# Patient Record
Sex: Male | Born: 1935 | Race: White | Hispanic: No | Marital: Married | Smoking: Former smoker
Health system: Southern US, Community
[De-identification: ages and names within clinical notes are randomized; demographics above are authoritative.]

## PROBLEM LIST (undated history)

## (undated) DIAGNOSIS — M549 Dorsalgia, unspecified: Secondary | ICD-10-CM

## (undated) DIAGNOSIS — Z86718 Personal history of other venous thrombosis and embolism: Secondary | ICD-10-CM

## (undated) DIAGNOSIS — M1990 Unspecified osteoarthritis, unspecified site: Secondary | ICD-10-CM

## (undated) DIAGNOSIS — R350 Frequency of micturition: Secondary | ICD-10-CM

## (undated) DIAGNOSIS — D696 Thrombocytopenia, unspecified: Secondary | ICD-10-CM

## (undated) DIAGNOSIS — I639 Cerebral infarction, unspecified: Secondary | ICD-10-CM

## (undated) DIAGNOSIS — Z8619 Personal history of other infectious and parasitic diseases: Secondary | ICD-10-CM

## (undated) DIAGNOSIS — R011 Cardiac murmur, unspecified: Secondary | ICD-10-CM

## (undated) DIAGNOSIS — R600 Localized edema: Secondary | ICD-10-CM

## (undated) DIAGNOSIS — K5900 Constipation, unspecified: Secondary | ICD-10-CM

## (undated) DIAGNOSIS — R609 Edema, unspecified: Secondary | ICD-10-CM

## (undated) DIAGNOSIS — Z5189 Encounter for other specified aftercare: Secondary | ICD-10-CM

## (undated) DIAGNOSIS — K219 Gastro-esophageal reflux disease without esophagitis: Secondary | ICD-10-CM

## (undated) DIAGNOSIS — G8929 Other chronic pain: Secondary | ICD-10-CM

## (undated) DIAGNOSIS — K922 Gastrointestinal hemorrhage, unspecified: Secondary | ICD-10-CM

## (undated) DIAGNOSIS — Q211 Atrial septal defect: Secondary | ICD-10-CM

## (undated) DIAGNOSIS — Z9289 Personal history of other medical treatment: Secondary | ICD-10-CM

## (undated) DIAGNOSIS — N400 Enlarged prostate without lower urinary tract symptoms: Secondary | ICD-10-CM

## (undated) DIAGNOSIS — N289 Disorder of kidney and ureter, unspecified: Secondary | ICD-10-CM

## (undated) DIAGNOSIS — Z87442 Personal history of urinary calculi: Secondary | ICD-10-CM

## (undated) DIAGNOSIS — I8501 Esophageal varices with bleeding: Secondary | ICD-10-CM

## (undated) DIAGNOSIS — H269 Unspecified cataract: Secondary | ICD-10-CM

## (undated) HISTORY — PX: BACK SURGERY: SHX140

## (undated) HISTORY — PX: COLONOSCOPY: SHX174

## (undated) HISTORY — PX: ESOPHAGOGASTRODUODENOSCOPY: SHX1529

## (undated) HISTORY — PX: FRACTURE SURGERY: SHX138

## (undated) HISTORY — PX: OTHER SURGICAL HISTORY: SHX169

## (undated) HISTORY — PX: FOOT SURGERY: SHX648

---

## 1998-02-17 ENCOUNTER — Encounter: Admission: RE | Admit: 1998-02-17 | Discharge: 1998-05-18 | Payer: Self-pay | Admitting: Anesthesiology

## 1998-04-07 ENCOUNTER — Ambulatory Visit (HOSPITAL_BASED_OUTPATIENT_CLINIC_OR_DEPARTMENT_OTHER): Admission: RE | Admit: 1998-04-07 | Discharge: 1998-04-07 | Payer: Self-pay | Admitting: Orthopedic Surgery

## 1998-05-19 ENCOUNTER — Ambulatory Visit (HOSPITAL_BASED_OUTPATIENT_CLINIC_OR_DEPARTMENT_OTHER): Admission: RE | Admit: 1998-05-19 | Discharge: 1998-05-19 | Payer: Self-pay | Admitting: Orthopedic Surgery

## 1999-03-10 ENCOUNTER — Encounter: Payer: Self-pay | Admitting: Orthopedic Surgery

## 1999-03-10 ENCOUNTER — Ambulatory Visit (HOSPITAL_COMMUNITY): Admission: RE | Admit: 1999-03-10 | Discharge: 1999-03-10 | Payer: Self-pay | Admitting: Orthopedic Surgery

## 1999-03-24 ENCOUNTER — Ambulatory Visit (HOSPITAL_COMMUNITY): Admission: RE | Admit: 1999-03-24 | Discharge: 1999-03-24 | Payer: Self-pay | Admitting: Orthopedic Surgery

## 1999-03-24 ENCOUNTER — Encounter: Payer: Self-pay | Admitting: Orthopedic Surgery

## 1999-04-07 ENCOUNTER — Encounter: Payer: Self-pay | Admitting: Orthopedic Surgery

## 1999-04-07 ENCOUNTER — Ambulatory Visit (HOSPITAL_COMMUNITY): Admission: RE | Admit: 1999-04-07 | Discharge: 1999-04-07 | Payer: Self-pay | Admitting: Orthopedic Surgery

## 1999-08-16 ENCOUNTER — Encounter: Payer: Self-pay | Admitting: Specialist

## 1999-08-16 ENCOUNTER — Inpatient Hospital Stay (HOSPITAL_COMMUNITY): Admission: RE | Admit: 1999-08-16 | Discharge: 1999-08-19 | Payer: Self-pay | Admitting: Specialist

## 1999-10-29 ENCOUNTER — Encounter: Admission: RE | Admit: 1999-10-29 | Discharge: 1999-10-29 | Payer: Self-pay | Admitting: Specialist

## 1999-10-29 ENCOUNTER — Encounter: Payer: Self-pay | Admitting: Specialist

## 1999-11-10 ENCOUNTER — Encounter: Admission: RE | Admit: 1999-11-10 | Discharge: 1999-11-10 | Payer: Self-pay | Admitting: Specialist

## 1999-11-10 ENCOUNTER — Encounter: Payer: Self-pay | Admitting: Specialist

## 1999-12-23 ENCOUNTER — Ambulatory Visit: Admission: RE | Admit: 1999-12-23 | Discharge: 1999-12-23 | Payer: Self-pay | Admitting: Specialist

## 2000-01-06 ENCOUNTER — Ambulatory Visit (HOSPITAL_COMMUNITY): Admission: RE | Admit: 2000-01-06 | Discharge: 2000-01-06 | Payer: Self-pay | Admitting: *Deleted

## 2000-01-09 ENCOUNTER — Ambulatory Visit (HOSPITAL_COMMUNITY): Admission: RE | Admit: 2000-01-09 | Discharge: 2000-01-09 | Payer: Self-pay | Admitting: *Deleted

## 2000-01-09 ENCOUNTER — Encounter: Payer: Self-pay | Admitting: *Deleted

## 2000-02-10 ENCOUNTER — Inpatient Hospital Stay (HOSPITAL_COMMUNITY): Admission: RE | Admit: 2000-02-10 | Discharge: 2000-02-16 | Payer: Self-pay | Admitting: Specialist

## 2000-02-10 ENCOUNTER — Encounter: Payer: Self-pay | Admitting: Specialist

## 2000-02-16 ENCOUNTER — Inpatient Hospital Stay (HOSPITAL_COMMUNITY)
Admission: RE | Admit: 2000-02-16 | Discharge: 2000-02-21 | Payer: Self-pay | Admitting: Physical Medicine & Rehabilitation

## 2000-11-15 ENCOUNTER — Encounter: Admission: RE | Admit: 2000-11-15 | Discharge: 2000-11-15 | Payer: Self-pay | Admitting: Neurosurgery

## 2000-11-15 ENCOUNTER — Encounter: Payer: Self-pay | Admitting: Neurosurgery

## 2001-08-27 ENCOUNTER — Encounter: Payer: Self-pay | Admitting: Family Medicine

## 2001-08-27 ENCOUNTER — Ambulatory Visit (HOSPITAL_COMMUNITY): Admission: RE | Admit: 2001-08-27 | Discharge: 2001-08-27 | Payer: Self-pay | Admitting: Family Medicine

## 2001-09-04 ENCOUNTER — Encounter: Admission: RE | Admit: 2001-09-04 | Discharge: 2001-09-04 | Payer: Self-pay | Admitting: Gastroenterology

## 2001-09-04 ENCOUNTER — Encounter: Payer: Self-pay | Admitting: Gastroenterology

## 2003-06-11 ENCOUNTER — Encounter: Admission: RE | Admit: 2003-06-11 | Discharge: 2003-06-11 | Payer: Self-pay | Admitting: Family Medicine

## 2003-06-18 ENCOUNTER — Encounter: Admission: RE | Admit: 2003-06-18 | Discharge: 2003-06-18 | Payer: Self-pay | Admitting: Family Medicine

## 2005-06-07 ENCOUNTER — Inpatient Hospital Stay (HOSPITAL_COMMUNITY): Admission: RE | Admit: 2005-06-07 | Discharge: 2005-06-09 | Payer: Self-pay | Admitting: Specialist

## 2005-07-31 ENCOUNTER — Encounter: Admission: RE | Admit: 2005-07-31 | Discharge: 2005-07-31 | Payer: Self-pay | Admitting: Specialist

## 2005-10-20 ENCOUNTER — Inpatient Hospital Stay (HOSPITAL_COMMUNITY): Admission: RE | Admit: 2005-10-20 | Discharge: 2005-10-27 | Payer: Self-pay | Admitting: Specialist

## 2005-11-13 ENCOUNTER — Inpatient Hospital Stay (HOSPITAL_COMMUNITY): Admission: AD | Admit: 2005-11-13 | Discharge: 2005-11-19 | Payer: Self-pay | Admitting: Internal Medicine

## 2006-04-05 ENCOUNTER — Encounter: Admission: RE | Admit: 2006-04-05 | Discharge: 2006-04-05 | Payer: Self-pay | Admitting: Specialist

## 2006-05-09 ENCOUNTER — Encounter: Admission: RE | Admit: 2006-05-09 | Discharge: 2006-05-09 | Payer: Self-pay | Admitting: Specialist

## 2010-01-23 ENCOUNTER — Encounter: Payer: Self-pay | Admitting: Specialist

## 2010-09-21 ENCOUNTER — Other Ambulatory Visit: Payer: Self-pay | Admitting: Family Medicine

## 2010-09-21 DIAGNOSIS — R609 Edema, unspecified: Secondary | ICD-10-CM

## 2010-09-22 ENCOUNTER — Ambulatory Visit
Admission: RE | Admit: 2010-09-22 | Discharge: 2010-09-22 | Disposition: A | Discharge status: Home / Self Care | Payer: Medicare Other | Source: Ambulatory Visit | Attending: Family Medicine | Admitting: Family Medicine

## 2010-09-22 DIAGNOSIS — R609 Edema, unspecified: Secondary | ICD-10-CM

## 2011-01-03 HISTORY — PX: NECK SURGERY: SHX720

## 2011-02-06 ENCOUNTER — Telehealth: Payer: Self-pay | Admitting: Internal Medicine

## 2011-02-06 NOTE — Telephone Encounter (Signed)
Referred by Dr. Juluis Rainier Dx- Thrombocytopenia

## 2011-02-08 ENCOUNTER — Other Ambulatory Visit: Payer: Self-pay | Admitting: Orthopaedic Surgery

## 2011-02-08 DIAGNOSIS — M542 Cervicalgia: Secondary | ICD-10-CM

## 2011-02-13 ENCOUNTER — Ambulatory Visit
Admission: RE | Admit: 2011-02-13 | Discharge: 2011-02-13 | Disposition: A | Discharge status: Home / Self Care | Payer: Medicare Other | Source: Ambulatory Visit | Attending: Orthopaedic Surgery | Admitting: Orthopaedic Surgery

## 2011-02-13 DIAGNOSIS — M542 Cervicalgia: Secondary | ICD-10-CM

## 2011-02-22 ENCOUNTER — Other Ambulatory Visit: Payer: Medicare Other | Admitting: Lab

## 2011-02-22 ENCOUNTER — Ambulatory Visit: Payer: Medicare Other

## 2011-02-22 ENCOUNTER — Ambulatory Visit: Payer: Medicare Other | Admitting: Internal Medicine

## 2011-05-15 ENCOUNTER — Other Ambulatory Visit: Payer: Self-pay | Admitting: Orthopaedic Surgery

## 2011-05-15 DIAGNOSIS — M25519 Pain in unspecified shoulder: Secondary | ICD-10-CM

## 2011-05-22 ENCOUNTER — Ambulatory Visit
Admission: RE | Admit: 2011-05-22 | Discharge: 2011-05-22 | Disposition: A | Discharge status: Home / Self Care | Payer: Medicare Other | Source: Ambulatory Visit | Attending: Orthopaedic Surgery | Admitting: Orthopaedic Surgery

## 2011-05-22 DIAGNOSIS — M25519 Pain in unspecified shoulder: Secondary | ICD-10-CM

## 2011-05-31 ENCOUNTER — Other Ambulatory Visit (HOSPITAL_COMMUNITY): Payer: Self-pay | Admitting: Internal Medicine

## 2011-06-09 ENCOUNTER — Inpatient Hospital Stay (HOSPITAL_COMMUNITY): Admission: RE | Admit: 2011-06-09 | Payer: Medicare Other | Source: Ambulatory Visit

## 2011-06-09 ENCOUNTER — Ambulatory Visit (HOSPITAL_COMMUNITY): Payer: Medicare Other

## 2011-07-30 ENCOUNTER — Emergency Department (HOSPITAL_COMMUNITY): Payer: Medicare Other

## 2011-07-30 ENCOUNTER — Encounter (HOSPITAL_COMMUNITY): Payer: Self-pay

## 2011-07-30 ENCOUNTER — Observation Stay (HOSPITAL_COMMUNITY)
Admission: EM | Admit: 2011-07-30 | Discharge: 2011-08-01 | Disposition: A | Discharge status: Skilled Nursing Facility | Payer: Medicare Other | Attending: Internal Medicine | Admitting: Internal Medicine

## 2011-07-30 DIAGNOSIS — Z981 Arthrodesis status: Secondary | ICD-10-CM | POA: Insufficient documentation

## 2011-07-30 DIAGNOSIS — R600 Localized edema: Secondary | ICD-10-CM | POA: Diagnosis present

## 2011-07-30 DIAGNOSIS — R1310 Dysphagia, unspecified: Secondary | ICD-10-CM | POA: Insufficient documentation

## 2011-07-30 DIAGNOSIS — R269 Unspecified abnormalities of gait and mobility: Secondary | ICD-10-CM

## 2011-07-30 DIAGNOSIS — R609 Edema, unspecified: Secondary | ICD-10-CM | POA: Insufficient documentation

## 2011-07-30 DIAGNOSIS — M47812 Spondylosis without myelopathy or radiculopathy, cervical region: Secondary | ICD-10-CM | POA: Insufficient documentation

## 2011-07-30 DIAGNOSIS — S0001XA Abrasion of scalp, initial encounter: Secondary | ICD-10-CM

## 2011-07-30 DIAGNOSIS — N400 Enlarged prostate without lower urinary tract symptoms: Secondary | ICD-10-CM | POA: Diagnosis present

## 2011-07-30 DIAGNOSIS — W010XXA Fall on same level from slipping, tripping and stumbling without subsequent striking against object, initial encounter: Secondary | ICD-10-CM | POA: Insufficient documentation

## 2011-07-30 DIAGNOSIS — F17200 Nicotine dependence, unspecified, uncomplicated: Secondary | ICD-10-CM | POA: Insufficient documentation

## 2011-07-30 DIAGNOSIS — Y92009 Unspecified place in unspecified non-institutional (private) residence as the place of occurrence of the external cause: Secondary | ICD-10-CM | POA: Insufficient documentation

## 2011-07-30 DIAGNOSIS — K219 Gastro-esophageal reflux disease without esophagitis: Secondary | ICD-10-CM | POA: Diagnosis present

## 2011-07-30 DIAGNOSIS — W19XXXA Unspecified fall, initial encounter: Secondary | ICD-10-CM | POA: Diagnosis present

## 2011-07-30 DIAGNOSIS — S42309A Unspecified fracture of shaft of humerus, unspecified arm, initial encounter for closed fracture: Secondary | ICD-10-CM

## 2011-07-30 DIAGNOSIS — S42209A Unspecified fracture of upper end of unspecified humerus, initial encounter for closed fracture: Principal | ICD-10-CM | POA: Insufficient documentation

## 2011-07-30 DIAGNOSIS — IMO0002 Reserved for concepts with insufficient information to code with codable children: Secondary | ICD-10-CM | POA: Insufficient documentation

## 2011-07-30 HISTORY — DX: Unspecified osteoarthritis, unspecified site: M19.90

## 2011-07-30 HISTORY — DX: Encounter for other specified aftercare: Z51.89

## 2011-07-30 HISTORY — DX: Disorder of kidney and ureter, unspecified: N28.9

## 2011-07-30 LAB — CBC WITH DIFFERENTIAL/PLATELET
Basophils Absolute: 0 10*3/uL (ref 0.0–0.1)
Basophils Relative: 1 % (ref 0–1)
Eosinophils Absolute: 0.2 10*3/uL (ref 0.0–0.7)
Eosinophils Relative: 4 % (ref 0–5)
Lymphs Abs: 0.8 10*3/uL (ref 0.7–4.0)
MCH: 32.3 pg (ref 26.0–34.0)
MCV: 96.9 fL (ref 78.0–100.0)
Neutrophils Relative %: 58 % (ref 43–77)
Platelets: 90 10*3/uL — ABNORMAL LOW (ref 150–400)
RBC: 3.25 MIL/uL — ABNORMAL LOW (ref 4.22–5.81)
RDW: 12.9 % (ref 11.5–15.5)

## 2011-07-30 LAB — BASIC METABOLIC PANEL
Calcium: 8.9 mg/dL (ref 8.4–10.5)
GFR calc Af Amer: 75 mL/min — ABNORMAL LOW (ref >90)
GFR calc non Af Amer: 64 mL/min — ABNORMAL LOW (ref >90)
Glucose, Bld: 106 mg/dL — ABNORMAL HIGH (ref 70–99)
Potassium: 3.6 mEq/L (ref 3.5–5.1)
Sodium: 144 mEq/L (ref 135–145)

## 2011-07-30 LAB — URINALYSIS, ROUTINE W REFLEX MICROSCOPIC
Leukocytes, UA: NEGATIVE
Nitrite: NEGATIVE
Protein, ur: NEGATIVE mg/dL
Specific Gravity, Urine: 1.011 (ref 1.005–1.030)
Urobilinogen, UA: 1 mg/dL (ref 0.0–1.0)

## 2011-07-30 MED ORDER — MAGNESIUM HYDROXIDE 400 MG/5ML PO SUSP: 30.0000 mL | Freq: Every day | ORAL | Status: DC | PRN

## 2011-07-30 MED ORDER — ACETAMINOPHEN-CODEINE #3 300-30 MG PO TABS
1.0000 | ORAL_TABLET | ORAL | Status: DC | PRN
  Administered 2011-07-30 – 2011-07-31 (×2): 1 via ORAL
  Filled 2011-07-30 (×2): qty 1

## 2011-07-30 MED ORDER — GABAPENTIN 100 MG PO CAPS
100.0000 mg | ORAL_CAPSULE | Freq: Two times a day (BID) | ORAL | Status: DC
  Administered 2011-07-30 – 2011-08-01 (×5): 100 mg via ORAL
  Filled 2011-07-30 (×6): qty 1

## 2011-07-30 MED ORDER — FENTANYL CITRATE 0.05 MG/ML IJ SOLN
50.0000 ug | INTRAMUSCULAR | Status: DC | PRN
  Filled 2011-07-30: qty 2

## 2011-07-30 MED ORDER — B COMPLEX PO TABS: 1.0000 | ORAL_TABLET | Freq: Every day | ORAL | Status: DC

## 2011-07-30 MED ORDER — DOCUSATE SODIUM 100 MG PO CAPS
100.0000 mg | ORAL_CAPSULE | Freq: Two times a day (BID) | ORAL | Status: DC
  Administered 2011-07-30 – 2011-08-01 (×3): 100 mg via ORAL
  Filled 2011-07-30 (×4): qty 1

## 2011-07-30 MED ORDER — PANTOPRAZOLE SODIUM 40 MG PO TBEC
40.0000 mg | DELAYED_RELEASE_TABLET | Freq: Every day | ORAL | Status: DC
  Administered 2011-07-30 – 2011-08-01 (×3): 40 mg via ORAL
  Filled 2011-07-30 (×3): qty 1

## 2011-07-30 MED ORDER — MORPHINE SULFATE 2 MG/ML IJ SOLN
1.0000 mg | INTRAMUSCULAR | Status: DC | PRN
  Administered 2011-07-30 – 2011-07-31 (×4): 1 mg via INTRAVENOUS
  Filled 2011-07-30 (×4): qty 1

## 2011-07-30 MED ORDER — SODIUM CHLORIDE 0.9 % IV SOLN
INTRAVENOUS | Status: AC
  Administered 2011-07-30: 12:00:00 via INTRAVENOUS

## 2011-07-30 MED ORDER — SENNA 8.6 MG PO TABS
1.0000 | ORAL_TABLET | Freq: Two times a day (BID) | ORAL | Status: DC
  Administered 2011-07-30 – 2011-08-01 (×5): 8.6 mg via ORAL
  Filled 2011-07-30 (×6): qty 1

## 2011-07-30 MED ORDER — TAMSULOSIN HCL 0.4 MG PO CAPS
0.4000 mg | ORAL_CAPSULE | Freq: Every day | ORAL | Status: DC
  Administered 2011-07-30 – 2011-07-31 (×2): 0.4 mg via ORAL
  Filled 2011-07-30 (×3): qty 1

## 2011-07-30 MED ORDER — FUROSEMIDE 40 MG PO TABS
40.0000 mg | ORAL_TABLET | Freq: Every day | ORAL | Status: DC
  Administered 2011-07-30 – 2011-08-01 (×3): 40 mg via ORAL
  Filled 2011-07-30 (×3): qty 1

## 2011-07-30 MED ORDER — B COMPLEX-C PO TABS
1.0000 | ORAL_TABLET | Freq: Every day | ORAL | Status: DC
  Administered 2011-07-30 – 2011-08-01 (×3): 1 via ORAL
  Filled 2011-07-30 (×3): qty 1

## 2011-07-30 MED ORDER — HEPARIN SODIUM (PORCINE) 5000 UNIT/ML IJ SOLN
5000.0000 [IU] | Freq: Three times a day (TID) | INTRAMUSCULAR | Status: DC
  Administered 2011-07-30 – 2011-07-31 (×5): 5000 [IU] via SUBCUTANEOUS
  Filled 2011-07-30 (×9): qty 1

## 2011-07-30 MED ORDER — ONDANSETRON HCL 4 MG/2ML IJ SOLN: 4.0000 mg | Freq: Four times a day (QID) | INTRAMUSCULAR | Status: DC | PRN

## 2011-07-30 MED ORDER — ALUM & MAG HYDROXIDE-SIMETH 200-200-20 MG/5ML PO SUSP: 30.0000 mL | Freq: Four times a day (QID) | ORAL | Status: DC | PRN

## 2011-07-30 MED ORDER — ONDANSETRON HCL 4 MG PO TABS: 4.0000 mg | ORAL_TABLET | Freq: Four times a day (QID) | ORAL | Status: DC | PRN

## 2011-07-30 MED ORDER — FENTANYL CITRATE 0.05 MG/ML IJ SOLN
50.0000 ug | Freq: Once | INTRAMUSCULAR | Status: AC
  Administered 2011-07-30: 50 ug via INTRAVENOUS
  Filled 2011-07-30: qty 2

## 2011-07-30 NOTE — Consult Note (Signed)
Reason for Consult:  Right proximal humerus fracture Referring Physician:   [facility]  Brandon Hardy is an 78 y.o. male.  HPI:   78 yo right-handed male sustained an accidental mechanical fall today and injured his right shoulder.  He was recently discharged from a rehab facility and ambulates with a walker.  He has considerable right shoulder pain and denies other injuries.  His son is at the bedside.  He is admitted for therapy purposes given his difficulty with mobility.  Past Medical History  Diagnosis Date  . Arthritis   . Blood transfusion   . Renal disorder     Past Surgical History  Procedure Date  . Back surgery   . Fracture surgery     No family history on file.  Social History:  reports that he has been smoking.  He does not have any smokeless tobacco history on file. He reports that he does not drink alcohol or use illicit drugs.  Allergies: No Known Allergies  Medications: I have reviewed the patient's current medications.  Results for orders placed during the hospital encounter of 07/30/11 (from the past 48 hour(s))  CBC WITH DIFFERENTIAL     Status: Abnormal   Collection Time   07/30/11  8:04 AM      Component Value Range Comment   WBC 3.7 (*) 4.0 - 10.5 K/uL    RBC 3.25 (*) 4.22 - 5.81 MIL/uL    Hemoglobin 10.5 (*) 13.0 - 17.0 g/dL    HCT 29.5 (*) 62.1 - 52.0 %    MCV 96.9  78.0 - 100.0 fL    MCH 32.3  26.0 - 34.0 pg    MCHC 33.3  30.0 - 36.0 g/dL    RDW 30.8  65.7 - 84.6 %    Platelets 90 (*) 150 - 400 K/uL PLATELET COUNT CONFIRMED BY SMEAR   Neutrophils Relative 58  43 - 77 %    Neutro Abs 2.1  1.7 - 7.7 K/uL    Lymphocytes Relative 22  12 - 46 %    Lymphs Abs 0.8  0.7 - 4.0 K/uL    Monocytes Relative 15 (*) 3 - 12 %    Monocytes Absolute 0.6  0.1 - 1.0 K/uL    Eosinophils Relative 4  0 - 5 %    Eosinophils Absolute 0.2  0.0 - 0.7 K/uL    Basophils Relative 1  0 - 1 %    Basophils Absolute 0.0  0.0 - 0.1 K/uL   BASIC METABOLIC PANEL      Status: Abnormal   Collection Time   07/30/11  8:04 AM      Component Value Range Comment   Sodium 144  135 - 145 mEq/L    Potassium 3.6  3.5 - 5.1 mEq/L    Chloride 107  96 - 112 mEq/L    CO2 30  19 - 32 mEq/L    Glucose, Bld 106 (*) 70 - 99 mg/dL    BUN 10  6 - 23 mg/dL    Creatinine, Ser 9.62  0.50 - 1.35 mg/dL    Calcium 8.9  8.4 - 95.2 mg/dL    GFR calc non Af Amer 64 (*) >90 mL/min    GFR calc Af Amer 75 (*) >90 mL/min   URINALYSIS, ROUTINE W REFLEX MICROSCOPIC     Status: Normal   Collection Time   07/30/11 10:26 AM      Component Value Range Comment   Color, Urine YELLOW  YELLOW    APPearance CLEAR  CLEAR    Specific Gravity, Urine 1.011  1.005 - 1.030    pH 7.5  5.0 - 8.0    Glucose, UA NEGATIVE  NEGATIVE mg/dL    Hgb urine dipstick NEGATIVE  NEGATIVE    Bilirubin Urine NEGATIVE  NEGATIVE    Ketones, ur NEGATIVE  NEGATIVE mg/dL    Protein, ur NEGATIVE  NEGATIVE mg/dL    Urobilinogen, UA 1.0  0.0 - 1.0 mg/dL    Nitrite NEGATIVE  NEGATIVE    Leukocytes, UA NEGATIVE  NEGATIVE MICROSCOPIC NOT DONE ON URINES WITH NEGATIVE PROTEIN, BLOOD, LEUKOCYTES, NITRITE, OR GLUCOSE <1000 mg/dL.    Dg Chest 2 View  07/30/2011  *RADIOLOGY REPORT*  Clinical Data: Fall, shoulder pain  CHEST - 2 VIEW  Comparison: 11/09/2010  Findings: Borderline cardiomegaly again noted.  No acute infiltrate or pleural effusion.  No pulmonary edema.  Visualized bony structures are unremarkable.  IMPRESSION: No active disease.  No significant change.  Original Report Authenticated By: Natasha Mead, M.D.   Dg Pelvis 1-2 Views  07/30/2011  *RADIOLOGY REPORT*  Clinical Data: Shoulder pain, fall  PELVIS - 1-2 VIEW  Comparison: 01/07/2009  Findings: Single frontal view of the pelvis submitted.  No acute fracture or subluxation.  There is diffuse osteopenia.  Stable postsurgical changes of the lumbosacral spine.  IMPRESSION: Diffuse osteopenia.  No acute fracture or subluxation.  Original Report Authenticated By: Natasha Mead, M.D.   Dg Shoulder Right  07/30/2011  *RADIOLOGY REPORT*  Clinical Data: Fall, shoulder pain  RIGHT SHOULDER - 2+ VIEW  Comparison: None.  Findings: Two views of the right shoulder submitted.  There is mild displaced fracture with cortical step-off right humeral neck.  Mild degenerative changes right AC joint. Dystrophic calcifications are noted above distal right clavicle.  IMPRESSION: Mild displaced fracture right humeral neck.  Original Report Authenticated By: Natasha Mead, M.D.   Dg Elbow Complete Right  07/30/2011  *RADIOLOGY REPORT*  Clinical Data: Fall, shoulder pain  RIGHT ELBOW - COMPLETE 3+ VIEW  Comparison: None.  Findings: Four views of the right elbow submitted.  No acute fracture or subluxation.  No posterior fat pad sign.  IMPRESSION: No acute fracture or subluxation.  Original Report Authenticated By: Natasha Mead, M.D.   Ct Head Wo Contrast  07/30/2011  *RADIOLOGY REPORT*  Clinical Data:  78 year old male with fall with head and neck injury.  CT HEAD WITHOUT CONTRAST CT CERVICAL SPINE WITHOUT CONTRAST  Technique:  Multidetector CT imaging of the head and cervical spine was performed following the standard protocol without intravenous contrast.  Multiplanar CT image reconstructions of the cervical spine were also generated.  Comparison:  02/13/2011 cervical spine MRI.  CT HEAD  Findings: Mild to moderate atrophy and chronic small vessel white matter ischemic changes are noted.  No acute intracranial abnormalities are identified, including mass lesion or mass effect, hydrocephalus, extra-axial fluid collection, midline shift, hemorrhage, or acute infarction.  The visualized bony calvarium is unremarkable.  IMPRESSION: No evidence of acute intracranial abnormality.  Atrophy and chronic small vessel white matter ischemic changes.  CT CERVICAL SPINE  Findings: Anterior plate/screw fixation and interbody fusion material at C3-C4-C5-C6-C7 is noted. There is no evidence of acute fracture,  subluxation or prevertebral soft tissue swelling. No complicating hardware features are identified. Mild facet arthropathy throughout the cervical spine identified. This contributes to bony foraminal narrowing at several levels. No focal bony lesions are present.  IMPRESSION: No static evidence of acute injury  to the cervical spine.  Anterior/interbody fusion from C3-C7 without definite complicating features.  Multilevel facet arthropathy contributing to bony foraminal narrowing at several levels.  Original Report Authenticated By: Rosendo Gros, M.D.   Ct Cervical Spine Wo Contrast  07/30/2011  *RADIOLOGY REPORT*  Clinical Data:  78 year old male with fall with head and neck injury.  CT HEAD WITHOUT CONTRAST CT CERVICAL SPINE WITHOUT CONTRAST  Technique:  Multidetector CT imaging of the head and cervical spine was performed following the standard protocol without intravenous contrast.  Multiplanar CT image reconstructions of the cervical spine were also generated.  Comparison:  02/13/2011 cervical spine MRI.  CT HEAD  Findings: Mild to moderate atrophy and chronic small vessel white matter ischemic changes are noted.  No acute intracranial abnormalities are identified, including mass lesion or mass effect, hydrocephalus, extra-axial fluid collection, midline shift, hemorrhage, or acute infarction.  The visualized bony calvarium is unremarkable.  IMPRESSION: No evidence of acute intracranial abnormality.  Atrophy and chronic small vessel white matter ischemic changes.  CT CERVICAL SPINE  Findings: Anterior plate/screw fixation and interbody fusion material at C3-C4-C5-C6-C7 is noted. There is no evidence of acute fracture, subluxation or prevertebral soft tissue swelling. No complicating hardware features are identified. Mild facet arthropathy throughout the cervical spine identified. This contributes to bony foraminal narrowing at several levels. No focal bony lesions are present.  IMPRESSION: No static  evidence of acute injury to the cervical spine.  Anterior/interbody fusion from C3-C7 without definite complicating features.  Multilevel facet arthropathy contributing to bony foraminal narrowing at several levels.  Original Report Authenticated By: Rosendo Gros, M.D.   Dg Humerus Right  07/30/2011  *RADIOLOGY REPORT*  Clinical Data: Fall, shoulder pain  RIGHT HUMERUS - 2+ VIEW  Comparison: Right shoulder same day  Findings: Two views of the right humerus submitted.  There is mild displaced fracture proximal right humeral metaphysis.  IMPRESSION: Mild displaced fracture of proximal right humerus.  Original Report Authenticated By: Natasha Mead, M.D.    ROS Blood pressure 129/62, pulse 49, temperature 98 F (36.7 C), temperature source Oral, resp. rate 16, SpO2 100.00%. Physical Exam  Musculoskeletal:       Right shoulder: He exhibits decreased range of motion, bony tenderness and deformity.    Assessment/Plan: Right proximal humerus fracture with minimal displacement 1) sling for comfort; can flex/ext at elbow 2) PT/OT consults for mobility and ADL's; try platform walker  Brandon Hardy Y 07/30/2011, 1:46 PM

## 2011-07-30 NOTE — ED Provider Notes (Signed)
History     CSN: [id number]  Arrival date & time 07/30/11  0725   First MD Initiated Contact with Patient 07/30/11 [phone]      Chief Complaint  Patient presents with  . Fall  . Shoulder Pain    (Consider location/radiation/quality/duration/timing/severity/associated sxs/prior treatment) HPI Comments: Patient reports he was walking with his walker when he slipped and fell, injuring his right shoulder and the back of his head.  Pt reports he has a "hole in his head" but does not have pain in his head.  Only pain pt complains of is his right arm - he is unsure of the exact spot but notes it hurts when he moves.  Does note he occasionally has pain in his pelvis, "right in the middle."  Pain improved with fentanyl given by EMS.  Pt denies lightheadedness, dizziness, syncope, CP, SOB, abdominal pain, N/V/D, urinary symptoms.  Son reports this is patient's 2nd fall in three weeks since he has been home from the rehab facility.   Pt has hx low platelets.   Patient is a 78 y.o. male presenting with fall and shoulder pain. The history is provided by the patient and a relative.  Fall Pertinent negatives include no fever, no numbness, no abdominal pain, no nausea and no vomiting.  Shoulder Pain Pertinent negatives include no abdominal pain, chest pain, coughing, fever, nausea, neck pain, numbness, vomiting or weakness.    Past Medical History  Diagnosis Date  . Arthritis   . Blood transfusion   . Renal disorder     Past Surgical History  Procedure Date  . Back surgery   . Fracture surgery     No family history on file.  History  Substance Use Topics  . Smoking status: Current Everyday Smoker  . Smokeless tobacco: Not on file  . Alcohol Use: No      Review of Systems  Constitutional: Negative for fever.  HENT: Negative for neck pain.   Respiratory: Negative for cough and shortness of breath.   Cardiovascular: Negative for chest pain.  Gastrointestinal: Negative for nausea,  vomiting, abdominal pain and diarrhea.  Musculoskeletal: Negative for back pain.  Neurological: Negative for dizziness, weakness, light-headedness and numbness.  Psychiatric/Behavioral: Negative for confusion.  All other systems reviewed and are negative.    Allergies  Review of patient's allergies indicates not on file.  Home Medications  No current outpatient prescriptions on file.  BP 124/56  Pulse 54  Temp 97.8 F (36.6 C) (Oral)  Resp 14  SpO2 100%  Physical Exam  Nursing note and vitals reviewed. Constitutional: He appears well-developed and well-nourished. No distress.  HENT:  Head: Normocephalic. Head is with abrasion.  Neck: Neck supple.  Cardiovascular: Normal rate, regular rhythm and intact distal pulses.        Bilateral lower extremity edema.    Pulmonary/Chest: Effort normal and breath sounds normal. No respiratory distress. He has no wheezes. He has no rales. He exhibits no tenderness.  Abdominal: Soft. He exhibits no distension and no mass. There is no tenderness. There is no rebound and no guarding.  Musculoskeletal:       Right shoulder: He exhibits decreased range of motion.       Right elbow: He exhibits decreased range of motion. no tenderness found.       Right upper arm: He exhibits tenderness.       Right arm with decreased ROM secondary to pain.  Right humerus tender.  Distal pulses intact, sensation intact.  Neurological: He is alert. He has normal strength. No sensory deficit. He exhibits normal muscle tone. GCS eye subscore is 4. GCS verbal subscore is 5. GCS motor subscore is 6.       Moves arms and legs on command.  Lower extremities:  Strength 5/5, sensation intact.  Skin: He is not diaphoretic.  Psychiatric: He has a normal mood and affect.    ED Course  Procedures (including critical care time)  Labs Reviewed  CBC WITH DIFFERENTIAL - Abnormal; Notable for the following:    WBC 3.7 (*)     RBC 3.25 (*)     Hemoglobin 10.5 (*)     HCT  31.5 (*)     Platelets 90 (*)  PLATELET COUNT CONFIRMED BY SMEAR   Monocytes Relative 15 (*)     All other components within normal limits  BASIC METABOLIC PANEL - Abnormal; Notable for the following:    Glucose, Bld 106 (*)     GFR calc non Af Amer 64 (*)     GFR calc Af Amer 75 (*)     All other components within normal limits  URINALYSIS, ROUTINE W REFLEX MICROSCOPIC  URINE CULTURE   Dg Chest 2 View  07/30/2011  *RADIOLOGY REPORT*  Clinical Data: Fall, shoulder pain  CHEST - 2 VIEW  Comparison: 11/09/2010  Findings: Borderline cardiomegaly again noted.  No acute infiltrate or pleural effusion.  No pulmonary edema.  Visualized bony structures are unremarkable.  IMPRESSION: No active disease.  No significant change.  Original Report Authenticated By: Natasha Mead, M.D.   Dg Pelvis 1-2 Views  07/30/2011  *RADIOLOGY REPORT*  Clinical Data: Shoulder pain, fall  PELVIS - 1-2 VIEW  Comparison: 01/07/2009  Findings: Single frontal view of the pelvis submitted.  No acute fracture or subluxation.  There is diffuse osteopenia.  Stable postsurgical changes of the lumbosacral spine.  IMPRESSION: Diffuse osteopenia.  No acute fracture or subluxation.  Original Report Authenticated By: Natasha Mead, M.D.   Dg Shoulder Right  07/30/2011  *RADIOLOGY REPORT*  Clinical Data: Fall, shoulder pain  RIGHT SHOULDER - 2+ VIEW  Comparison: None.  Findings: Two views of the right shoulder submitted.  There is mild displaced fracture with cortical step-off right humeral neck.  Mild degenerative changes right AC joint. Dystrophic calcifications are noted above distal right clavicle.  IMPRESSION: Mild displaced fracture right humeral neck.  Original Report Authenticated By: Natasha Mead, M.D.   Dg Elbow Complete Right  07/30/2011  *RADIOLOGY REPORT*  Clinical Data: Fall, shoulder pain  RIGHT ELBOW - COMPLETE 3+ VIEW  Comparison: None.  Findings: Four views of the right elbow submitted.  No acute fracture or subluxation.  No  posterior fat pad sign.  IMPRESSION: No acute fracture or subluxation.  Original Report Authenticated By: Natasha Mead, M.D.   Ct Head Wo Contrast  07/30/2011  *RADIOLOGY REPORT*  Clinical Data:  78 year old male with fall with head and neck injury.  CT HEAD WITHOUT CONTRAST CT CERVICAL SPINE WITHOUT CONTRAST  Technique:  Multidetector CT imaging of the head and cervical spine was performed following the standard protocol without intravenous contrast.  Multiplanar CT image reconstructions of the cervical spine were also generated.  Comparison:  02/13/2011 cervical spine MRI.  CT HEAD  Findings: Mild to moderate atrophy and chronic small vessel white matter ischemic changes are noted.  No acute intracranial abnormalities are identified, including mass lesion or mass effect, hydrocephalus, extra-axial fluid collection, midline shift, hemorrhage, or acute infarction.  The visualized  bony calvarium is unremarkable.  IMPRESSION: No evidence of acute intracranial abnormality.  Atrophy and chronic small vessel white matter ischemic changes.  CT CERVICAL SPINE  Findings: Anterior plate/screw fixation and interbody fusion material at C3-C4-C5-C6-C7 is noted. There is no evidence of acute fracture, subluxation or prevertebral soft tissue swelling. No complicating hardware features are identified. Mild facet arthropathy throughout the cervical spine identified. This contributes to bony foraminal narrowing at several levels. No focal bony lesions are present.  IMPRESSION: No static evidence of acute injury to the cervical spine.  Anterior/interbody fusion from C3-C7 without definite complicating features.  Multilevel facet arthropathy contributing to bony foraminal narrowing at several levels.  Original Report Authenticated By: Rosendo Gros, M.D.   Ct Cervical Spine Wo Contrast  07/30/2011  *RADIOLOGY REPORT*  Clinical Data:  78 year old male with fall with head and neck injury.  CT HEAD WITHOUT CONTRAST CT CERVICAL  SPINE WITHOUT CONTRAST  Technique:  Multidetector CT imaging of the head and cervical spine was performed following the standard protocol without intravenous contrast.  Multiplanar CT image reconstructions of the cervical spine were also generated.  Comparison:  02/13/2011 cervical spine MRI.  CT HEAD  Findings: Mild to moderate atrophy and chronic small vessel white matter ischemic changes are noted.  No acute intracranial abnormalities are identified, including mass lesion or mass effect, hydrocephalus, extra-axial fluid collection, midline shift, hemorrhage, or acute infarction.  The visualized bony calvarium is unremarkable.  IMPRESSION: No evidence of acute intracranial abnormality.  Atrophy and chronic small vessel white matter ischemic changes.  CT CERVICAL SPINE  Findings: Anterior plate/screw fixation and interbody fusion material at C3-C4-C5-C6-C7 is noted. There is no evidence of acute fracture, subluxation or prevertebral soft tissue swelling. No complicating hardware features are identified. Mild facet arthropathy throughout the cervical spine identified. This contributes to bony foraminal narrowing at several levels. No focal bony lesions are present.  IMPRESSION: No static evidence of acute injury to the cervical spine.  Anterior/interbody fusion from C3-C7 without definite complicating features.  Multilevel facet arthropathy contributing to bony foraminal narrowing at several levels.  Original Report Authenticated By: Rosendo Gros, M.D.   Dg Humerus Right  07/30/2011  *RADIOLOGY REPORT*  Clinical Data: Fall, shoulder pain  RIGHT HUMERUS - 2+ VIEW  Comparison: Right shoulder same day  Findings: Two views of the right humerus submitted.  There is mild displaced fracture proximal right humeral metaphysis.  IMPRESSION: Mild displaced fracture of proximal right humerus.  Original Report Authenticated By: Natasha Mead, M.D.    7:48 AM Pt seen and examined, labs, xrays, CTs ordered.  Pt declines pain  medication at this time.    7:54 AM Dr Lorenso Courier made aware of patient.    1. Dysphagia   2. Fall   3. Humeral fracture   4. Scalp abrasion       MDM  Pt with slip and fall at home.  Pt walks with a walker.  Sustained right humeral fracture that is nonsurgical but pt will not be able to ambulate with walker given this break.  Labs otherwise show known pancytopenia.  Patient unable to manage at home given this situation.  I spoke with Dr Magnus Ivan, orthopedics, who recommends sling and follow up in the office in 1-2 weeks.  I also spoke with the social worker French Ana, who will begin working on placement.  States that pt needs inpatient admission and PT/OT consult for recommendations.  I then admitted the patient to [facility] and have  discussed these other conversations with the hospitalist who will admit the patient.            [place], [place] 07/30/11 1605

## 2011-07-30 NOTE — Progress Notes (Signed)
Orthopedic Tech Progress Note Patient Details:  Brandon Hardy April 16, 1927 [id number]  Ortho Devices Type of Ortho Device: Arm foam sling Ortho Device/Splint Interventions: Application   Cammer, Mickie Bail 07/30/2011, 10:12 AM

## 2011-07-30 NOTE — ED Provider Notes (Signed)
History     CSN: [id number]  Arrival date & time 07/30/11  0725   First MD Initiated Contact with Patient 07/30/11 [phone]      Chief Complaint  Patient presents with  . Fall  . Shoulder Pain    (Consider location/radiation/quality/duration/timing/severity/associated sxs/prior treatment) HPI  Past Medical History  Diagnosis Date  . Arthritis   . Blood transfusion   . Renal disorder     Past Surgical History  Procedure Date  . Back surgery   . Fracture surgery     No family history on file.  History  Substance Use Topics  . Smoking status: Current Everyday Smoker  . Smokeless tobacco: Not on file  . Alcohol Use: No      Review of Systems  Allergies  Review of patient's allergies indicates no known allergies.  Home Medications   Current Outpatient Rx  Name Route Sig Dispense Refill  . ACETAMINOPHEN-CODEINE #3 300-30 MG PO TABS Oral Take 1 tablet by mouth every 4 (four) hours as needed. For pain    . B COMPLEX PO TABS Oral Take 1 tablet by mouth daily.    . FUROSEMIDE 40 MG PO TABS Oral Take 40 mg by mouth daily.    Marland Kitchen GABAPENTIN 100 MG PO CAPS Oral Take 100 mg by mouth 2 (two) times daily.    Marland Kitchen OMEPRAZOLE 20 MG PO CPDR Oral Take 20 mg by mouth daily.    Marland Kitchen TAMSULOSIN HCL 0.4 MG PO CAPS Oral Take by mouth daily.      BP 124/56  Pulse 54  Temp 97.8 F (36.6 C) (Oral)  Resp 14  SpO2 100%  Physical Exam  ED Course  Procedures (including critical care time)   Labs Reviewed  CBC WITH DIFFERENTIAL  BASIC METABOLIC PANEL  URINALYSIS, ROUTINE W REFLEX MICROSCOPIC   No results found.   No diagnosis found.    MDM  0800.  Pt is being evaluated primarily by PA. West.  I obtained history and performed physical exam also.  Mr. Deremer describes a mechanical fall this morning while walking from the bathroom.  He complains mostly of pain in his rt arm/shoulder and fears it may be broken.  On exam he is alert, oriented, and pleasant - no distress noted.  He  has reproducible rt proximal humerus pain.  There is a ccollar in place and a small occipital wound is noted.  Although he has no neck pain, will await imaging and perform serial exams prior to removing collar.  There are also pending routine screening labs.        Tobin Chad, MD 07/30/11 [phone]

## 2011-07-30 NOTE — ED Notes (Signed)
Initially charted that pt had a lac to the back of the head.  Upon inspection there is no lac.

## 2011-07-30 NOTE — H&P (Signed)
[facility]                                                                                    Patient Demographics  Brandon Hardy, is a 78 y.o. male  CSN: [id number]  MRN: [id number]  DOB - 1927-11-02  Admit Date - 07/30/2011  Outpatient Primary MD for the patient is Gaye Alken, MD   With History of -  Past Medical History  Diagnosis Date  . Arthritis   . Blood transfusion   . Renal disorder       Past Surgical History  Procedure Date  . Back surgery   . Fracture surgery     in for   Chief Complaint  Patient presents with  . Fall  . Shoulder Pain     HPI  Brandon Hardy  is a 78 y.o. male, who was discharged recently from skilled nursing facility, where he was for subacute rehabilitation for total of 3 months secondary to recent surgical surgery, patient has been discharged on home with a walker, and reports he fell today in the bathroom, patient reports he tripped on the worker, denies any altered mental status loss of consciousness near syncope syncope lightheadedness or dizziness preceded the fall, patient reports he fell at his back where he hit his right side where he started complaining of right shoulder pain since then and had some mild laceration in the scalp. In ED patient had air CT head and neck which did not show any acute fracture or subluxation, is right shoulder x-ray did show mildly displaced humerus fracture, hospitalist service we are requested to admit the patient for for need of placement as patient can't use his walker with a fractured humerus, as well for orthopedic evaluation for management of his fracture.    Review of Systems    In addition to the HPI above,  No Fever-chills, recent fall No Headache, No changes with Vision or hearing, No problems swallowing food or Liquids, No Chest pain, Cough or Shortness of Breath, No Abdominal pain, No Nausea or Vommitting, Bowel movements are regular, No Blood in stool or  Urine, No dysuria, No new skin rashes or bruises, No new joints pains-aches, complaints of right arm pain and. No new weakness, tingling, numbness in any extremity, No recent weight gain or loss, No polyuria, polydypsia or polyphagia, No significant Mental Stressors.  A full 10 point Review of Systems was done, except as stated above, all other Review of Systems were negative.   Social History No hx of ETOH use, quit smoking 35 years ago.  Family History No family history on file.   Prior to Admission medications   Medication Sig Start Date End Date Taking? Authorizing Provider  acetaminophen-codeine (TYLENOL #3) 300-30 MG per tablet Take 1 tablet by mouth every 4 (four) hours as needed. For pain   Yes Historical Provider, MD  b complex vitamins tablet Take 1 tablet by mouth daily.   Yes Historical Provider, MD  furosemide (LASIX) 40 MG tablet Take 40 mg by mouth daily.   Yes Historical Provider, MD  gabapentin (NEURONTIN) 100 MG capsule Take 100 mg by mouth 2 (two) times daily.  Yes Historical Provider, MD  omeprazole (PRILOSEC) 20 MG capsule Take 20 mg by mouth daily.   Yes Historical Provider, MD  Tamsulosin HCl (FLOMAX) 0.4 MG CAPS Take by mouth daily.   Yes Historical Provider, MD    No Known Allergies  Physical Exam  Vitals  Blood pressure 116/46, pulse 55, temperature 97.8 F (36.6 C), temperature source Oral, resp. rate 16, SpO2 99.00%.   1. General elderly male lying in bed in NAD,  2. Normal affect and insight, Not Suicidal or Homicidal, Awake Alert, Oriented X 3.  3. No F.N deficits, ALL C.Nerves Intact, Strength 5/5 all 4 extremities, but limited in right upper extremity due to pain, Sensation intact all 4 extremities, Plantars down going.  4. Ears and Eyes appear Normal, Conjunctivae clear, PERRLA. Moist Oral Mucosa.  5. Supple Neck, No JVD, No cervical lymphadenopathy appriciated, No Carotid Bruits.  6. Symmetrical Chest wall movement, Good air movement  bilaterally, CTAB.  7. RRR, No Gallops, Rubs or Murmurs, No Parasternal Heave.  8. Positive Bowel Sounds, Abdomen Soft, Non tender, No organomegaly appriciated,No rebound -guarding or rigidity.  9.  No Cyanosis, Normal Skin Turgor, No Skin Rash or Bruise.  10. Good muscle tone,  joints appear normal , no effusions, bilateral lower extremity edema +1 to +2, didn't decreased range of motion in right arm secondary to numerous fractures, has a sling.  11. No Palpable Lymph Nodes in Neck or Axillae   Data Review  CBC  Lab 07/30/11 0804  WBC 3.7*  HGB 10.5*  HCT 31.5*  PLT 90*  MCV 96.9  MCH 32.3  MCHC 33.3  RDW 12.9  LYMPHSABS 0.8  MONOABS 0.6  EOSABS 0.2  BASOSABS 0.0  BANDABS --   ------------------------------------------------------------------------------------------------------------------  Chemistries   Lab 07/30/11 0804  NA 144  K 3.6  CL 107  CO2 30  GLUCOSE 106*  BUN 10  CREATININE 1.05  CALCIUM 8.9  MG --  AST --  ALT --  ALKPHOS --  BILITOT --   ------------------------------------------------------------------------------------------------------------------ CrCl is unknown because there is no height on file for the current visit. ------------------------------------------------------------------------------------------------------------------ No results found for this basename: TSH,T4TOTAL,FREET3,T3FREE,THYROIDAB in the last 72 hours   Coagulation profile No results found for this basename: INR:5,PROTIME:5 in the last 168 hours ------------------------------------------------------------------------------------------------------------------- No results found for this basename: DDIMER:2 in the last 72 hours -------------------------------------------------------------------------------------------------------------------  Cardiac Enzymes No results found for this basename: CK:3,CKMB:3,TROPONINI:3,MYOGLOBIN:3 in the last 168  hours ------------------------------------------------------------------------------------------------------------------ No components found with this basename: POCBNP:3   ---------------------------------------------------------------------------------------------------------------  Urinalysis    Component Value Date/Time   COLORURINE YELLOW 07/30/2011 1026   APPEARANCEUR CLEAR 07/30/2011 1026   LABSPEC 1.011 07/30/2011 1026   PHURINE 7.5 07/30/2011 1026   GLUCOSEU NEGATIVE 07/30/2011 1026   HGBUR NEGATIVE 07/30/2011 1026   BILIRUBINUR NEGATIVE 07/30/2011 1026   KETONESUR NEGATIVE 07/30/2011 1026   PROTEINUR NEGATIVE 07/30/2011 1026   UROBILINOGEN 1.0 07/30/2011 1026   NITRITE NEGATIVE 07/30/2011 1026   LEUKOCYTESUR NEGATIVE 07/30/2011 1026    ----------------------------------------------------------------------------------------------------------------    Imaging results:   Dg Chest 2 View  07/30/2011  *RADIOLOGY REPORT*  Clinical Data: Fall, shoulder pain  CHEST - 2 VIEW  Comparison: 11/09/2010  Findings: Borderline cardiomegaly again noted.  No acute infiltrate or pleural effusion.  No pulmonary edema.  Visualized bony structures are unremarkable.  IMPRESSION: No active disease.  No significant change.  Original Report Authenticated By: Natasha Mead, M.D.   Dg Pelvis 1-2 Views  07/30/2011  *RADIOLOGY REPORT*  Clinical Data:  Shoulder pain, fall  PELVIS - 1-2 VIEW  Comparison: 01/07/2009  Findings: Single frontal view of the pelvis submitted.  No acute fracture or subluxation.  There is diffuse osteopenia.  Stable postsurgical changes of the lumbosacral spine.  IMPRESSION: Diffuse osteopenia.  No acute fracture or subluxation.  Original Report Authenticated By: Natasha Mead, M.D.   Dg Shoulder Right  07/30/2011  *RADIOLOGY REPORT*  Clinical Data: Fall, shoulder pain  RIGHT SHOULDER - 2+ VIEW  Comparison: None.  Findings: Two views of the right shoulder submitted.  There is mild displaced  fracture with cortical step-off right humeral neck.  Mild degenerative changes right AC joint. Dystrophic calcifications are noted above distal right clavicle.  IMPRESSION: Mild displaced fracture right humeral neck.  Original Report Authenticated By: Natasha Mead, M.D.   Dg Elbow Complete Right  07/30/2011  *RADIOLOGY REPORT*  Clinical Data: Fall, shoulder pain  RIGHT ELBOW - COMPLETE 3+ VIEW  Comparison: None.  Findings: Four views of the right elbow submitted.  No acute fracture or subluxation.  No posterior fat pad sign.  IMPRESSION: No acute fracture or subluxation.  Original Report Authenticated By: Natasha Mead, M.D.   Ct Head Wo Contrast  07/30/2011  *RADIOLOGY REPORT*  Clinical Data:  78 year old male with fall with head and neck injury.  CT HEAD WITHOUT CONTRAST CT CERVICAL SPINE WITHOUT CONTRAST  Technique:  Multidetector CT imaging of the head and cervical spine was performed following the standard protocol without intravenous contrast.  Multiplanar CT image reconstructions of the cervical spine were also generated.  Comparison:  02/13/2011 cervical spine MRI.  CT HEAD  Findings: Mild to moderate atrophy and chronic small vessel white matter ischemic changes are noted.  No acute intracranial abnormalities are identified, including mass lesion or mass effect, hydrocephalus, extra-axial fluid collection, midline shift, hemorrhage, or acute infarction.  The visualized bony calvarium is unremarkable.  IMPRESSION: No evidence of acute intracranial abnormality.  Atrophy and chronic small vessel white matter ischemic changes.  CT CERVICAL SPINE  Findings: Anterior plate/screw fixation and interbody fusion material at C3-C4-C5-C6-C7 is noted. There is no evidence of acute fracture, subluxation or prevertebral soft tissue swelling. No complicating hardware features are identified. Mild facet arthropathy throughout the cervical spine identified. This contributes to bony foraminal narrowing at several levels. No  focal bony lesions are present.  IMPRESSION: No static evidence of acute injury to the cervical spine.  Anterior/interbody fusion from C3-C7 without definite complicating features.  Multilevel facet arthropathy contributing to bony foraminal narrowing at several levels.  Original Report Authenticated By: Rosendo Gros, M.D.   Ct Cervical Spine Wo Contrast  07/30/2011  *RADIOLOGY REPORT*  Clinical Data:  77 year old male with fall with head and neck injury.  CT HEAD WITHOUT CONTRAST CT CERVICAL SPINE WITHOUT CONTRAST  Technique:  Multidetector CT imaging of the head and cervical spine was performed following the standard protocol without intravenous contrast.  Multiplanar CT image reconstructions of the cervical spine were also generated.  Comparison:  02/13/2011 cervical spine MRI.  CT HEAD  Findings: Mild to moderate atrophy and chronic small vessel white matter ischemic changes are noted.  No acute intracranial abnormalities are identified, including mass lesion or mass effect, hydrocephalus, extra-axial fluid collection, midline shift, hemorrhage, or acute infarction.  The visualized bony calvarium is unremarkable.  IMPRESSION: No evidence of acute intracranial abnormality.  Atrophy and chronic small vessel white matter ischemic changes.  CT CERVICAL SPINE  Findings: Anterior plate/screw fixation and interbody fusion material at C3-C4-C5-C6-C7 is noted.  There is no evidence of acute fracture, subluxation or prevertebral soft tissue swelling. No complicating hardware features are identified. Mild facet arthropathy throughout the cervical spine identified. This contributes to bony foraminal narrowing at several levels. No focal bony lesions are present.  IMPRESSION: No static evidence of acute injury to the cervical spine.  Anterior/interbody fusion from C3-C7 without definite complicating features.  Multilevel facet arthropathy contributing to bony foraminal narrowing at several levels.  Original Report  Authenticated By: Rosendo Gros, M.D.   Dg Humerus Right  07/30/2011  *RADIOLOGY REPORT*  Clinical Data: Fall, shoulder pain  RIGHT HUMERUS - 2+ VIEW  Comparison: Right shoulder same day  Findings: Two views of the right humerus submitted.  There is mild displaced fracture proximal right humeral metaphysis.  IMPRESSION: Mild displaced fracture of proximal right humerus.  Original Report Authenticated By: Natasha Mead, M.D.    Assessment & Plan  Principal Problem:  *Fall Active Problems:  Gait disturbance  Humerus fracture  GERD (gastroesophageal reflux disease)  BPH (benign prostatic hyperplasia)  Leg edema    1. Fall. This is secondary to unsteady gait, patient recently discharged  from subacute rehabilitation, using a walker, will consult physical therapy and social worker, may need to be discharged to SNF as he was be able to use his walker with his fractured humers.  2. Humerus fracture. Consultants orthopedic he will evaluate the patient today meanwhile continue with a sling.  3. GERD. continue with proton  4. BPH. Continue with Flomax  5. Leg edema. This is chronic continue with Lasix   DVT Prophylaxis Heparin  AM Labs Ordered, also please review Full Orders  Family Communication: Admission, patients condition and plan of care including tests being ordered have been discussed with the patient  who indicate understanding and agree with the plan and Code Status.  Code Status full  Disposition Plan: SNF  Time spent in minutes :  Condition Vanessa St. John, Loyed Wilmes M.D on 07/30/2011 at 11:23 AM  [facility] Office  [phone]

## 2011-07-30 NOTE — ED Notes (Signed)
Per EMS, pt fell while in bathroom.  Pt with recent C4 - C7 surgery with plate.  Pt also states several back surgeries.  Pt with hx of low platelets.  Pt c/o right shoulder pain.  Pt with small laceration to back left side of head.  75 mcg Fentanyl given by EMS.

## 2011-07-31 DIAGNOSIS — W19XXXA Unspecified fall, initial encounter: Secondary | ICD-10-CM

## 2011-07-31 DIAGNOSIS — S42309A Unspecified fracture of shaft of humerus, unspecified arm, initial encounter for closed fracture: Secondary | ICD-10-CM

## 2011-07-31 DIAGNOSIS — K219 Gastro-esophageal reflux disease without esophagitis: Secondary | ICD-10-CM

## 2011-07-31 DIAGNOSIS — N400 Enlarged prostate without lower urinary tract symptoms: Secondary | ICD-10-CM

## 2011-07-31 DIAGNOSIS — R269 Unspecified abnormalities of gait and mobility: Secondary | ICD-10-CM

## 2011-07-31 DIAGNOSIS — R1310 Dysphagia, unspecified: Secondary | ICD-10-CM

## 2011-07-31 LAB — BASIC METABOLIC PANEL
Chloride: 109 mEq/L (ref 96–112)
Creatinine, Ser: 0.98 mg/dL (ref 0.50–1.35)
GFR calc Af Amer: 87 mL/min — ABNORMAL LOW (ref >90)
GFR calc non Af Amer: 75 mL/min — ABNORMAL LOW (ref >90)
Potassium: 3.7 mEq/L (ref 3.5–5.1)

## 2011-07-31 LAB — CBC
Platelets: 85 10*3/uL — ABNORMAL LOW (ref 150–400)
RDW: 13.2 % (ref 11.5–15.5)
WBC: 3.3 10*3/uL — ABNORMAL LOW (ref 4.0–10.5)

## 2011-07-31 LAB — URINE CULTURE

## 2011-07-31 MED ORDER — OXYCODONE-ACETAMINOPHEN 5-325 MG PO TABS
1.0000 | ORAL_TABLET | ORAL | Status: DC | PRN
  Administered 2011-07-31 – 2011-08-01 (×4): 2 via ORAL
  Filled 2011-07-31 (×4): qty 2

## 2011-07-31 MED ORDER — MORPHINE SULFATE 2 MG/ML IJ SOLN
2.0000 mg | Freq: Every day | INTRAMUSCULAR | Status: DC | PRN
  Administered 2011-08-01: 2 mg via INTRAVENOUS
  Filled 2011-07-31: qty 1

## 2011-07-31 NOTE — Clinical Social Work Psychosocial (Signed)
     Clinical Social Work Department BRIEF PSYCHOSOCIAL ASSESSMENT 07/31/2011  Patient:  Brandon Hardy, Brandon Hardy     Account Number:  [id number]     Admit date:  07/30/2011  Clinical Social Worker:  Burnard Hawthorne  Date/Time:  07/31/2011 04:44 PM  Referred by:  Physician  Date Referred:  07/31/2011 Referred for  SNF Placement   Other Referral:   Interview type:  Patient Other interview type:    PSYCHOSOCIAL DATA Living Status:  WIFE Admitted from facility:   Level of care:   Primary support name:  Woodie Degraffenreid   [phone] Primary support relationship to patient:  SPOUSE Degree of support available:   Good support    CURRENT CONCERNS Current Concerns  Post-Acute Placement   Other Concerns:   Patient resided at [facility] until he returned home 3 weeks ago.    SOCIAL WORK ASSESSMENT / PLAN CSW referred to assist this 78 year old male with short term SNF. He was recently d/c'd from [facility] (approx. 3 weeks ago) and returned home with his wife. Patient has had multple falls at home since d/c from SNF.  Physical Therapy is now recommended SNF placement again. Patient wants to return to [facility]; spoke with Lowella Bandy- Admissions- she stated that patient has 18 insurance days left in the SNF; if he needs SNF beyond that- will either have to be self pay, apply for Medicaid or return home.  They will offer a bed for patient tomorrow.  FL2 on chart for MD's signature.   Assessment/plan status:  Psychosocial Support/Ongoing Assessment of Needs Other assessment/ plan:   Information/referral to community resources:   SNF list given to patient; discussed after care options including  home health, DME and private duty services.    PATIENTS/FAMILYS RESPONSE TO PLAN OF CARE: Patient is alert and oriented; he is agreeable to SNF placement and wanted placement at [facility].

## 2011-07-31 NOTE — Evaluation (Addendum)
Physical Therapy Evaluation Patient Details Name: Brandon Hardy MRN: [id number] DOB: 12-Feb-1927 Today's Date: 07/31/2011 Time: 4098-1191 PT Time Calculation (min): 26 min  PT Assessment / Plan / Recommendation Clinical Impression  Pt is 78 yo male with right humeral fx s/p fall.  Patient unable to ambulate safely without assistance and is not a candidate for use of rolling walker or platform walker secondary to RUE fx and increased pain.  Patient presents with deficits in balance and functional mobility.  Pt will benefit from continued skilled physical therapy to increase mobility and safety.  Recommend discharge to SNF for continued rehab.    PT Assessment  Patient needs continued PT services    Follow Up Recommendations  Skilled nursing facility    Barriers to Discharge Decreased caregiver support Patient will require 24 hr assistance upon discharge    Equipment Recommendations       Recommendations for Other Services     Frequency Min 5X/week    Precautions / Restrictions Precautions Required Braces or Orthoses: Other Brace/Splint Other Brace/Splint: Right Shoulder Sling   Pertinent Vitals/Pain 2/10 pre activity to 9/10 during activity      Mobility  Transfers Transfers: Sit to Stand;Stand to Sit Sit to Stand: 3: Mod assist;From chair/3-in-1;With upper extremity assist Stand to Sit: 3: Mod assist;With upper extremity assist;To chair/3-in-1 Ambulation/Gait Ambulation/Gait Assistance: 4: Min assist Ambulation Distance (Feet): 80 Feet Assistive device: 1 person hand held assist;Other (Comment) (IV Pole) Ambulation/Gait Assistance Details: Pt required min assist around trunk and UE support with IV pole Gait Pattern: Step-to pattern;Decreased stride length;Narrow base of support Gait velocity: decreased General Gait Details: Pt is unsteady with narrow base of support and multiple balance checks.  Patient requires assistance for ambulation and VCs for direction and upright  posture.    Exercises     PT Diagnosis: Difficulty walking;Abnormality of gait;Generalized weakness;Acute pain  PT Problem List: Decreased strength;Decreased activity tolerance;Decreased balance;Decreased mobility;Decreased coordination;Decreased knowledge of use of DME;Decreased safety awareness;Pain PT Treatment Interventions: Gait training;Therapeutic activities;Therapeutic exercise   PT Goals Acute Rehab PT Goals PT Goal Formulation: With patient Time For Goal Achievement: 08/07/11 Potential to Achieve Goals: Fair Pt will go Supine/Side to Sit: with min assist PT Goal: Supine/Side to Sit - Progress: Goal set today Pt will Sit at Edge of Bed: with modified independence PT Goal: Sit at Edge Of Bed - Progress: Goal set today Pt will go Sit to Stand: with min assist PT Goal: Sit to Stand - Progress: Goal set today Pt will go Stand to Sit: with min assist PT Goal: Stand to Sit - Progress: Goal set today Pt will Ambulate: >150 feet;with min assist PT Goal: Ambulate - Progress: Goal set today  Visit Information  Last PT Received On: 07/31/11 Assistance Needed: +1    Subjective Data  Subjective: I cant do anything with my right arm at all Patient Stated Goal: To go home   Prior Functioning  Home Living Lives With: Spouse Prior Function Level of Independence: Independent with assistive device(s) (Patient recent history at SNF) Needs Assistance: Gait    Cognition  Overall Cognitive Status: Appears within functional limits for tasks assessed/performed Arousal/Alertness: Awake/alert Orientation Level: Oriented X4 / Intact;Appears intact for tasks assessed Behavior During Session: [facility] for tasks performed    Extremity/Trunk Assessment Right Lower Extremity Assessment RLE ROM/Strength/Tone: [facility] for tasks assessed RLE Sensation: WFL - Proprioception;WFL - Light Touch RLE Coordination: WFL - gross motor Left Lower Extremity Assessment LLE ROM/Strength/Tone: WFL for tasks  assessed LLE  Sensation: WFL - Light Touch;WFL - Proprioception LLE Coordination: WFL - gross motor   Balance Balance Balance Assessed: Yes Dynamic Standing Balance Dynamic Standing - Balance Support: Left upper extremity supported  End of Session PT - End of Session Equipment Utilized During Treatment: Gait belt Activity Tolerance: Patient tolerated treatment well;Patient limited by pain Patient left: in bed;with call bell/phone within reach Nurse Communication: Mobility status  GP   Functional Limitation: Mobility: Walking and moving around Mobility: Walking and Moving Around Current Status ([id number]): At least 20 percent but less than 40 percent impaired, limited or restricted Mobility: Walking and Moving Around Goal Status [phone]): At least 1 percent but less than 20 percent impaired, limited or restricted Mobility: Walking and Moving Around Discharge Status [phone]): At least 20 percent but less than 40 percent impaired, limited or restricted   Fabio Asa 07/31/2011, 1:13 PM  Charlotte Crumb, PT DPT  [phone]

## 2011-07-31 NOTE — Progress Notes (Signed)
CARE MANAGEMENT NOTE 07/31/2011  Patient:  Brandon Hardy, Brandon Hardy   Account Number:  [id number]  Date Initiated:  07/31/2011  Documentation initiated by:  Vance Peper  Subjective/Objective Assessment:   78 yr old male s/p fall with right humeral fracture     Action/Plan:   Patient unable to use walker, will require SNF for shortterm rehab. Social worker, Lovette Cliche aware.   Anticipated DC Date:  08/02/2011   Anticipated DC Plan:  SKILLED NURSING FACILITY  In-house referral  Clinical Social Worker      DC Planning Services  CM consult      Choice offered to / List presented to:             Status of service:  Completed, signed off Medicare Important Message given?   (If response is "NO", the following Medicare IM given date fields will be blank) Date Medicare IM given:   Date Additional Medicare IM given:    Discharge Disposition:  SKILLED NURSING FACILITY  Per UR Regulation:    If discussed at Long Length of Stay Meetings, dates discussed:    Comments:

## 2011-07-31 NOTE — Clinical Social Work Placement (Addendum)
    Clinical Social Work Department CLINICAL SOCIAL WORK PLACEMENT NOTE 07/31/2011  Patient:  Brandon Hardy, Brandon Hardy  Account Number:  [id number] Admit date:  07/30/2011  Clinical Social Worker:  Lupita Leash Mollie Rossano, BSW  Date/time:  07/31/2011 04:51 PM  Clinical Social Work is seeking post-discharge placement for this patient at the following level of care:   SKILLED NURSING   (*CSW will update this form in Epic as items are completed)   07/31/2011  Patient/family provided with [facility] Department of Clinical Social Work's list of facilities offering this level of care within the geographic area requested by the patient (or if unable, by the patient's family).  07/31/2011  Patient/family informed of their freedom to choose among providers that offer the needed level of care, that participate in Medicare, Medicaid or managed care program needed by the patient, have an available bed and are willing to accept the patient.  07/31/2011  Patient/family informed of [facility]' ownership interest in [facility], as well as of the fact that they are under no obligation to receive care at this facility.  PASARR submitted to EDS on  PASARR number received from EDS on   FL2 transmitted to all facilities in geographic area requested by pt/family on  07/31/2011 FL2 transmitted to all facilities within larger geographic area on   Patient informed that his/her managed care company has contracts with or will negotiate with  certain facilities, including the following:   Patient has [facility]- Medicare Complete  Patient has existing PASARR from past hospitalization     Patient/family informed of bed offers received:  07/31/2011 Patient chooses bed at [facility] Physician recommends and patient chooses bed at    Patient to be transferred to [facility] on  08/01/11 Patient to be transferred to facility by ambulance  The following physician request were  entered in Epic:   Additional Comments: Patient is pleased with d/c plan. He has notified his wife of d/c.  Notified SNF and patient's nurse of d/c plan.  No further CSW intervention indicated.   Lorri Frederick. [place]  [phone]

## 2011-07-31 NOTE — Evaluation (Signed)
Occupational Therapy Evaluation Patient Details Name: Brandon Hardy MRN: [id number] DOB: [birth year] Today's Date: 07/31/2011 Time: 1020-1046 OT Time Calculation (min): 26 min  OT Assessment / Plan / Recommendation Clinical Impression  Pt s/p left displaced humerus fracture secondary to fall at home. Pt presents with significant pain with activity, decreased I with ADL and mobility. Pt will benefit from skilled OT in the acute setting to maximize I with ADL, LUE management and ADL mobility prior to d/c    OT Assessment  Patient needs continued OT Services    Follow Up Recommendations  Skilled nursing facility    Barriers to Discharge Decreased caregiver support wife is available 24/7 but is unable to provide necessary level of assist for pt  Equipment Recommendations  Defer to next venue    Recommendations for Other Services    Frequency  Min 2X/week    Precautions / Restrictions Precautions Precautions: Shoulder Precaution Comments: Pt physician did not explicitly state, NWB but assuming this is true as pt with displaced humerus fracture Required Braces or Orthoses: Other Brace/Splint Other Brace/Splint: Right Shoulder Sling   Pertinent Vitals/Pain Pt initially 3-4/10 LUE pain, but stated this increased with activity. However, he refused to give inc pain a number rating. RN informed and provided pain medication via IV    ADL  Grooming: Performed;Moderate assistance Where Assessed - Grooming: Supported sitting Upper Body Bathing: Simulated;Moderate assistance Where Assessed - Upper Body Bathing: Supported sitting Lower Body Bathing: Simulated;Moderate assistance Where Assessed - Lower Body Bathing: Supported sit to stand Lower Body Dressing: Simulated;Maximal assistance Where Assessed - Lower Body Dressing: Supported sit to stand Toilet Transfer: Simulated;Moderate assistance Toilet Transfer Method: Sit to Barista:  (from bed) Toileting - Clothing  Manipulation and Hygiene: Simulated;Moderate assistance Where Assessed - Toileting Clothing Manipulation and Hygiene: Standing Equipment Used: Gait belt Transfers/Ambulation Related to ADLs: Pt Mod A with Stand-pivot with 3-4 pivotal steps to chair ADL Comments: pt unable to tolerate elbow extension secondary to pain- RN informed and provided pain medication    OT Diagnosis: Generalized weakness;Acute pain  OT Problem List: Decreased strength;Decreased activity tolerance;Decreased range of motion;Impaired balance (sitting and/or standing);Decreased knowledge of use of DME or AE;Decreased knowledge of precautions;Impaired sensation;Impaired UE functional use;Pain OT Treatment Interventions: Self-care/ADL training;DME and/or AE instruction;Therapeutic activities;Patient/family education;Balance training   OT Goals Acute Rehab OT Goals OT Goal Formulation: With patient Time For Goal Achievement: 08/14/11 Potential to Achieve Goals: Good ADL Goals Pt Will Perform Eating: with modified independence;Supported;Sitting, chair ADL Goal: Eating - Progress: Goal set today Pt Will Perform Grooming: with modified independence;Standing at sink;Sitting, chair;Supported ADL Goal: Grooming - Progress: Goal set today Pt Will Perform Upper Body Bathing: with set-up;with supervision;Sitting, edge of bed ADL Goal: Upper Body Bathing - Progress: Goal set today Pt Will Perform Upper Body Dressing: with set-up;with supervision;Sitting, chair;Sitting, bed ADL Goal: Upper Body Dressing - Progress: Goal set today Pt Will Transfer to Toilet: with min assist;Ambulation;with DME ADL Goal: Toilet Transfer - Progress: Goal set today Pt Will Perform Toileting - Clothing Manipulation: with min assist;Standing ADL Goal: Toileting - Clothing Manipulation - Progress: Goal set today Additional ADL Goal #1: Pt will I'ly instruct caregiver in LUE sling management ADL Goal: Additional Goal #1 - Progress: Goal set today  Visit  Information  Last OT Received On: 07/31/11 Assistance Needed: +1    Subjective Data  Subjective: I want a real [employer] Patient Stated Goal: Return home with wife   Prior Functioning  Vision/Perception  Home Living Lives With: Spouse Available Help at Discharge: Available 24 hours/day (but can only provide very minimal assist) Type of Home: House Home Access: Stairs to enter [employer] of Steps: 3 Entrance Stairs-Rails: Right Home Layout: One level Bathroom Shower/Tub: Tub/shower unit;Curtain Bathroom Toilet: Standard Home Adaptive Equipment: Bedside commode/3-in-1;Grab bars in shower;Shower chair with back;Straight cane Additional Comments: pt states PTA he and wife were basically taking care of each other Prior Function Level of Independence: Independent with assistive device(s) Needs Assistance: Gait Vocation: Retired Musician: 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 Dominant Hand: Right   Vision - Assessment Eye Alignment: Within Functional Limits  Cognition  Overall Cognitive Status: Appears within functional limits for tasks assessed/performed Arousal/Alertness: Awake/alert Orientation Level: Oriented X4 / Intact;Appears intact for tasks assessed Behavior During Session: [facility] for tasks performed    Extremity/Trunk Assessment Right Upper Extremity Assessment RUE ROM/Strength/Tone: Unable to fully assess;Due to pain RUE Sensation:  (pre-morbid deficits; pt denies any changes) RUE Coordination:  (NT) Left Upper Extremity Assessment LUE ROM/Strength/Tone: Deficits LUE ROM/Strength/Tone Deficits: Pt reports pre-morbid weakness from previous surgery. Grossly 4/5 throughout with 4-/5 grip LUE Sensation: Deficits (premorbid with no changes per pt) LUE Coordination: WFL - gross/fine motor Right Lower Extremity Assessment RLE ROM/Strength/Tone: WFL for tasks assessed RLE Sensation: WFL - Proprioception;WFL - Light Touch RLE Coordination: WFL - gross motor Left  Lower Extremity Assessment LLE ROM/Strength/Tone: WFL for tasks assessed LLE Sensation: WFL - Light Touch;WFL - Proprioception LLE Coordination: WFL - gross motor   Mobility Bed Mobility Bed Mobility: Supine to Sit;Sitting - Scoot to Edge of Bed Supine to Sit: 2: Max assist;HOB elevated;With rails Sitting - Scoot to Edge of Bed: 3: Mod assist Details for Bed Mobility Assistance: Pt limited by fear of pain Transfers Transfers: Sit to Stand;Stand to Sit Sit to Stand: 3: Mod assist;From bed;With upper extremity assist Stand to Sit: 4: Min assist;With armrests;To chair/3-in-1 Details for Transfer Assistance: pt with difficulty with balance upon standing- leans to right   Exercise    Balance Balance Balance Assessed: Yes Static Sitting Balance Static Sitting - Balance Support: Left upper extremity supported;Feet supported Static Sitting - Level of Assistance: 3: Mod assist (transitioning to Min A sitting EOB) Static Sitting - Comment/# of Minutes: ~37min sitting EOB Dynamic Standing Balance Dynamic Standing - Balance Support: Left upper extremity supported  End of Session OT - End of Session Equipment Utilized During Treatment: Gait belt Activity Tolerance: Patient limited by pain Patient left: in chair;with call bell/phone within reach;with family/visitor present Nurse Communication: Mobility status  GO Functional Assessment Tool Used: Clinical judgement Functional Limitation: Self care Self Care Current Status (N8295): At least 40 percent but less than 60 percent impaired, limited or restricted Self Care Goal Status (A2130): At least 1 percent but less than 20 percent impaired, limited or restricted   Rexton Greulich 07/31/2011, 2:16 PM

## 2011-07-31 NOTE — Progress Notes (Signed)
[facility]                                                                                Patient Demographics  Brandon Hardy, is a 78 y.o. male  ZOX:[id number]  [id number]  DOB - 1927/11/01  Admit date - 07/30/2011  Admitting Physician Huey Bienenstock, MD  Outpatient Primary MD for the patient is Gaye Alken, MD  LOS - 1   Chief Complaint  Patient presents with  . Fall  . Shoulder Pain        Assessment & Plan    Principal Problem:  *Fall Active Problems:  Gait disturbance  Humerus fracture  GERD (gastroesophageal reflux disease)  BPH (benign prostatic hyperplasia)  Leg edema  1. Fall and unsteady gait: This is due to mechanical fall , no near-syncope or syncope or dizziness or light headedness .PT was consulted, as well social worker was consulted as patient is to be discharged to skilled nursing facility, to continue with acute physical therapy, especially patient won't be able to use his walker.  2. Humerus fracture. Orthopedic consult appreciated continue with sling for comfort. PT/OT.  3. GERD. continue with protonix  4. BPH. Continue with Flomax   5. Leg edema. This is chronic continue with Lasix   Code Status: Full  Family Communication: patient  Disposition Plan: SNF    Procedures   none     Consults   Orthopedic Dr Magnus Ivan   Antibiotics   none   Time Spent in minutes   25   DVT Prophylaxis   Heparin   Bryttani Blew M.D on 07/31/2011 at 12:41 PM  Between 7am to 7pm - Pager - [phone]  After 7pm go to [email or web address] - password [email or web address]  And look for the night coverage person covering for me after hours  [facility] Office  [phone]    Subjective:   [name] today has, No headache, No chest pain, No abdominal pain - No Nausea, No new weakness tingling or numbness, No Cough - SOB.   Objective:   Filed Vitals:   07/30/11 1159 07/30/11 1428 07/30/11 2207  07/31/11 0613  BP: 129/62 128/44 112/46 117/46  Pulse: 49 56 59 56  Temp: 98 F (36.7 C) 98.6 F (37 C) 99.5 F (37.5 C) 98.5 F (36.9 C)  TempSrc:  Oral    Resp: 16 18 18 18   SpO2: 100% 100% 96% 97%    Wt Readings from Last 3 Encounters:  No data found for Wt     Intake/Output Summary (Last 24 hours) at 07/31/11 1241 Last data filed at 07/31/11 0700  Gross per 24 hour  Intake    860 ml  Output    800 ml  Net     60 ml    Exam Awake Alert, Oriented X 3, No new F.N deficits, Normal affect [place].AT,PERRAL Supple Neck,No JVD, No cervical lymphadenopathy appriciated.  Symmetrical Chest wall movement, Good air movement bilaterally, CTAB RRR,No Gallops,Rubs or new Murmurs, No Parasternal Heave +ve B.Sounds, Abd Soft, Non tender, No organomegaly appriciated, No rebound - guarding or rigidity. 10. Good muscle tone, joints appear normal , no effusions, bilateral lower extremity edema +1 to +  2, didn't decreased range of motion in right arm secondary pain from Frx. has a sling.  No Cyanosis, Clubbing or edema, No new Rash or bruise     Data Review   CBC  Lab 07/31/11 0610 07/30/11 0804  WBC 3.3* 3.7*  HGB 9.5* 10.5*  HCT 28.5* 31.5*  PLT 85* 90*  MCV 96.9 96.9  MCH 32.3 32.3  MCHC 33.3 33.3  RDW 13.2 12.9  LYMPHSABS -- 0.8  MONOABS -- 0.6  EOSABS -- 0.2  BASOSABS -- 0.0  BANDABS -- --    Chemistries   Lab 07/31/11 0610 07/30/11 0804  NA 142 144  K 3.7 3.6  CL 109 107  CO2 28 30  GLUCOSE 91 106*  BUN 12 10  CREATININE 0.98 1.05  CALCIUM 8.5 8.9  MG -- --  AST -- --  ALT -- --  ALKPHOS -- --  BILITOT -- --   ------------------------------------------------------------------------------------------------------------------ CrCl is unknown because there is no height on file for the current visit. ------------------------------------------------------------------------------------------------------------------ No results found for this basename: HGBA1C:2 in  the last 72 hours ------------------------------------------------------------------------------------------------------------------ No results found for this basename: CHOL:2,HDL:2,LDLCALC:2,TRIG:2,CHOLHDL:2,LDLDIRECT:2 in the last 72 hours ------------------------------------------------------------------------------------------------------------------ No results found for this basename: TSH,T4TOTAL,FREET3,T3FREE,THYROIDAB in the last 72 hours ------------------------------------------------------------------------------------------------------------------ No results found for this basename: VITAMINB12:2,FOLATE:2,FERRITIN:2,TIBC:2,IRON:2,RETICCTPCT:2 in the last 72 hours  Coagulation profile No results found for this basename: INR:5,PROTIME:5 in the last 168 hours  No results found for this basename: DDIMER:2 in the last 72 hours  Cardiac Enzymes No results found for this basename: CK:3,CKMB:3,TROPONINI:3,MYOGLOBIN:3 in the last 168 hours ------------------------------------------------------------------------------------------------------------------ No components found with this basename: POCBNP:3  Micro Results No results found for this or any previous visit (from the past 240 hour(s)).  Radiology Reports Dg Chest 2 View  07/30/2011  *RADIOLOGY REPORT*  Clinical Data: Fall, shoulder pain  CHEST - 2 VIEW  Comparison: 11/09/2010  Findings: Borderline cardiomegaly again noted.  No acute infiltrate or pleural effusion.  No pulmonary edema.  Visualized bony structures are unremarkable.  IMPRESSION: No active disease.  No significant change.  Original Report Authenticated By: Natasha Mead, M.D.   Dg Pelvis 1-2 Views  07/30/2011  *RADIOLOGY REPORT*  Clinical Data: Shoulder pain, fall  PELVIS - 1-2 VIEW  Comparison: 01/07/2009  Findings: Single frontal view of the pelvis submitted.  No acute fracture or subluxation.  There is diffuse osteopenia.  Stable postsurgical changes of the lumbosacral  spine.  IMPRESSION: Diffuse osteopenia.  No acute fracture or subluxation.  Original Report Authenticated By: Natasha Mead, M.D.   Dg Shoulder Right  07/30/2011  *RADIOLOGY REPORT*  Clinical Data: Fall, shoulder pain  RIGHT SHOULDER - 2+ VIEW  Comparison: None.  Findings: Two views of the right shoulder submitted.  There is mild displaced fracture with cortical step-off right humeral neck.  Mild degenerative changes right AC joint. Dystrophic calcifications are noted above distal right clavicle.  IMPRESSION: Mild displaced fracture right humeral neck.  Original Report Authenticated By: Natasha Mead, M.D.   Dg Elbow Complete Right  07/30/2011  *RADIOLOGY REPORT*  Clinical Data: Fall, shoulder pain  RIGHT ELBOW - COMPLETE 3+ VIEW  Comparison: None.  Findings: Four views of the right elbow submitted.  No acute fracture or subluxation.  No posterior fat pad sign.  IMPRESSION: No acute fracture or subluxation.  Original Report Authenticated By: Natasha Mead, M.D.   Ct Head Wo Contrast  07/30/2011  *RADIOLOGY REPORT*  Clinical Data:  78 year old male with fall with head and neck injury.  CT HEAD  WITHOUT CONTRAST CT CERVICAL SPINE WITHOUT CONTRAST  Technique:  Multidetector CT imaging of the head and cervical spine was performed following the standard protocol without intravenous contrast.  Multiplanar CT image reconstructions of the cervical spine were also generated.  Comparison:  02/13/2011 cervical spine MRI.  CT HEAD  Findings: Mild to moderate atrophy and chronic small vessel white matter ischemic changes are noted.  No acute intracranial abnormalities are identified, including mass lesion or mass effect, hydrocephalus, extra-axial fluid collection, midline shift, hemorrhage, or acute infarction.  The visualized bony calvarium is unremarkable.  IMPRESSION: No evidence of acute intracranial abnormality.  Atrophy and chronic small vessel white matter ischemic changes.  CT CERVICAL SPINE  Findings: Anterior  plate/screw fixation and interbody fusion material at C3-C4-C5-C6-C7 is noted. There is no evidence of acute fracture, subluxation or prevertebral soft tissue swelling. No complicating hardware features are identified. Mild facet arthropathy throughout the cervical spine identified. This contributes to bony foraminal narrowing at several levels. No focal bony lesions are present.  IMPRESSION: No static evidence of acute injury to the cervical spine.  Anterior/interbody fusion from C3-C7 without definite complicating features.  Multilevel facet arthropathy contributing to bony foraminal narrowing at several levels.  Original Report Authenticated By: Rosendo Gros, M.D.   Ct Cervical Spine Wo Contrast  07/30/2011  *RADIOLOGY REPORT*  Clinical Data:  78 year old male with fall with head and neck injury.  CT HEAD WITHOUT CONTRAST CT CERVICAL SPINE WITHOUT CONTRAST  Technique:  Multidetector CT imaging of the head and cervical spine was performed following the standard protocol without intravenous contrast.  Multiplanar CT image reconstructions of the cervical spine were also generated.  Comparison:  02/13/2011 cervical spine MRI.  CT HEAD  Findings: Mild to moderate atrophy and chronic small vessel white matter ischemic changes are noted.  No acute intracranial abnormalities are identified, including mass lesion or mass effect, hydrocephalus, extra-axial fluid collection, midline shift, hemorrhage, or acute infarction.  The visualized bony calvarium is unremarkable.  IMPRESSION: No evidence of acute intracranial abnormality.  Atrophy and chronic small vessel white matter ischemic changes.  CT CERVICAL SPINE  Findings: Anterior plate/screw fixation and interbody fusion material at C3-C4-C5-C6-C7 is noted. There is no evidence of acute fracture, subluxation or prevertebral soft tissue swelling. No complicating hardware features are identified. Mild facet arthropathy throughout the cervical spine identified. This  contributes to bony foraminal narrowing at several levels. No focal bony lesions are present.  IMPRESSION: No static evidence of acute injury to the cervical spine.  Anterior/interbody fusion from C3-C7 without definite complicating features.  Multilevel facet arthropathy contributing to bony foraminal narrowing at several levels.  Original Report Authenticated By: Rosendo Gros, M.D.   Dg Humerus Right  07/30/2011  *RADIOLOGY REPORT*  Clinical Data: Fall, shoulder pain  RIGHT HUMERUS - 2+ VIEW  Comparison: Right shoulder same day  Findings: Two views of the right humerus submitted.  There is mild displaced fracture proximal right humeral metaphysis.  IMPRESSION: Mild displaced fracture of proximal right humerus.  Original Report Authenticated By: Natasha Mead, M.D.    Scheduled Meds:   . sodium chloride   Intravenous STAT  . B-complex with vitamin C  1 tablet Oral Daily  . docusate sodium  100 mg Oral BID  . furosemide  40 mg Oral Daily  . gabapentin  100 mg Oral BID  . heparin  5,000 Units Subcutaneous Q8H  . pantoprazole  40 mg Oral Q1200  . senna  1 tablet Oral BID  .  Tamsulosin HCl  0.4 mg Oral Daily   Continuous Infusions:  PRN Meds:.alum & mag hydroxide-simeth, fentaNYL, magnesium hydroxide, morphine injection, morphine injection, ondansetron (ZOFRAN) IV, ondansetron, oxyCODONE-acetaminophen, DISCONTD: acetaminophen-codeine

## 2011-07-31 NOTE — Progress Notes (Signed)
INITIAL ADULT NUTRITION ASSESSMENT Date: 07/31/2011   Time: 1:06 PM  INTERVENTION:  Change diet to Dysphagia 3 (chopped meats)  Please obtain & record current weight  No further intervention at this time -- pt declined supplements  Reason for Assessment: Nutrition Risk Report  ASSESSMENT: Male 78 y.o.  Dx: Fall  Hx:  Past Medical History  Diagnosis Date  . Arthritis   . Blood transfusion   . Renal disorder     Related Meds:     . sodium chloride   Intravenous STAT  . B-complex with vitamin C  1 tablet Oral Daily  . docusate sodium  100 mg Oral BID  . furosemide  40 mg Oral Daily  . gabapentin  100 mg Oral BID  . heparin  5,000 Units Subcutaneous Q8H  . pantoprazole  40 mg Oral Q1200  . senna  1 tablet Oral BID  . Tamsulosin HCl  0.4 mg Oral Daily    Ht: 5\' 6"  (182.8 cm)  Wt: 145 lb (65.9 kg) -- per patient  Ideal Wt: 64.5 kg  % Ideal Wt: 102%  Usual Wt: unknown % Usual Wt: ---  BMI = 19.9 kg/m2  Food/Nutrition Related Hx: problems chewing or swallowing foods and/or liquids per admission nutrition screen  Labs:  CMP     Component Value Date/Time   NA 142 07/31/2011 0610   K 3.7 07/31/2011 0610   CL 109 07/31/2011 0610   CO2 28 07/31/2011 0610   GLUCOSE 91 07/31/2011 0610   BUN 12 07/31/2011 0610   CREATININE 0.98 07/31/2011 0610   CALCIUM 8.5 07/31/2011 0610   GFRNONAA 75* 07/31/2011 0610   GFRAA 87* 07/31/2011 0610     Intake/Output Summary (Last 24 hours) at 07/31/11 1307 Last data filed at 07/31/11 0700  Gross per 24 hour  Intake    860 ml  Output    800 ml  Net     60 ml    Diet Order: General  Supplements/Tube Feeding: N/A  IVF: N/A  Estimated Nutritional Needs:   Kcal: 1650-1850 Protein: 80-90 gm Fluid: 1.6-1.8 L  RD spoke with patient regarding nutrition hx; admitted after mechanical fall 7/28; states his appetite OK; reports he needs chopped meats; PO intake 50% per flowsheet records; states he has lost approximately 20 lb in the  past 3 months, however, it's been stable recently; RD suspects some level of malnutrition; was discharged from a rehab facility; declining addition of supplements at this time  NUTRITION DIAGNOSIS: -Inadequate oral intake (NI-2.1).  Status: Ongoing  RELATED TO: decreased appetite  AS EVIDENCE BY: PO intake 50%  MONITORING/EVALUATION(Goals): Goal: Oral intake with meals to meet >/= 90% of estimated nutrition needs Monitor: PO intake, weight, labs, I/O's  EDUCATION NEEDS: -No education needs identified at this time  Dietitian #: [phone]  DOCUMENTATION CODES Per approved criteria  -Not Applicable    Alger Memos 07/31/2011, 1:06 PM

## 2011-07-31 NOTE — Progress Notes (Signed)
Utilization review completed. Seth Higginbotham, RN, BSN. 

## 2011-08-01 MED ORDER — DSS 100 MG PO CAPS: 100.0000 mg | ORAL_CAPSULE | Freq: Two times a day (BID) | ORAL | Status: AC

## 2011-08-01 MED ORDER — SENNA 8.6 MG PO TABS: 1.0000 | ORAL_TABLET | Freq: Two times a day (BID) | ORAL | Status: DC

## 2011-08-01 MED ORDER — OXYCODONE-ACETAMINOPHEN 5-325 MG PO TABS: 1.0000 | ORAL_TABLET | ORAL | Status: AC | PRN

## 2011-08-01 MED ORDER — ALUM & MAG HYDROXIDE-SIMETH 200-200-20 MG/5ML PO SUSP: 30.0000 mL | Freq: Four times a day (QID) | ORAL | Status: AC | PRN

## 2011-08-01 NOTE — Progress Notes (Signed)
Patient Brandon Hardy, 78 year old white male is preparing to [facility] nursing facility as he recovers from the effects of a fall.  Patient thanked Orthoptist for providing pastoral prayers, presence, and conversation.  No follow up needed.

## 2011-08-01 NOTE — Discharge Summary (Signed)
[facility]                                                                                   Brandon Hardy, is a 78 y.o. male  DOB [birth year]  MRN [id number].  Admission date:  07/30/2011  Discharge Date:  08/01/2011  Primary MD  Brandon Alken, MD  Admitting Physician  Brandon Bienenstock, MD  Admission Diagnosis  Dysphagia [787.2] Humeral fracture [812.20] Fall [E888.9] Scalp abrasion [910.0]  Discharge Diagnosis     Principal Problem:  *Fall Active Problems:  Gait disturbance  Humerus fracture  GERD (gastroesophageal reflux disease)  BPH (benign prostatic hyperplasia)  Leg edema      Past Medical History  Diagnosis Date  . Arthritis   . Blood transfusion   . Renal disorder     Past Surgical History  Procedure Date  . Back surgery   . Fracture surgery      Recommendations for primary care physician for things to follow:      Discharge Diagnoses:   Principal Problem:  *Fall Active Problems:  Gait disturbance  Humerus fracture  GERD (gastroesophageal reflux disease)  BPH (benign prostatic hyperplasia)  Leg edema    Discharge Condition: Stable   Diet recommendation: See Discharge Instructions below   Consults  Orthopedic DR Brandon Hardy   History of present illness and  Hospital Course:  See H&P, Labs, Consult and Test reports for all details in brief, patient is 78 yo right-handed male sustained an accidental mechanical fall today and injured his right shoulder. He was recently discharged from a rehab facility and ambulates with a walker, at home patient,s baseline was ambulating with a walker, found to have right humerus fracture, orthopedic service were consulted, head no surgical intervention at this point, recommend continue with a sling, follow up with 2 weeks as an outpatient, patient will be discharged to skilled nursing facility, as he is unable to ambulate using his walker secondary to his right  humerus fracture, and to continue with PT and OT.    1. Fall and unsteady gait: This is due to mechanical fall , no near-syncope or syncope or dizziness or light headedness .PT was consulted,, to continue with acute physical therapy, especially patient won't be able to use his walker.  2. Humerus fracture. Orthopedic consult appreciated continue with sling for comfort. PT/OT.  3. GERD. continue with protonix  4. BPH. Continue with Flomax  5. Leg edema. This is chronic continue with Lasix    Today   Subjective:   Brandon Hardy today has no headache,no chest abdominal pain,no new weakness tingling or numbness, feels much better.  Objective:   Blood pressure 100/41, pulse 49, temperature 97.9 F (36.6 C), temperature source Oral, resp. rate 18, SpO2 99.00%.  No intake or output data in the 24 hours ending 08/01/11 1312   Exam  Awake Alert, Oriented X 3, No new F.N deficits, Normal affect  [place].AT,PERRAL  Supple Neck,No JVD, No cervical lymphadenopathy appriciated.  Symmetrical Chest wall movement, Good air movement bilaterally, CTAB  RRR,No Gallops,Rubs or new Murmurs, No Parasternal Heave  +ve B.Sounds, Abd Soft, Non tender, No organomegaly appriciated, No rebound -  guarding or rigidity.  10. Good muscle tone, joints appear normal , no effusions, bilateral lower extremity edema +1 to +2, didn't decreased range of motion in right arm secondary pain from Frx. has a sling.  No Cyanosis, Clubbing or edema, No new Rash or bruise     Data Review   Major procedures and Radiology Reports - PLEASE review detailed and final reports for all details in brief -      Dg Chest 2 View  07/30/2011  *RADIOLOGY REPORT*  Clinical Data: Fall, shoulder pain  CHEST - 2 VIEW  Comparison: 11/09/2010  Findings: Borderline cardiomegaly again noted.  No acute infiltrate or pleural effusion.  No pulmonary edema.  Visualized bony structures are unremarkable.  IMPRESSION: No active disease.  No significant  change.  Original Report Authenticated By: Natasha Mead, M.D.   Dg Pelvis 1-2 Views  07/30/2011  *RADIOLOGY REPORT*  Clinical Data: Shoulder pain, fall  PELVIS - 1-2 VIEW  Comparison: 01/07/2009  Findings: Single frontal view of the pelvis submitted.  No acute fracture or subluxation.  There is diffuse osteopenia.  Stable postsurgical changes of the lumbosacral spine.  IMPRESSION: Diffuse osteopenia.  No acute fracture or subluxation.  Original Report Authenticated By: Natasha Mead, M.D.   Dg Shoulder Right  07/30/2011  *RADIOLOGY REPORT*  Clinical Data: Fall, shoulder pain  RIGHT SHOULDER - 2+ VIEW  Comparison: None.  Findings: Two views of the right shoulder submitted.  There is mild displaced fracture with cortical step-off right humeral neck.  Mild degenerative changes right AC joint. Dystrophic calcifications are noted above distal right clavicle.  IMPRESSION: Mild displaced fracture right humeral neck.  Original Report Authenticated By: Natasha Mead, M.D.   Dg Elbow Complete Right  07/30/2011  *RADIOLOGY REPORT*  Clinical Data: Fall, shoulder pain  RIGHT ELBOW - COMPLETE 3+ VIEW  Comparison: None.  Findings: Four views of the right elbow submitted.  No acute fracture or subluxation.  No posterior fat pad sign.  IMPRESSION: No acute fracture or subluxation.  Original Report Authenticated By: Natasha Mead, M.D.   Ct Head Wo Contrast  07/30/2011  *RADIOLOGY REPORT*  Clinical Data:  78 year old male with fall with head and neck injury.  CT HEAD WITHOUT CONTRAST CT CERVICAL SPINE WITHOUT CONTRAST  Technique:  Multidetector CT imaging of the head and cervical spine was performed following the standard protocol without intravenous contrast.  Multiplanar CT image reconstructions of the cervical spine were also generated.  Comparison:  02/13/2011 cervical spine MRI.  CT HEAD  Findings: Mild to moderate atrophy and chronic small vessel white matter ischemic changes are noted.  No acute intracranial abnormalities are  identified, including mass lesion or mass effect, hydrocephalus, extra-axial fluid collection, midline shift, hemorrhage, or acute infarction.  The visualized bony calvarium is unremarkable.  IMPRESSION: No evidence of acute intracranial abnormality.  Atrophy and chronic small vessel white matter ischemic changes.  CT CERVICAL SPINE  Findings: Anterior plate/screw fixation and interbody fusion material at C3-C4-C5-C6-C7 is noted. There is no evidence of acute fracture, subluxation or prevertebral soft tissue swelling. No complicating hardware features are identified. Mild facet arthropathy throughout the cervical spine identified. This contributes to bony foraminal narrowing at several levels. No focal bony lesions are present.  IMPRESSION: No static evidence of acute injury to the cervical spine.  Anterior/interbody fusion from C3-C7 without definite complicating features.  Multilevel facet arthropathy contributing to bony foraminal narrowing at several levels.  Original Report Authenticated By: Rosendo Gros, M.D.   Ct Cervical  Spine Wo Contrast  07/30/2011  *RADIOLOGY REPORT*  Clinical Data:  78 year old male with fall with head and neck injury.  CT HEAD WITHOUT CONTRAST CT CERVICAL SPINE WITHOUT CONTRAST  Technique:  Multidetector CT imaging of the head and cervical spine was performed following the standard protocol without intravenous contrast.  Multiplanar CT image reconstructions of the cervical spine were also generated.  Comparison:  02/13/2011 cervical spine MRI.  CT HEAD  Findings: Mild to moderate atrophy and chronic small vessel white matter ischemic changes are noted.  No acute intracranial abnormalities are identified, including mass lesion or mass effect, hydrocephalus, extra-axial fluid collection, midline shift, hemorrhage, or acute infarction.  The visualized bony calvarium is unremarkable.  IMPRESSION: No evidence of acute intracranial abnormality.  Atrophy and chronic small vessel white  matter ischemic changes.  CT CERVICAL SPINE  Findings: Anterior plate/screw fixation and interbody fusion material at C3-C4-C5-C6-C7 is noted. There is no evidence of acute fracture, subluxation or prevertebral soft tissue swelling. No complicating hardware features are identified. Mild facet arthropathy throughout the cervical spine identified. This contributes to bony foraminal narrowing at several levels. No focal bony lesions are present.  IMPRESSION: No static evidence of acute injury to the cervical spine.  Anterior/interbody fusion from C3-C7 without definite complicating features.  Multilevel facet arthropathy contributing to bony foraminal narrowing at several levels.  Original Report Authenticated By: Rosendo Gros, M.D.   Dg Humerus Right  07/30/2011  *RADIOLOGY REPORT*  Clinical Data: Fall, shoulder pain  RIGHT HUMERUS - 2+ VIEW  Comparison: Right shoulder same day  Findings: Two views of the right humerus submitted.  There is mild displaced fracture proximal right humeral metaphysis.  IMPRESSION: Mild displaced fracture of proximal right humerus.  Original Report Authenticated By: Natasha Mead, M.D.    Micro Results     Recent Results (from the past 240 hour(s))  URINE CULTURE     Status: Normal   Collection Time   07/30/11 10:26 AM      Component Value Range Status Comment   Specimen Description URINE, CLEAN CATCH   Final    Special Requests ADDED ON 07/30/11 AT 1142   Final    Culture  Setup Time 07/30/2011 18:09   Final    Colony Count 4,000 COLONIES/ML   Final    Culture INSIGNIFICANT GROWTH   Final    Report Status 07/31/2011 FINAL   Final      CBC w Diff:  Lab Results  Component Value Date   WBC 3.3* 07/31/2011   HGB 9.5* 07/31/2011   HCT 28.5* 07/31/2011   PLT 85* 07/31/2011   LYMPHOPCT 22 07/30/2011   MONOPCT 15* 07/30/2011   EOSPCT 4 07/30/2011   BASOPCT 1 07/30/2011    CMP:  Lab Results  Component Value Date   NA 142 07/31/2011   K 3.7 07/31/2011   CL 109 07/31/2011     CO2 28 07/31/2011   BUN 12 07/31/2011   CREATININE 0.98 07/31/2011  .   Discharge Instructions    Follow with Primary MD Brandon Alken, MD in 7 days   Get CBC, CMP, checked 7 days by Primary MD and again as instructed by your Primary MD. Get a 2 view Chest X ray done next visit if you had Pneumonia of Lung problems at the Hospital.  Get Medicines reviewed and adjusted.  Please request your Prim.MD to go over all Hospital Tests and Procedure/Radiological results at the follow up, please get all Hospital records sent to your  Prim MD by signing hospital release before you go home.  Activity: As tolerated with Full fall precautions use walker/cane & assistance as needed   Diet:  Heart healthy  For Heart failure patients - Check your Weight same time everyday, if you gain over 2 pounds, or you develop in leg swelling, experience more shortness of breath or chest pain, call your Primary MD immediately. Follow Cardiac Low Salt Diet and 1.8 lit/day fluid restriction.  Disposition SNF  If you experience worsening of your admission symptoms, develop shortness of breath, life threatening emergency, suicidal or homicidal thoughts you must seek medical attention immediately by calling 911 or calling your MD immediately  if symptoms less severe.  You Must read complete instructions/literature along with all the possible adverse reactions/side effects for all the Medicines you take and that have been prescribed to you. Take any new Medicines after you have completely understood and accpet all the possible adverse reactions/side effects.   Do not drive if your were admitted for syncope or siezures until you have seen by Primary MD or a Neurologist and advised to drive.  Do not drive when taking Pain medications.    Do not take more than prescribed Pain, Sleep and Anxiety Medications  Special Instructions: If you have smoked or chewed Tobacco  in the last 2 yrs please stop smoking, stop  any regular Alcohol  and or any Recreational drug use.  Wear Seat belts while driving.   Follow-up Information    Follow up with Brandon Alken, MD. Schedule an appointment as soon as possible for a visit in 1 week.   Contact information:   [address] [place] [address] [phone]       Follow up with Kathryne Hitch, MD. Schedule an appointment as soon as possible for a visit in 2 weeks.   Contact information:   [facility] [address] [place] [address] [phone]            Discharge Medications   Medication List  As of 08/01/2011  1:12 PM   START taking these medications         alum & mag hydroxide-simeth 200-200-20 MG/5ML suspension   Commonly known as: MAALOX/MYLANTA   Take 30 mLs by mouth every 6 (six) hours as needed (dyspepsia).      DSS 100 MG Caps   Take 100 mg by mouth 2 (two) times daily.      oxyCODONE-acetaminophen 5-325 MG per tablet   Commonly known as: PERCOCET/ROXICET   Take 1-2 tablets by mouth every 4 (four) hours as needed for pain.      senna 8.6 MG Tabs   Commonly known as: SENOKOT   Take 1 tablet (8.6 mg total) by mouth 2 (two) times daily.         CONTINUE taking these medications         b complex vitamins tablet      furosemide 40 MG tablet   Commonly known as: LASIX      gabapentin 100 MG capsule   Commonly known as: NEURONTIN      omeprazole 20 MG capsule   Commonly known as: PRILOSEC      Tamsulosin HCl 0.4 MG Caps   Commonly known as: FLOMAX         STOP taking these medications         acetaminophen-codeine 300-30 MG per tablet          Where to get  your medications    These are the prescriptions that you need to pick up.   You may get these medications from any pharmacy.         oxyCODONE-acetaminophen 5-325 MG per tablet         Information on where to get these meds is not yet available. Ask your nurse or doctor.           alum & mag hydroxide-simeth 200-200-20 MG/5ML suspension   DSS 100 MG Caps   senna 8.6 MG Tabs               Total Time in preparing paper work, data evaluation and todays exam - 35 minutes  Storm Sovine M.D on 08/01/2011 at 1:12 PM  [facility] Office  [phone]

## 2011-08-01 NOTE — Progress Notes (Signed)
Physical Therapy Treatment Patient Details Name: Brandon Hardy MRN: [id number] DOB: October 04, 1927 Today's Date: 08/01/2011 Time: 7829-5621 PT Time Calculation (min): 22 min  PT Assessment / Plan / Recommendation Comments on Treatment Session  Pt continues to demonstrate need for assistance with mobility.  Pt is not a candidate for use of rw, will require hand held assist for ambulation.  Rec d/c to SNF for rehab upon discharge.    Follow Up Recommendations  Skilled nursing facility    Barriers to Discharge        Equipment Recommendations  Defer to next venue    Recommendations for Other Services    Frequency Min 5X/week   Plan Discharge plan remains appropriate    Precautions / Restrictions Precautions Precautions: Shoulder Precaution Comments: Pt physician did not explicitly state, NWB but assuming this is true as pt with displaced humerus fracture Other Brace/Splint: Right Shoulder Sling   Pertinent Vitals/Pain 6/10    Mobility  Bed Mobility Bed Mobility: Supine to Sit;Sitting - Scoot to Edge of Bed Supine to Sit: 2: Max assist;HOB elevated;With rails Sitting - Scoot to Edge of Bed: 3: Mod assist Details for Bed Mobility Assistance: Pt limited by fear of pain Transfers Sit to Stand: 3: Mod assist;From bed;With upper extremity assist Stand to Sit: 4: Min assist;With armrests;To chair/3-in-1 Details for Transfer Assistance: pt req lower trunk support for standing to maintain upright posture. Ambulation/Gait Ambulation/Gait Assistance: 4: Min assist Ambulation Distance (Feet): 4 Feet Assistive device: 1 person hand held assist Ambulation/Gait Assistance Details: Pt req min assist for side steps to chair secondary to increased pain and fear  General Gait Details: Side steps to chair          PT Goals Acute Rehab PT Goals PT Goal Formulation: With patient Time For Goal Achievement: 08/07/11 Potential to Achieve Goals: Fair Pt will go Supine/Side to Sit: with min  assist PT Goal: Supine/Side to Sit - Progress: Progressing toward goal Pt will Sit at Edge of Bed: with modified independence PT Goal: Sit at Edge Of Bed - Progress: Progressing toward goal Pt will go Sit to Stand: with min assist PT Goal: Sit to Stand - Progress: Progressing toward goal Pt will go Stand to Sit: with min assist PT Goal: Stand to Sit - Progress: Progressing toward goal Pt will Ambulate: >150 feet;with min assist PT Goal: Ambulate - Progress: Progressing toward goal  Visit Information  Last PT Received On: 08/01/11 Assistance Needed: +1    Subjective Data  Subjective: I'm leaving today Patient Stated Goal: to go home   Cognition  Overall Cognitive Status: Appears within functional limits for tasks assessed/performed Arousal/Alertness: Awake/alert Orientation Level: Oriented X4 / Intact;Appears intact for tasks assessed Behavior During Session: [facility] for tasks performed    Balance     End of Session PT - End of Session Equipment Utilized During Treatment: Gait belt Activity Tolerance: Patient limited by pain Patient left: in chair;with call bell/phone within reach;with nursing in room Nurse Communication: Mobility status   GP     Fabio Asa 08/01/2011, 11:50 AM Charlotte Crumb, PT DPT  [phone]

## 2011-08-01 NOTE — Progress Notes (Signed)
Pt discharged to [facility] in stable condition via EMS.  Report given to nurse receiving patient.

## 2011-09-19 ENCOUNTER — Ambulatory Visit (INDEPENDENT_AMBULATORY_CARE_PROVIDER_SITE_OTHER): Payer: Medicare Other | Admitting: Surgery

## 2011-09-19 ENCOUNTER — Encounter (INDEPENDENT_AMBULATORY_CARE_PROVIDER_SITE_OTHER): Payer: Self-pay | Admitting: Surgery

## 2011-09-19 VITALS — BP 128/72 | HR 98 | Temp 98.2°F | Resp 18 | Ht 66.0 in | Wt 139.8 lb

## 2011-09-19 DIAGNOSIS — R1031 Right lower quadrant pain: Secondary | ICD-10-CM | POA: Insufficient documentation

## 2011-09-19 NOTE — Progress Notes (Signed)
Re:   Brandon Hardy DOB:   1927-09-13 MRN:   [id number]  Urgent Office  ASSESSMENT AND PLAN: 1.  Right groin pain  No obvious hernia on exam.  I don't know if his pain is because he sat on his right testicle.  His limited mobility makes evaluating him more difficult.  I gave him literature on hernias.  His return appointment is PRN.  2.  Neck surgery - 04/11/2011  Surgeon -  Dr. Judie Petit. Cohen.  He has been wheelchair bound since the neck surgery. 3.  BPH - Dr. Mena Goes  Just saw him within the last week.  I don't think he was having the groin pain at that time 4.  Wheelchair since neck surgery.  Gait disturbance. 5.  Trouble swallowing since neck surgery. 6.  Heart murmur and irregular rhythm.  Dr. Anne Fu 7.  Right hydrocele 8.  History of esophageal injury - 1990 approx  Managed by Dr. Randa Evens.  No long term sequelae. 9  Broken right humerus- Dr. Magnus Ivan  Just got out of rehab 18 days ago.  Chief Complaint  Patient presents with  . New Evaluation    hernia-groin   REFERRING PHYSICIAN: Gaye Alken, MD  HISTORY OF PRESENT ILLNESS: Brandon Hardy is a 78 y.o. (DOB: Jul 08, 1927)  white male whose primary care physician is Gaye Alken, MD and comes to me today for possible right inguinal hernia.  He has had a rough year.  He had neck surgery by Dr. Judie Petit. Cohen on 04/11/2011.  Since that time, he has essentially been wheelchair bound, though he can walk with assistance.  Then he fell in August 2013 and broke his right upper arm.  He was in rehab until 18 days ago.  Then he developed right groin pain.  He saw Dr. Zachery Dauer yesterday, who was worried about a right inguinal hernia.  So she sent him to our office for further evaluation.  He has never had a hernia repair or noticed any problems to just recently.    His past GI history includes no history of stomach disease.   No history of gall bladder disease.  No history of pancreas disease.  History of colonoscopy  by Dr. Randa Evens.  He said that he had an "esophageal perforation" around 1990 managed by Dr. Randa Evens.  He used to drink a fair amount, but has no chronic liver disease.   Past Medical History  Diagnosis Date  . Arthritis   . Blood transfusion   . Renal disorder      Past Surgical History  Procedure Date  . Back surgery   . Fracture surgery      Current Outpatient Prescriptions  Medication Sig Dispense Refill  . b complex vitamins tablet Take 1 tablet by mouth daily.      Marland Kitchen gabapentin (NEURONTIN) 100 MG capsule Take 100 mg by mouth 2 (two) times daily.      Marland Kitchen HYDROcodone-acetaminophen (VICODIN) 5-500 MG per tablet Take 1 tablet by mouth every 6 (six) hours as needed. 5-325mg  1-2 tabs [id number]      . omeprazole (PRILOSEC) 20 MG capsule Take 20 mg by mouth daily.      Marland Kitchen senna (SENOKOT) 8.6 MG TABS Take 1 tablet (8.6 mg total) by mouth 2 (two) times daily.  120 each    . furosemide (LASIX) 40 MG tablet Take 40 mg by mouth daily.      . Tamsulosin HCl (FLOMAX) 0.4 MG CAPS Take by mouth daily.  No Known Allergies  REVIEW OF SYSTEMS: Skin:  No history of rash.  No history of abnormal moles. Infection:  No history of hepatitis or HIV.  No history of MRSA. Neurologic:  No history of stroke.  No history of seizure.  No history of headaches. Cardiac:  Sees Dr. Anne Fu for heart murmur and irregular heart rate. Pulmonary:  Does not smoke cigarettes.  No asthma or bronchitis.  No OSA/CPAP.  Endocrine:  No diabetes. No thyroid disease. Gastrointestinal:  See HPI. Urologic:  Dr. Mena Goes sees him for his prostate and actually saw him recently. Musculoskeletal:  He said that he has had 2 neck operations and 6 back operations through the years.  His last surgery was involving the neck by Dr. Noel Gerold.  The patient said he has been wheelchair bound since the surgery.  The neck surgery also affected his swallowing. Hematologic:  No bleeding disorder.  No history of anemia.  Not  anticoagulated. Psycho-social:  The patient is oriented.   The patient has no obvious psychologic or social impairment to understanding our conversation and plan.  SOCIAL and FAMILY HISTORY: Married. Wife is with him.  She is using a cane and has facial evidence of a stroke.  PHYSICAL EXAM: BP 128/72  Pulse 98  Temp 98.2 F (36.8 C) (Temporal)  Resp 18  Ht 5\' 6"  (1.676 m)  Wt 139 lb 12.8 oz (63.413 kg)  BMI 22.56 kg/m2  SpO2 100%  General: WN older WM who is alert.  But he looks old and is wheelchair bound. HEENT: Normal. Pupils equal. Neck: Supple. No mass.  No thyroid mass. Lymph Nodes:  No supraclavicular or cervical nodes. Lungs: Clear to auscultation and symmetric breath sounds. Heart:  Irregular rhythm with 3/6 systolic murmur.  Abdomen: Soft. No mass. No hernia. Normal bowel sounds.  In both a standing and supine position, I feel no obvious evidence of an inguinal hernia.  His immobility makes the exam difficult, but I don't feel anything.  He does appear to have a right scrotal hydrocele, about 5 cm.  He has an atrophic left testicle (mumps when he was young). Rectal: Not done. Extremities:  Right arm in sling for fracture. Neurologic:  Grossly intact to motor and sensory function. Psychiatric: Has normal mood and affect. Behavior is normal.   DATA REVIEWED: Notes from Dr. Zachery Dauer.  Ovidio Kin, MD,  [facility] [address]  [address]   [place], [place]    [address] Phone:  [phone] FAX:  [phone]

## 2011-12-22 ENCOUNTER — Other Ambulatory Visit (HOSPITAL_COMMUNITY): Payer: Self-pay | Admitting: Orthopaedic Surgery

## 2012-01-04 ENCOUNTER — Encounter (HOSPITAL_COMMUNITY)
Admission: RE | Admit: 2012-01-04 | Discharge: 2012-01-04 | Disposition: A | Discharge status: Home / Self Care | Payer: Medicare Other | Source: Ambulatory Visit | Attending: Orthopaedic Surgery | Admitting: Orthopaedic Surgery

## 2012-01-04 ENCOUNTER — Encounter (HOSPITAL_COMMUNITY): Payer: Self-pay

## 2012-01-04 HISTORY — DX: Other chronic pain: G89.29

## 2012-01-04 HISTORY — DX: Cardiac murmur, unspecified: R01.1

## 2012-01-04 HISTORY — DX: Localized edema: R60.0

## 2012-01-04 HISTORY — DX: Unspecified cataract: H26.9

## 2012-01-04 HISTORY — DX: Personal history of other infectious and parasitic diseases: Z86.19

## 2012-01-04 HISTORY — DX: Thrombocytopenia, unspecified: D69.6

## 2012-01-04 HISTORY — DX: Personal history of urinary calculi: Z87.442

## 2012-01-04 HISTORY — DX: Frequency of micturition: R35.0

## 2012-01-04 HISTORY — DX: Gastrointestinal hemorrhage, unspecified: K92.2

## 2012-01-04 HISTORY — DX: Personal history of other medical treatment: Z92.89

## 2012-01-04 HISTORY — DX: Personal history of other venous thrombosis and embolism: Z86.718

## 2012-01-04 HISTORY — DX: Constipation, unspecified: K59.00

## 2012-01-04 HISTORY — DX: Gastro-esophageal reflux disease without esophagitis: K21.9

## 2012-01-04 HISTORY — DX: Dorsalgia, unspecified: M54.9

## 2012-01-04 HISTORY — DX: Edema, unspecified: R60.9

## 2012-01-04 LAB — CBC
HCT: 33.5 % — ABNORMAL LOW (ref 39.0–52.0)
MCHC: 33.4 g/dL (ref 30.0–36.0)
MCV: 101.2 fL — ABNORMAL HIGH (ref 78.0–100.0)
Platelets: 80 10*3/uL — ABNORMAL LOW (ref 150–400)
RDW: 13.2 % (ref 11.5–15.5)

## 2012-01-04 LAB — BASIC METABOLIC PANEL
BUN: 12 mg/dL (ref 6–23)
Creatinine, Ser: 1.04 mg/dL (ref 0.50–1.35)
GFR calc Af Amer: 75 mL/min — ABNORMAL LOW (ref >90)
GFR calc non Af Amer: 65 mL/min — ABNORMAL LOW (ref >90)

## 2012-01-04 LAB — SURGICAL PCR SCREEN: Staphylococcus aureus: NEGATIVE

## 2012-01-04 NOTE — Pre-Procedure Instructions (Signed)
22 Brandon Hardy  01/04/2012   Your procedure is scheduled on:  Tuesday, January 7th  Report to [facility] at 1030 AM.  Call this number if you have problems the morning of surgery: [phone]   Remember:   Do not eat food or drink:After Midnight.   Take these medicines the morning of surgery with A SIP OF WATER: prilosec, pain medication if needed   Do not wear jewelry, make-up or nail polish.  Do not wear lotions, powders, or perfumes.  Do not shave 48 hours prior to surgery. Men may shave face and neck.  Do not bring valuables to the hospital.  Contacts, dentures or bridgework may not be worn into surgery.  Leave suitcase in the car. After surgery it may be brought to your room.  For patients admitted to the hospital, checkout time is 11:00 AM the day of discharge.   Patients discharged the day of surgery will not be allowed to drive home.    Special Instructions: Shower using CHG 2 nights before surgery and the night before surgery.  If you shower the day of surgery use CHG.  Use special wash - you have one bottle of CHG for all showers.  You should use approximately 1/3 of the bottle for each shower.   Please read over the following fact sheets that you were given: Pain Booklet, Coughing and Deep Breathing, MRSA Information and Surgical Site Infection Prevention

## 2012-01-04 NOTE — Progress Notes (Signed)
Dr.skains is cardiologist-to request last visit--only goes on an as needed basis  Stress test to be requested from Dr.SKains as well as echo Denies ever having a heart cath  EKG to be requested from Dr.Skains CXR in epic from 07/2011

## 2012-01-08 MED ORDER — CEFAZOLIN SODIUM-DEXTROSE 2-3 GM-% IV SOLR
2.0000 g | INTRAVENOUS | Status: AC
  Administered 2012-01-09: 2 g via INTRAVENOUS
  Filled 2012-01-08: qty 50

## 2012-01-09 ENCOUNTER — Encounter (HOSPITAL_COMMUNITY): Payer: Self-pay | Admitting: Anesthesiology

## 2012-01-09 ENCOUNTER — Encounter (HOSPITAL_COMMUNITY): Payer: Self-pay

## 2012-01-09 ENCOUNTER — Encounter (HOSPITAL_COMMUNITY)
Admission: RE | Disposition: A | Discharge status: Home / Self Care | Payer: Self-pay | Source: Ambulatory Visit | Attending: Orthopaedic Surgery

## 2012-01-09 ENCOUNTER — Ambulatory Visit (HOSPITAL_COMMUNITY)
Admission: RE | Admit: 2012-01-09 | Discharge: 2012-01-09 | Disposition: A | Discharge status: Home / Self Care | Payer: Medicare Other | Source: Ambulatory Visit | Attending: Orthopaedic Surgery | Admitting: Orthopaedic Surgery

## 2012-01-09 ENCOUNTER — Ambulatory Visit (HOSPITAL_COMMUNITY): Payer: Medicare Other | Admitting: Anesthesiology

## 2012-01-09 DIAGNOSIS — Z01812 Encounter for preprocedural laboratory examination: Secondary | ICD-10-CM | POA: Insufficient documentation

## 2012-01-09 DIAGNOSIS — G5620 Lesion of ulnar nerve, unspecified upper limb: Secondary | ICD-10-CM | POA: Insufficient documentation

## 2012-01-09 DIAGNOSIS — G5621 Lesion of ulnar nerve, right upper limb: Secondary | ICD-10-CM

## 2012-01-09 DIAGNOSIS — K219 Gastro-esophageal reflux disease without esophagitis: Secondary | ICD-10-CM | POA: Insufficient documentation

## 2012-01-09 DIAGNOSIS — Z79899 Other long term (current) drug therapy: Secondary | ICD-10-CM | POA: Insufficient documentation

## 2012-01-09 HISTORY — PX: ULNAR NERVE TRANSPOSITION: SHX2595

## 2012-01-09 SURGERY — ULNAR NERVE DECOMPRESSION/TRANSPOSITION
Anesthesia: General | Site: Elbow | Laterality: Right | Wound class: Clean

## 2012-01-09 MED ORDER — BUPIVACAINE HCL (PF) 0.25 % IJ SOLN
INTRAMUSCULAR | Status: DC | PRN
  Administered 2012-01-09: 10 mL

## 2012-01-09 MED ORDER — ONDANSETRON HCL 4 MG/2ML IJ SOLN: 4.0000 mg | Freq: Once | INTRAMUSCULAR | Status: DC | PRN

## 2012-01-09 MED ORDER — FENTANYL CITRATE 0.05 MG/ML IJ SOLN
INTRAMUSCULAR | Status: DC | PRN
  Administered 2012-01-09 (×2): 50 ug via INTRAVENOUS

## 2012-01-09 MED ORDER — ACETAMINOPHEN 10 MG/ML IV SOLN
1000.0000 mg | Freq: Once | INTRAVENOUS | Status: AC | PRN
  Administered 2012-01-09: 1000 mg via INTRAVENOUS

## 2012-01-09 MED ORDER — HYDROMORPHONE HCL PF 1 MG/ML IJ SOLN
0.2500 mg | INTRAMUSCULAR | Status: DC | PRN
  Administered 2012-01-09 (×2): 0.5 mg via INTRAVENOUS

## 2012-01-09 MED ORDER — 0.9 % SODIUM CHLORIDE (POUR BTL) OPTIME
TOPICAL | Status: DC | PRN
  Administered 2012-01-09: 1000 mL

## 2012-01-09 MED ORDER — GLYCOPYRROLATE 0.2 MG/ML IJ SOLN
INTRAMUSCULAR | Status: DC | PRN
  Administered 2012-01-09: 0.2 mg via INTRAVENOUS

## 2012-01-09 MED ORDER — ACETAMINOPHEN 10 MG/ML IV SOLN
INTRAVENOUS | Status: AC
  Filled 2012-01-09: qty 100

## 2012-01-09 MED ORDER — DEXTROSE 5 % IV SOLN
INTRAVENOUS | Status: DC | PRN
  Administered 2012-01-09: 13:00:00 via INTRAVENOUS

## 2012-01-09 MED ORDER — HYDROMORPHONE HCL PF 1 MG/ML IJ SOLN
INTRAMUSCULAR | Status: AC
  Filled 2012-01-09: qty 1

## 2012-01-09 MED ORDER — LACTATED RINGERS IV SOLN
INTRAVENOUS | Status: DC | PRN
  Administered 2012-01-09 (×2): via INTRAVENOUS

## 2012-01-09 MED ORDER — HYDROCODONE-ACETAMINOPHEN 5-325 MG PO TABS: 1.0000 | ORAL_TABLET | ORAL | Status: DC | PRN

## 2012-01-09 MED ORDER — BUPIVACAINE HCL (PF) 0.25 % IJ SOLN
INTRAMUSCULAR | Status: AC
  Filled 2012-01-09: qty 30

## 2012-01-09 MED ORDER — LACTATED RINGERS IV SOLN
INTRAVENOUS | Status: DC
  Administered 2012-01-09: 13:00:00 via INTRAVENOUS

## 2012-01-09 MED ORDER — ARTIFICIAL TEARS OP OINT
TOPICAL_OINTMENT | OPHTHALMIC | Status: DC | PRN
  Administered 2012-01-09: 1 via OPHTHALMIC

## 2012-01-09 SURGICAL SUPPLY — 47 items
BANDAGE ELASTIC 3 VELCRO ST LF (GAUZE/BANDAGES/DRESSINGS) ×4 IMPLANT
BANDAGE ELASTIC 4 VELCRO ST LF (GAUZE/BANDAGES/DRESSINGS) ×2 IMPLANT
BANDAGE GAUZE ELAST BULKY 4 IN (GAUZE/BANDAGES/DRESSINGS) ×2 IMPLANT
BNDG CMPR 9X4 STRL LF SNTH (GAUZE/BANDAGES/DRESSINGS) ×1
BNDG CMPR MD 5X2 ELC HKLP STRL (GAUZE/BANDAGES/DRESSINGS) ×1
BNDG COHESIVE 3X5 TAN STRL LF (GAUZE/BANDAGES/DRESSINGS) ×1 IMPLANT
BNDG ELASTIC 2 VLCR STRL LF (GAUZE/BANDAGES/DRESSINGS) ×1 IMPLANT
BNDG ESMARK 4X9 LF (GAUZE/BANDAGES/DRESSINGS) ×1 IMPLANT
CLOTH BEACON ORANGE TIMEOUT ST (SAFETY) ×2 IMPLANT
CORDS BIPOLAR (ELECTRODE) ×2 IMPLANT
COVER SURGICAL LIGHT HANDLE (MISCELLANEOUS) ×2 IMPLANT
CUFF TOURNIQUET SINGLE 18IN (TOURNIQUET CUFF) ×2 IMPLANT
CUFF TOURNIQUET SINGLE 24IN (TOURNIQUET CUFF) IMPLANT
DRAIN PENROSE 1/4X12 LTX STRL (WOUND CARE) ×1 IMPLANT
DRAPE SURG 17X23 STRL (DRAPES) ×2 IMPLANT
DRSG PAD ABDOMINAL 8X10 ST (GAUZE/BANDAGES/DRESSINGS) ×1 IMPLANT
DURAPREP 26ML APPLICATOR (WOUND CARE) ×2 IMPLANT
GAUZE XEROFORM 1X8 LF (GAUZE/BANDAGES/DRESSINGS) ×2 IMPLANT
GLOVE ORTHO TXT STRL SZ7.5 (GLOVE) ×3 IMPLANT
GOWN PREVENTION PLUS LG XLONG (DISPOSABLE) IMPLANT
GOWN PREVENTION PLUS XLARGE (GOWN DISPOSABLE) ×2 IMPLANT
GOWN STRL NON-REIN LRG LVL3 (GOWN DISPOSABLE) ×3 IMPLANT
KIT BASIN OR (CUSTOM PROCEDURE TRAY) ×2 IMPLANT
KIT ROOM TURNOVER OR (KITS) ×2 IMPLANT
NDL HYPO 25GX1X1/2 BEV (NEEDLE) IMPLANT
NEEDLE HYPO 25GX1X1/2 BEV (NEEDLE) ×2 IMPLANT
NS IRRIG 1000ML POUR BTL (IV SOLUTION) ×2 IMPLANT
PACK ORTHO EXTREMITY (CUSTOM PROCEDURE TRAY) ×2 IMPLANT
PAD ARMBOARD 7.5X6 YLW CONV (MISCELLANEOUS) ×4 IMPLANT
PAD CAST 4YDX4 CTTN HI CHSV (CAST SUPPLIES) ×2 IMPLANT
PADDING CAST ABS 4INX4YD NS (CAST SUPPLIES) ×1
PADDING CAST ABS COTTON 4X4 ST (CAST SUPPLIES) IMPLANT
PADDING CAST COTTON 4X4 STRL (CAST SUPPLIES) ×4
SPONGE GAUZE 4X4 12PLY (GAUZE/BANDAGES/DRESSINGS) ×2 IMPLANT
STOCKINETTE IMPERVIOUS 9X36 MD (GAUZE/BANDAGES/DRESSINGS) ×1 IMPLANT
SUCTION FRAZIER TIP 10 FR DISP (SUCTIONS) IMPLANT
SUT ETHILON 3 0 PS 1 (SUTURE) ×2 IMPLANT
SUT ETHILON 4 0 PS 2 18 (SUTURE) ×1 IMPLANT
SUT VIC AB 0 CT1 27 (SUTURE) ×2
SUT VIC AB 0 CT1 27XBRD ANBCTR (SUTURE) IMPLANT
SUT VIC AB 2-0 FS1 27 (SUTURE) ×1 IMPLANT
SYR CONTROL 10ML LL (SYRINGE) ×1 IMPLANT
TOWEL OR 17X24 6PK STRL BLUE (TOWEL DISPOSABLE) ×2 IMPLANT
TOWEL OR 17X26 10 PK STRL BLUE (TOWEL DISPOSABLE) ×2 IMPLANT
TUBE CONNECTING 12X1/4 (SUCTIONS) IMPLANT
UNDERPAD 30X30 INCONTINENT (UNDERPADS AND DIAPERS) ×1 IMPLANT
WATER STERILE IRR 1000ML POUR (IV SOLUTION) ×1 IMPLANT

## 2012-01-09 NOTE — Transfer of Care (Signed)
Immediate Anesthesia Transfer of Care Note  Patient: Brandon Hardy  Procedure(s) Performed: Procedure(s) (LRB) with comments: ULNAR NERVE DECOMPRESSION/TRANSPOSITION (Right) - Right ulnar nerve decompression at the elbow/cubital tunnel and transposition  Patient Location: PACU  Anesthesia Type:General  Level of Consciousness: awake, alert , oriented and patient cooperative  Airway & Oxygen Therapy: Patient Spontanous Breathing and Patient connected to nasal cannula oxygen  Post-op Assessment: Report given to PACU RN, Post -op Vital signs reviewed and stable and Patient moving all extremities  Post vital signs: Reviewed and stable  Complications: No apparent anesthesia complications

## 2012-01-09 NOTE — Brief Op Note (Signed)
01/09/2012  2:06 PM  PATIENT:  Brandon Hardy  79 y.o. male  PRE-OPERATIVE DIAGNOSIS:  Right cubital tunnel syndrome  POST-OPERATIVE DIAGNOSIS:  Right cubital tunnel syndrome  PROCEDURE:  Procedure(s) (LRB) with comments: ULNAR NERVE DECOMPRESSION/TRANSPOSITION (Right) - Right ulnar nerve decompression at the elbow/cubital tunnel and transposition  SURGEON:  Surgeon(s) and Role:    * Kathryne Hitch, MD - Primary  PHYSICIAN ASSISTANT:   ASSISTANTS: none   ANESTHESIA:   local and general  EBL:  Total I/O In: 1050 [I.V.:1050] Out: -   BLOOD ADMINISTERED:none  DRAINS: none   LOCAL MEDICATIONS USED:  MARCAINE     SPECIMEN:  No Specimen  DISPOSITION OF SPECIMEN:  N/A  COUNTS:  YES  TOURNIQUET:   Total Tourniquet Time Documented: Upper Arm (Right) - 29 minutes  DICTATION: .Other Dictation: Dictation Number [id number]  PLAN OF CARE: Discharge to home after PACU  PATIENT DISPOSITION:  PACU - hemodynamically stable.   Delay start of Pharmacological VTE agent (>24hrs) due to surgical blood loss or risk of bleeding: not applicable

## 2012-01-09 NOTE — Anesthesia Postprocedure Evaluation (Signed)
  Anesthesia Post-op Note  Patient: Brandon Hardy  Procedure(s) Performed: Procedure(s) (LRB) with comments: ULNAR NERVE DECOMPRESSION/TRANSPOSITION (Right) - Right ulnar nerve decompression at the elbow/cubital tunnel and transposition  Patient Location: PACU  Anesthesia Type:General  Level of Consciousness: awake, alert  and oriented  Airway and Oxygen Therapy: Patient Spontanous Breathing  Post-op Pain: none  Post-op Assessment: Post-op Vital signs reviewed and Patient's Cardiovascular Status Stable  Post-op Vital Signs: stable  Complications: No apparent anesthesia complications

## 2012-01-09 NOTE — Progress Notes (Signed)
Report given to rebecca rn as caregiver 

## 2012-01-09 NOTE — H&P (Signed)
Brandon Hardy is an 79 y.o. male.   Chief Complaint:   Right hand numbness and weakness; known cubital tunnel syndrome HPI:   79 yo male with known worsening cubital tunnel syndrome of his right upper extremity that is causing numbness and weakness.  His fingers are starting to assume a flexed posture.  It is recommended that he undergo a right ulnar nerve decompression at the elbow.  Past Medical History  Diagnosis Date  . Arthritis   . Blood transfusion   . Renal disorder   . Thrombocytopenia   . Heart murmur   . Chronic back pain   . Peripheral edema     was on Lasix but has been off for about 3months  . GERD (gastroesophageal reflux disease)     takes Prilosec daily  . GI bleed     hx of   . Constipation     takes Senna daily and eats Metamucil daily  . Urinary frequency   . History of kidney stones   . History of blood transfusion     no abnormal reaction noted  . Cataracts, bilateral     immature  . History of blood clots     left leg  . History of Clostridium difficile infection     Past Surgical History  Procedure Date  . Fracture surgery   . Back surgery     multiple  . Neck surgery     x 2  . Left finger surgery   . Foot surgery   . Colonoscopy   . Esophagogastroduodenoscopy   . Gi bleed     History reviewed. No pertinent family history. Social History:  reports that he quit smoking about 38 years ago. His smoking use included Cigarettes. He does not have any smokeless tobacco history on file. He reports that he does not drink alcohol or use illicit drugs.  Allergies: No Known Allergies  Medications Prior to Admission  Medication Sig Dispense Refill  . acetaminophen-codeine (TYLENOL #3) 300-30 MG per tablet Take 1 tablet by mouth every 4 (four) hours as needed. For pain      . gabapentin (NEURONTIN) 100 MG capsule Take 100 mg by mouth at bedtime.       Marland Kitchen HYDROcodone-acetaminophen (NORCO/VICODIN) 5-325 MG per tablet Take 1 tablet by mouth daily as  needed. For pain      . Multiple Vitamin (MULTIVITAMIN WITH MINERALS) TABS Take 1 tablet by mouth daily.      Marland Kitchen omeprazole (PRILOSEC) 20 MG capsule Take 20 mg by mouth daily.      Marland Kitchen senna (SENOKOT) 8.6 MG TABS Take 1 tablet (8.6 mg total) by mouth 2 (two) times daily.  120 each      No results found for this or any previous visit (from the past 48 hour(s)). No results found.  Review of Systems  All other systems reviewed and are negative.    Blood pressure 134/75, pulse 61, temperature 97.9 F (36.6 C), temperature source Oral, resp. rate 18, SpO2 97.00%. Physical Exam  Constitutional: He is oriented to person, place, and time. He appears well-developed and well-nourished.  HENT:  Head: Normocephalic and atraumatic.  Eyes: EOM are normal. Pupils are equal, round, and reactive to light.  Neck: Normal range of motion. Neck supple.  Cardiovascular: Normal rate and regular rhythm.   Respiratory: Effort normal and breath sounds normal.  GI: Soft. Bowel sounds are normal.  Musculoskeletal:       Right elbow: tenderness found.  Right hand: decreased sensation noted. Decreased sensation is present in the ulnar distribution. Decreased strength noted. He exhibits thumb/finger opposition.  Neurological: He is alert and oriented to person, place, and time.  Skin: Skin is warm and dry.  Psychiatric: He has a normal mood and affect.     Assessment/Plan Severe cubital tunnel syndrome right upper extremity 1) To the OR today for a right ulnar nerve decompression at the elbow with likely an ulnar nerve transposition.  The risks and benefits have been explained in detail and well understood.  Kathryne Hitch 01/09/2012, 12:25 PM

## 2012-01-09 NOTE — Preoperative (Signed)
Beta Blockers   Reason not to administer Beta Blockers:Not Applicable 

## 2012-01-09 NOTE — Anesthesia Preprocedure Evaluation (Signed)
Anesthesia Evaluation  Patient identified by MRN, date of birth, ID band Patient awake    Reviewed: Allergy & Precautions, NPO status , Patient's Chart, lab work & pertinent test results  Airway Mallampati: II TM Distance: >3 FB     Dental  (+) Edentulous Upper and Edentulous Lower   Pulmonary          Cardiovascular Rhythm:Regular Rate:Normal     Neuro/Psych    GI/Hepatic   Endo/Other    Renal/GU      Musculoskeletal   Abdominal   Peds  Hematology   Anesthesia Other Findings   Reproductive/Obstetrics                           Anesthesia Physical Anesthesia Plan  ASA: III  Anesthesia Plan: General   Post-op Pain Management:    Induction: Intravenous  Airway Management Planned: LMA  Additional Equipment:   Intra-op Plan:   Post-operative Plan: Extubation in OR  Informed Consent: I have reviewed the patients History and Physical, chart, labs and discussed the procedure including the risks, benefits and alternatives for the proposed anesthesia with the patient or authorized representative who has indicated his/her understanding and acceptance.     Plan Discussed with: CRNA and Surgeon  Anesthesia Plan Comments: (R. Ulnar Nerve impingement H/O Cirrhosis with chronic thrombocytopenia GERD (-) Nuclear Stress test 2009  EF 55% by Echo 11/01/10  Plan GA with LMA  Kipp Brood, MD)        Anesthesia Quick Evaluation

## 2012-01-10 ENCOUNTER — Encounter (HOSPITAL_COMMUNITY): Payer: Self-pay | Admitting: Orthopaedic Surgery

## 2012-01-10 NOTE — Op Note (Signed)
NAMEJONAHTAN, MANSEAU               ACCOUNT NO.:  [id number]  MEDICAL RECORD NO.:  [id number]  LOCATION:  [facility]                         FACILITY:  [facility]  PHYSICIAN:  Vanita Panda. Magnus Ivan, M.D.DATE OF BIRTH:  1927-06-22  DATE OF PROCEDURE:  01/09/2012 DATE OF DISCHARGE:  01/09/2012                              OPERATIVE REPORT   PREOPERATIVE DIAGNOSIS:  Severe right upper extremity cubital tunnel syndrome.  POSTOPERATIVE DIAGNOSIS:  Severe right upper extremity cubital tunnel syndrome.  PROCEDURE: 1. Decompression of the right ulnar nerve at the cubital tunnel. 2. Right ulnar nerve subfascial transposition.  ANESTHESIA: 1. General. 2. Local with 0.25% plain Sensorcaine.  TOURNIQUET TIME:  30 minutes.  ANTIBIOTICS:  2 g IV Ancef.  COMPLICATIONS:  None.  BLOOD LOSS:  Minimal.  INDICATIONS:  Mr. Nicklaus is an 79 year old gentleman with ulnar nerve symptoms involving his right upper extremity.  He has a positive Tinel sign of the elbow and is already had surgery to decompress the neck.  He has a double crush phenomenon where he is still having ulnar nerve symptoms at the cubital tunnel.  His fingers started to form a contracted position and was recommended that he have ulnar nerve decompression transposed at the elbow.  The risks and benefits have been explained and is well-understood, and I have him seen by the Orthopedic Spine Surgeon as well.  PROCEDURE DESCRIPTION:  After informed consent was obtained, appropriate right arm was marked.  He was brought to the operating room and placed on the operating table.  General anesthesia was then obtained.  A nonsterile tourniquet was placed on his upper right arm and his right arm was prepped and draped from the upper arm down to the wrist with DuraPrep sterile drapes including sterile stockinette.  A time-out was called and he was identified as correct patient and correct right arm. I then used Esmarch to wrap out the arm and  tourniquet was inflated to 250 mm pressure.  I then made incision directly over the cubital tunnel and carried this proximally and distally.  I dissected down meticulously to expose the ulnar nerve from near its entry to the intermuscular septum down distal, and I meticulously released the nerve at each step, I was able to move the nerve more lateral and anterior, and it was definite loosened up.  You could tell that the nerve was certainly flattened and had a lot of compression at the cubital tunnel.  Next, due to his poor musculature, I performed a subfascial sling and made sure that this was not tight at all.  I used 0 Vicryl to make my sling, so the nerve would sit there and rest easily in the soft tissues.  I could bend his elbow back and forth and held the nerve in place that was not tight.  I then irrigated the soft tissues with normal saline solution. I closed the deep tissue with 0 Vicryl followed by 2-0 Vicryl in subcutaneous tissue, interrupted 3-0 nylon on the skin.  I infiltrated the incisions with 0.25% plain Sensorcaine.  Xeroform followed by a sterile dressing was applied.  He was awakened, extubated, and taken to recovery room in stable condition.  All final  counts correct.  There were no complications noted.     Vanita Panda. Magnus Ivan, M.D.     CYB/MEDQ  D:  01/09/2012  T:  01/10/2012  Job:  [id number]

## 2012-04-12 ENCOUNTER — Encounter (INDEPENDENT_AMBULATORY_CARE_PROVIDER_SITE_OTHER): Payer: Self-pay | Admitting: Surgery

## 2012-04-12 ENCOUNTER — Ambulatory Visit (INDEPENDENT_AMBULATORY_CARE_PROVIDER_SITE_OTHER): Payer: Medicare Other | Admitting: Surgery

## 2012-04-12 VITALS — BP 128/62 | HR 60 | Temp 98.6°F | Resp 18 | Ht 68.0 in | Wt 150.0 lb

## 2012-04-12 DIAGNOSIS — R1031 Right lower quadrant pain: Secondary | ICD-10-CM

## 2012-04-12 NOTE — Progress Notes (Signed)
 Re:   Brandon Hardy DOB:   01/31/1927 MRN:   [id number]  ASSESSMENT AND PLAN: 1.  Right groin pain  Probable right groin hernia.  He is not in a wheel chair this time, but still moves very slowly, with a cane.  I discussed the indications and complications of hernia surgery with the patient.  I discussed both the laparoscopic and open approach to hernia repair.  Because of his pannus, thrombocytopenia, and his other medical problems, I do not think that he is a good laparoscopic candidate.  The potential risks of hernia surgery include, but are not limited to, bleeding, infection, open surgery, nerve injury, and recurrence of the hernia.  I provided the patient literature about hernia surgery.  2.  Neck surgery - 04/11/2011  Surgeon -  Dr. M. Cohen.  He sees Dr. Cohen at the end of the month. 3.  BPH - Dr. Eskridge  Dr. Eskridge put in his note about a right inguinal hernia. 4.  Neck surgery.  Gait disturbance since the surgery, but he is walking and using a cane.  Last time I saw him, he was in a wheel chair. 5.  Heart murmur and irregular rhythm.  Dr. Skains saw him for clear before his neck surgery last year. 6.  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 Edwards.  No long term sequelae. 7  Right ulnar nerve decompression - Dr. Blackman - 01/19/2012 8.    He used to weight >200 pounds, but now weighs about 150.  His weight is up about 1 pounds since I last saw him.  Large pannus 9. Thrombocytopenia - Plts - 80,000 - 01/04/2012  History of EtOH abuse  He knows about the thrombocytopenia, but he has never been given an etiology.  Chief Complaint  Patient presents with  . Establish Care    R/H   REFERRING PHYSICIAN: BARNES,ELIZABETH STEWART, MD  HISTORY OF PRESENT ILLNESS: Brandon Hardy is a 79 y.o. (DOB: 07/10/1927)  white male whose primary care physician is BARNES,ELIZABETH STEWART, MD and comes to me today for possible right  inguinal hernia.  I saw him 09/19/2011 for right groin pain.  He saw Dr. Eskridge on 03/12/2012 who noted a right inguinal hernia.  (note that in Dr. Eskridge's PE, he does not mention a hernia, but mentions it in his plan)  The patient notices that when he sits on a firm surface, he has pain in the right groin area.  Though he still moves slowly and needs a cane, he is clearly more active than when I saw him last September, 2013.  He's never had abdominal surgery.  Summary of 09/19/2011 visit: He has had a rough year - 2013.  He had neck surgery by Dr. M. Cohen on 04/11/2011.  Since that time, he has essentially been wheelchair bound, though he can walk with assistance.  Then he fell in August 2013 and broke his right upper arm.  He was in rehab until 18 days ago.  Then he developed right groin pain.  He saw Dr. Barnes yesterday, who was worried about a right inguinal hernia.  So she sent him to our office for further evaluation.  He has never had a hernia repair or noticed any problems to just recently.    His past GI history includes no history of stomach disease.   No history of gall bladder disease.  No history of pancreas disease.  History of   colonoscopy by Dr. Edwards.  He said that he had an "esophageal perforation" around 1990 managed by Dr. Edwards.  He used to drink a lot of EtOH, but has since quit.   Past Medical History  Diagnosis Date  . Arthritis   . Blood transfusion   . Renal disorder   . Thrombocytopenia   . Heart murmur   . Chronic back pain   . Peripheral edema     was on Lasix but has been off for about 3months  . GERD (gastroesophageal reflux disease)     takes Prilosec daily  . GI bleed     hx of   . Constipation     takes Senna daily and eats Metamucil daily  . Urinary frequency   . History of kidney stones   . History of blood transfusion     no abnormal reaction noted  . Cataracts, bilateral     immature  . History of blood clots     left leg  . History of  Clostridium difficile infection      Past Surgical History  Procedure Laterality Date  . Fracture surgery    . Back surgery      multiple  . Neck surgery      x 2  . Left finger surgery    . Foot surgery    . Colonoscopy    . Esophagogastroduodenoscopy    . Gi bleed    . Ulnar nerve transposition  01/09/2012    Procedure: ULNAR NERVE DECOMPRESSION/TRANSPOSITION;  Surgeon: Christopher Y Blackman, MD;  Location: [facility] OR;  Service: Orthopedics;  Laterality: Right;  Right ulnar nerve decompression at the elbow/cubital tunnel and transposition     Current Outpatient Prescriptions  Medication Sig Dispense Refill  . acetaminophen-codeine (TYLENOL #3) 300-30 MG per tablet       . Multiple Vitamin (MULTIVITAMIN WITH MINERALS) TABS Take 1 tablet by mouth daily.      . omeprazole (PRILOSEC) 20 MG capsule Take 20 mg by mouth daily.      . senna (SENOKOT) 8.6 MG TABS Take 1 tablet (8.6 mg total) by mouth 2 (two) times daily.  120 each    . clobetasol (TEMOVATE) 0.05 % external solution       . gabapentin (NEURONTIN) 100 MG capsule Take 100 mg by mouth at bedtime.       . HYDROcodone-acetaminophen (NORCO) 5-325 MG per tablet Take 1-2 tablets by mouth every 4 (four) hours as needed for pain.  60 tablet  0  . HYDROcodone-acetaminophen (NORCO/VICODIN) 5-325 MG per tablet Take 1 tablet by mouth daily as needed. For pain       No current facility-administered medications for this visit.     No Known Allergies  REVIEW OF SYSTEMS: Skin:  No history of rash.  No history of abnormal moles. Infection:  No history of hepatitis or HIV.  No history of MRSA. Neurologic:  No history of stroke.  No history of seizure.  No history of headaches. Cardiac:  Sees Dr. Skains for heart murmur and irregular heart rate. Pulmonary:  Does not smoke cigarettes.  No asthma or bronchitis.  No OSA/CPAP.  Endocrine:  No diabetes. No thyroid disease. Gastrointestinal:  See HPI. Urologic:  Dr. Eskridge sees him for his  prostate and actually saw him recently. Musculoskeletal:  He said that he has had 2 neck operations and 6 back operations through the years.  His last surgery was involving the neck by Dr. Cohen.  The patient   said he has been wheelchair bound since the surgery.  The neck surgery also affected his swallowing. Hematologic:  No bleeding disorder.  No history of anemia.  Not anticoagulated. Psycho-social:  The patient is oriented.   The patient has no obvious psychologic or social impairment to understanding our conversation and plan.  SOCIAL and FAMILY HISTORY: Married. Wife is with him.  She is using a cane and has facial evidence of a stroke.  PHYSICAL EXAM: Ht 5' 8" (1.727 m)  Wt 150 lb (68.04 kg)  BMI 22.81 kg/m2  General: WN older WM who is alert.  He moves very slowly, but is not in a wheelchair. HEENT: Normal. Pupils equal. Neck: Supple. No mass.  No thyroid mass. Lymph Nodes:  No supraclavicular or cervical nodes. Lungs: Clear to auscultation and symmetric breath sounds. Heart: RRR, 3/6 systolic murmur.  Abdomen: Soft. No mass. No hernia. Normal bowel sounds.    Prominent pannus. In a standing position, he seems to have a hernia in the right groin that goes into the scrotum.  This disappears when he lays down.  Because of his pannus and anatomy, he is hard to examine. Rectal: Not done. Extremities:  Right arm doing better.  Still walks with a cane. Neurologic:  Grossly intact to motor and sensory function. Psychiatric: Has normal mood and affect. Behavior is normal.   DATA REVIEWED: Notes from Dr. Barnes.  Jermario Kalmar, MD,  FACS [facility] [address]  [address]   [place], [place]    [address] Phone:  [phone] FAX:  [phone]  

## 2012-04-15 ENCOUNTER — Encounter (HOSPITAL_COMMUNITY): Payer: Self-pay

## 2012-04-17 ENCOUNTER — Telehealth (INDEPENDENT_AMBULATORY_CARE_PROVIDER_SITE_OTHER): Payer: Self-pay

## 2012-04-17 ENCOUNTER — Other Ambulatory Visit: Payer: Self-pay

## 2012-04-17 ENCOUNTER — Encounter (HOSPITAL_COMMUNITY): Payer: Self-pay

## 2012-04-17 ENCOUNTER — Encounter (HOSPITAL_COMMUNITY)
Admission: RE | Admit: 2012-04-17 | Discharge: 2012-04-17 | Disposition: A | Discharge status: Home / Self Care | Payer: Medicare Other | Source: Ambulatory Visit | Attending: Surgery | Admitting: Surgery

## 2012-04-17 HISTORY — DX: Benign prostatic hyperplasia without lower urinary tract symptoms: N40.0

## 2012-04-17 LAB — COMPREHENSIVE METABOLIC PANEL
AST: 30 U/L (ref 0–37)
Albumin: 2.9 g/dL — ABNORMAL LOW (ref 3.5–5.2)
Calcium: 9 mg/dL (ref 8.4–10.5)
Creatinine, Ser: 1.08 mg/dL (ref 0.50–1.35)
GFR calc non Af Amer: 62 mL/min — ABNORMAL LOW (ref >90)
Total Protein: 6 g/dL (ref 6.0–8.3)

## 2012-04-17 LAB — SURGICAL PCR SCREEN
MRSA, PCR: NEGATIVE
Staphylococcus aureus: NEGATIVE

## 2012-04-17 LAB — CBC WITH DIFFERENTIAL/PLATELET
Basophils Absolute: 0 10*3/uL (ref 0.0–0.1)
Eosinophils Absolute: 0.2 10*3/uL (ref 0.0–0.7)
Lymphs Abs: 0.9 10*3/uL (ref 0.7–4.0)
MCH: 33.5 pg (ref 26.0–34.0)
MCV: 101 fL — ABNORMAL HIGH (ref 78.0–100.0)
Monocytes Absolute: 0.5 10*3/uL (ref 0.1–1.0)
Platelets: 86 10*3/uL — ABNORMAL LOW (ref 150–400)
RDW: 13.3 % (ref 11.5–15.5)

## 2012-04-17 LAB — PROTIME-INR: INR: 1.29 (ref 0.00–1.49)

## 2012-04-17 NOTE — Progress Notes (Signed)
Dr Ezzard Standing-   Patient refuses to take Hibiclins showers pre op- stated that "It made blisters the last time I used it for my arm surgery". I instructed him to take DIAL ANTIBACTERIAL showers x 2. Just FYI  Thanks

## 2012-04-17 NOTE — Progress Notes (Signed)
Chest 7/13 EPIC

## 2012-04-17 NOTE — Telephone Encounter (Signed)
Patient aware of appt with Dr. Ezzard Standing  05/09/12@ 12n He will be here 15 min early

## 2012-04-17 NOTE — Progress Notes (Signed)
LOV Dr Zachery Dauer 3/14 chart,last EKG 4/13 on chart,OV Dr Anne Fu 3/13 chart, eccho 10/12 chart

## 2012-04-17 NOTE — Patient Instructions (Addendum)
20 Brandon Hardy  04/17/2012   Your procedure is scheduled on:  04/23/12  TUESDAY  Report to [facility] at 1200   NOON  Call this number if you have problems the morning of surgery: [phone]     TAKE SHOWER Monday NIGHT AND Tuesday MORNING WITH DIAL SOAP, USE CLEAN TOWEL AND WASHCLOTH AND PUT CLEAN CLOTHES ON TO COME TO HOSPITAL.  NO LOTIONS OR POWDERS  Remember:   Do not eat food After Midnight. Monday night-    MAY HAVE CLEAR LIQUIDS UNTIL 0830 AM  Tuesday MORNING,  THEN NONE  By MOUTH   Take these medicines the morning of surgery with A SIP OF WATER:  NEURONTIN,  PROLISEC                                           MAY TAKE NORCO IF NEEDED FOR PAIN   .  Contacts, dentures or partial plates can not be worn to surgery  Leave suitcase in the car. After surgery it may be brought to your room.  For patients admitted to the hospital, checkout time is 11:00 AM day of  discharge.           M AY SHAVE FACE.  Patients discharged the day of surgery will not be allowed to drive home. IF going home the day of surgery, you must have a driver and someone to stay with you for the first 24 hours  Name and phone number of your driver:       Will have have arranged by morning of surgery                                                                 Please read over the following fact sheets that you were given: MRSA Information,                                                                                   [name]  PST [phone]  [phone]                 FAILURE TO FOLLOW THESE INSTRUCTIONS MAY RESULT IN  CANCELLATION   OF YOUR SURGERY                                                  Patient Signature _____________________________

## 2012-04-22 NOTE — Progress Notes (Signed)
CALLED PT AND MADE AWARE SURGERY TIME  CHANGED TO 1000, ARRIVE 0730 AM [facility] SHORT STAY, PT STATES PROBABLY CANNOT ARRIVE UNTIL 0800 AM DUE TO RIDE SITUATION, NPO AFTER MIDNIGHT

## 2012-04-23 ENCOUNTER — Encounter (HOSPITAL_COMMUNITY)
Admission: RE | Disposition: A | Discharge status: Home / Self Care | Payer: Self-pay | Source: Ambulatory Visit | Attending: Surgery

## 2012-04-23 ENCOUNTER — Encounter (HOSPITAL_COMMUNITY): Payer: Self-pay | Admitting: Anesthesiology

## 2012-04-23 ENCOUNTER — Ambulatory Visit (HOSPITAL_COMMUNITY): Payer: Medicare Other | Admitting: Anesthesiology

## 2012-04-23 ENCOUNTER — Encounter (HOSPITAL_COMMUNITY): Payer: Self-pay | Admitting: *Deleted

## 2012-04-23 ENCOUNTER — Ambulatory Visit (HOSPITAL_COMMUNITY)
Admission: RE | Admit: 2012-04-23 | Discharge: 2012-04-23 | Disposition: A | Discharge status: Home / Self Care | Payer: Medicare Other | Source: Ambulatory Visit | Attending: Surgery | Admitting: Surgery

## 2012-04-23 DIAGNOSIS — N400 Enlarged prostate without lower urinary tract symptoms: Secondary | ICD-10-CM | POA: Insufficient documentation

## 2012-04-23 DIAGNOSIS — R269 Unspecified abnormalities of gait and mobility: Secondary | ICD-10-CM | POA: Insufficient documentation

## 2012-04-23 DIAGNOSIS — K4090 Unilateral inguinal hernia, without obstruction or gangrene, not specified as recurrent: Secondary | ICD-10-CM | POA: Insufficient documentation

## 2012-04-23 DIAGNOSIS — R011 Cardiac murmur, unspecified: Secondary | ICD-10-CM | POA: Insufficient documentation

## 2012-04-23 DIAGNOSIS — F1011 Alcohol abuse, in remission: Secondary | ICD-10-CM | POA: Insufficient documentation

## 2012-04-23 DIAGNOSIS — D696 Thrombocytopenia, unspecified: Secondary | ICD-10-CM | POA: Insufficient documentation

## 2012-04-23 DIAGNOSIS — M129 Arthropathy, unspecified: Secondary | ICD-10-CM | POA: Insufficient documentation

## 2012-04-23 DIAGNOSIS — R1031 Right lower quadrant pain: Secondary | ICD-10-CM

## 2012-04-23 DIAGNOSIS — Z86718 Personal history of other venous thrombosis and embolism: Secondary | ICD-10-CM | POA: Insufficient documentation

## 2012-04-23 DIAGNOSIS — K219 Gastro-esophageal reflux disease without esophagitis: Secondary | ICD-10-CM | POA: Insufficient documentation

## 2012-04-23 HISTORY — PX: INSERTION OF MESH: SHX5868

## 2012-04-23 HISTORY — PX: INGUINAL HERNIA REPAIR: SHX194

## 2012-04-23 SURGERY — REPAIR, HERNIA, INGUINAL, ADULT
Anesthesia: General | Site: Groin | Laterality: Right | Wound class: Clean

## 2012-04-23 MED ORDER — HYDROCODONE-ACETAMINOPHEN 5-325 MG PO TABS: 1.0000 | ORAL_TABLET | Freq: Four times a day (QID) | ORAL | Status: DC | PRN

## 2012-04-23 MED ORDER — GLYCOPYRROLATE 0.2 MG/ML IJ SOLN
INTRAMUSCULAR | Status: DC | PRN
  Administered 2012-04-23: 0.2 mg via INTRAVENOUS

## 2012-04-23 MED ORDER — ONDANSETRON HCL 4 MG/2ML IJ SOLN
INTRAMUSCULAR | Status: DC | PRN
  Administered 2012-04-23: 4 mg via INTRAVENOUS

## 2012-04-23 MED ORDER — FENTANYL CITRATE 0.05 MG/ML IJ SOLN
INTRAMUSCULAR | Status: DC | PRN
  Administered 2012-04-23 (×4): 25 ug via INTRAVENOUS

## 2012-04-23 MED ORDER — DIPHENHYDRAMINE HCL 50 MG/ML IJ SOLN
12.5000 mg | Freq: Once | INTRAMUSCULAR | Status: AC
  Administered 2012-04-23: 12.5 mg via INTRAVENOUS

## 2012-04-23 MED ORDER — HYDROCODONE-ACETAMINOPHEN 5-325 MG PO TABS
1.0000 | ORAL_TABLET | Freq: Four times a day (QID) | ORAL | Status: DC | PRN
  Administered 2012-04-23: 1 via ORAL
  Filled 2012-04-23: qty 1

## 2012-04-23 MED ORDER — BUPIVACAINE-EPINEPHRINE 0.25% -1:200000 IJ SOLN
INTRAMUSCULAR | Status: AC
  Filled 2012-04-23: qty 1

## 2012-04-23 MED ORDER — LACTATED RINGERS IV SOLN
INTRAVENOUS | Status: DC
  Administered 2012-04-23: 10:00:00 via INTRAVENOUS

## 2012-04-23 MED ORDER — LACTATED RINGERS IV SOLN: INTRAVENOUS | Status: DC

## 2012-04-23 MED ORDER — BUPIVACAINE LIPOSOME 1.3 % IJ SUSP
20.0000 mL | Freq: Once | INTRAMUSCULAR | Status: DC
  Filled 2012-04-23: qty 20

## 2012-04-23 MED ORDER — CEFAZOLIN SODIUM-DEXTROSE 2-3 GM-% IV SOLR
INTRAVENOUS | Status: AC
  Filled 2012-04-23: qty 50

## 2012-04-23 MED ORDER — PROPOFOL 10 MG/ML IV BOLUS
INTRAVENOUS | Status: DC | PRN
  Administered 2012-04-23: 100 mg via INTRAVENOUS

## 2012-04-23 MED ORDER — 0.9 % SODIUM CHLORIDE (POUR BTL) OPTIME
TOPICAL | Status: DC | PRN
  Administered 2012-04-23: 1000 mL

## 2012-04-23 MED ORDER — BUPIVACAINE LIPOSOME 1.3 % IJ SUSP
INTRAMUSCULAR | Status: DC | PRN
Start: 2012-04-23 — End: 2012-04-23
  Administered 2012-04-23: 20 mL

## 2012-04-23 MED ORDER — CEFAZOLIN SODIUM-DEXTROSE 2-3 GM-% IV SOLR
2.0000 g | INTRAVENOUS | Status: AC
  Administered 2012-04-23: 2 g via INTRAVENOUS

## 2012-04-23 MED ORDER — FENTANYL CITRATE 0.05 MG/ML IJ SOLN
INTRAMUSCULAR | Status: AC
  Filled 2012-04-23: qty 2

## 2012-04-23 MED ORDER — BUPIVACAINE HCL (PF) 0.25 % IJ SOLN
INTRAMUSCULAR | Status: AC
  Filled 2012-04-23: qty 30

## 2012-04-23 MED ORDER — DIPHENHYDRAMINE HCL 50 MG/ML IJ SOLN
INTRAMUSCULAR | Status: AC
  Filled 2012-04-23: qty 1

## 2012-04-23 MED ORDER — FENTANYL CITRATE 0.05 MG/ML IJ SOLN
25.0000 ug | INTRAMUSCULAR | Status: DC | PRN
  Administered 2012-04-23 (×2): 50 ug via INTRAVENOUS

## 2012-04-23 SURGICAL SUPPLY — 45 items
ADH SKN CLS APL DERMABOND .7 (GAUZE/BANDAGES/DRESSINGS) ×2
APL SKNCLS STERI-STRIP NONHPOA (GAUZE/BANDAGES/DRESSINGS)
BENZOIN TINCTURE PRP APPL 2/3 (GAUZE/BANDAGES/DRESSINGS) ×1 IMPLANT
BLADE HEX COATED 2.75 (ELECTRODE) ×2 IMPLANT
BLADE SURG 15 STRL LF DISP TIS (BLADE) IMPLANT
BLADE SURG 15 STRL SS (BLADE)
BLADE SURG SZ10 CARB STEEL (BLADE) ×4 IMPLANT
CANISTER SUCTION 2500CC (MISCELLANEOUS) ×1 IMPLANT
CLOTH BEACON ORANGE TIMEOUT ST (SAFETY) ×2 IMPLANT
DECANTER SPIKE VIAL GLASS SM (MISCELLANEOUS) ×1 IMPLANT
DERMABOND ADVANCED (GAUZE/BANDAGES/DRESSINGS) ×2
DERMABOND ADVANCED .7 DNX12 (GAUZE/BANDAGES/DRESSINGS) IMPLANT
DISSECTOR ROUND CHERRY 3/8 STR (MISCELLANEOUS) ×1 IMPLANT
DRAIN PENROSE 18X1/2 LTX STRL (DRAIN) ×2 IMPLANT
DRAPE LAPAROTOMY TRNSV 102X78 (DRAPE) ×2 IMPLANT
ELECT REM PT RETURN 9FT ADLT (ELECTROSURGICAL) ×2
ELECTRODE REM PT RTRN 9FT ADLT (ELECTROSURGICAL) ×1 IMPLANT
GLOVE BIOGEL PI IND STRL 7.0 (GLOVE) ×1 IMPLANT
GLOVE BIOGEL PI INDICATOR 7.0 (GLOVE) ×1
GLOVE SURG SIGNA 7.5 PF LTX (GLOVE) ×2 IMPLANT
GOWN STRL NON-REIN LRG LVL3 (GOWN DISPOSABLE) ×1 IMPLANT
GOWN STRL REIN XL XLG (GOWN DISPOSABLE) ×5 IMPLANT
KIT BASIN OR (CUSTOM PROCEDURE TRAY) ×2 IMPLANT
MESH ULTRAPRO 3X6 7.6X15CM (Mesh General) ×1 IMPLANT
NDL HYPO 25X1 1.5 SAFETY (NEEDLE) ×1 IMPLANT
NEEDLE HYPO 25X1 1.5 SAFETY (NEEDLE) ×2 IMPLANT
NS IRRIG 1000ML POUR BTL (IV SOLUTION) ×2 IMPLANT
PACK BASIC VI WITH GOWN DISP (CUSTOM PROCEDURE TRAY) ×2 IMPLANT
PENCIL BUTTON HOLSTER BLD 10FT (ELECTRODE) ×2 IMPLANT
SPONGE GAUZE 4X4 12PLY (GAUZE/BANDAGES/DRESSINGS) ×1 IMPLANT
SPONGE LAP 18X18 X RAY DECT (DISPOSABLE) ×3 IMPLANT
SPONGE LAP 4X18 X RAY DECT (DISPOSABLE) IMPLANT
STRIP CLOSURE SKIN 1/2X4 (GAUZE/BANDAGES/DRESSINGS) ×1 IMPLANT
SUT CHROMIC 0 SH (SUTURE) IMPLANT
SUT CHROMIC 2 0 TIES 18 (SUTURE) IMPLANT
SUT MON AB 5-0 PS2 18 (SUTURE) ×2 IMPLANT
SUT NOVA 0 T19/GS 22DT (SUTURE) ×7 IMPLANT
SUT VIC AB 0 CT2 27 (SUTURE) ×2 IMPLANT
SUT VIC AB 2-0 SH 27 (SUTURE) ×2
SUT VIC AB 2-0 SH 27X BRD (SUTURE) ×1 IMPLANT
SUT VIC AB 3-0 SH 18 (SUTURE) ×3 IMPLANT
SYR BULB IRRIGATION 50ML (SYRINGE) ×1 IMPLANT
SYR CONTROL 10ML LL (SYRINGE) ×2 IMPLANT
TOWEL OR 17X26 10 PK STRL BLUE (TOWEL DISPOSABLE) ×2 IMPLANT
YANKAUER SUCT BULB TIP 10FT TU (MISCELLANEOUS) ×2 IMPLANT

## 2012-04-23 NOTE — Progress Notes (Signed)
Patient began itching on left arm- small areas of hives noted- patient not taken to SHORT STAY at this time

## 2012-04-23 NOTE — H&P (View-Only) (Signed)
Re:   Brandon Hardy DOB:   January 08, 1927 MRN:   [id number]  ASSESSMENT AND PLAN: 1.  Right groin pain  Probable right groin hernia.  He is not in a wheel chair this time, but still moves very slowly, with a cane.  I discussed the indications and complications of hernia surgery with the patient.  I discussed both the laparoscopic and open approach to hernia repair.  Because of his pannus, thrombocytopenia, and his other medical problems, I do not think that he is a good laparoscopic candidate.  The potential risks of hernia surgery include, but are not limited to, bleeding, infection, open surgery, nerve injury, and recurrence of the hernia.  I provided the patient literature about hernia surgery.  2.  Neck surgery - 04/11/2011  Surgeon -  Dr. Judie Petit. Cohen.  He sees Dr. Noel Gerold at the end of the month. 3.  BPH - Dr. Mena Goes  Dr. Mena Goes put in his note about a right inguinal hernia. 4.  Neck surgery.  Gait disturbance since the surgery, but he is walking and using a cane.  Last time I saw him, he was in a wheel chair. 5.  Heart murmur and irregular rhythm.  Dr. Anne Fu saw him for clear before his neck surgery last year. 6.  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 Randa Evens.  No long term sequelae. 7  Right ulnar nerve decompression - Dr. Magnus Ivan - 01/19/2012 8.    He used to weight >200 pounds, but now weighs about 150.  His weight is up about 1 pounds since I last saw him.  Large pannus 9. Thrombocytopenia - Plts - 80,000 - 01/04/2012  History of EtOH abuse  He knows about the thrombocytopenia, but he has never been given an etiology.  Chief Complaint  Patient presents with  . Establish Care    R/H   REFERRING PHYSICIAN: Gaye Alken, MD  HISTORY OF PRESENT ILLNESS: Brandon Hardy is a 79 y.o. (DOB: 18-Dec-1927)  white male whose primary care physician is Gaye Alken, MD and comes to me today for possible right  inguinal hernia.  I saw him 09/19/2011 for right groin pain.  He saw Dr. Mena Goes on 03/12/2012 who noted a right inguinal hernia.  (note that in Dr. Estil Daft PE, he does not mention a hernia, but mentions it in his plan)  The patient notices that when he sits on a firm surface, he has pain in the right groin area.  Though he still moves slowly and needs a cane, he is clearly more active than when I saw him last September, 2013.  He's never had abdominal surgery.  Summary of 09/19/2011 visit: He has had a rough year - 2013.  He had neck surgery by Dr. Judie Petit. Cohen on 04/11/2011.  Since that time, he has essentially been wheelchair bound, though he can walk with assistance.  Then he fell in August 2013 and broke his right upper arm.  He was in rehab until 18 days ago.  Then he developed right groin pain.  He saw Dr. Zachery Dauer yesterday, who was worried about a right inguinal hernia.  So she sent him to our office for further evaluation.  He has never had a hernia repair or noticed any problems to just recently.    His past GI history includes no history of stomach disease.   No history of gall bladder disease.  No history of pancreas disease.  History of  colonoscopy by Dr. Randa Evens.  He said that he had an "esophageal perforation" around 1990 managed by Dr. Randa Evens.  He used to drink a lot of EtOH, but has since quit.   Past Medical History  Diagnosis Date  . Arthritis   . Blood transfusion   . Renal disorder   . Thrombocytopenia   . Heart murmur   . Chronic back pain   . Peripheral edema     was on Lasix but has been off for about 3months  . GERD (gastroesophageal reflux disease)     takes Prilosec daily  . GI bleed     hx of   . Constipation     takes Senna daily and eats Metamucil daily  . Urinary frequency   . History of kidney stones   . History of blood transfusion     no abnormal reaction noted  . Cataracts, bilateral     immature  . History of blood clots     left leg  . History of  Clostridium difficile infection      Past Surgical History  Procedure Laterality Date  . Fracture surgery    . Back surgery      multiple  . Neck surgery      x 2  . Left finger surgery    . Foot surgery    . Colonoscopy    . Esophagogastroduodenoscopy    . Gi bleed    . Ulnar nerve transposition  01/09/2012    Procedure: ULNAR NERVE DECOMPRESSION/TRANSPOSITION;  Surgeon: Kathryne Hitch, MD;  Location: [facility] OR;  Service: Orthopedics;  Laterality: Right;  Right ulnar nerve decompression at the elbow/cubital tunnel and transposition     Current Outpatient Prescriptions  Medication Sig Dispense Refill  . acetaminophen-codeine (TYLENOL #3) 300-30 MG per tablet       . Multiple Vitamin (MULTIVITAMIN WITH MINERALS) TABS Take 1 tablet by mouth daily.      Marland Kitchen omeprazole (PRILOSEC) 20 MG capsule Take 20 mg by mouth daily.      Marland Kitchen senna (SENOKOT) 8.6 MG TABS Take 1 tablet (8.6 mg total) by mouth 2 (two) times daily.  120 each    . clobetasol (TEMOVATE) 0.05 % external solution       . gabapentin (NEURONTIN) 100 MG capsule Take 100 mg by mouth at bedtime.       Marland Kitchen HYDROcodone-acetaminophen (NORCO) 5-325 MG per tablet Take 1-2 tablets by mouth every 4 (four) hours as needed for pain.  60 tablet  0  . HYDROcodone-acetaminophen (NORCO/VICODIN) 5-325 MG per tablet Take 1 tablet by mouth daily as needed. For pain       No current facility-administered medications for this visit.     No Known Allergies  REVIEW OF SYSTEMS: Skin:  No history of rash.  No history of abnormal moles. Infection:  No history of hepatitis or HIV.  No history of MRSA. Neurologic:  No history of stroke.  No history of seizure.  No history of headaches. Cardiac:  Sees Dr. Anne Fu for heart murmur and irregular heart rate. Pulmonary:  Does not smoke cigarettes.  No asthma or bronchitis.  No OSA/CPAP.  Endocrine:  No diabetes. No thyroid disease. Gastrointestinal:  See HPI. Urologic:  Dr. Mena Goes sees him for his  prostate and actually saw him recently. Musculoskeletal:  He said that he has had 2 neck operations and 6 back operations through the years.  His last surgery was involving the neck by Dr. Noel Gerold.  The patient  said he has been wheelchair bound since the surgery.  The neck surgery also affected his swallowing. Hematologic:  No bleeding disorder.  No history of anemia.  Not anticoagulated. Psycho-social:  The patient is oriented.   The patient has no obvious psychologic or social impairment to understanding our conversation and plan.  SOCIAL and FAMILY HISTORY: Married. Wife is with him.  She is using a cane and has facial evidence of a stroke.  PHYSICAL EXAM: Ht 5\' 8"  (1.727 m)  Wt 150 lb (68.04 kg)  BMI 22.81 kg/m2  General: WN older WM who is alert.  He moves very slowly, but is not in a wheelchair. HEENT: Normal. Pupils equal. Neck: Supple. No mass.  No thyroid mass. Lymph Nodes:  No supraclavicular or cervical nodes. Lungs: Clear to auscultation and symmetric breath sounds. Heart: RRR, 3/6 systolic murmur.  Abdomen: Soft. No mass. No hernia. Normal bowel sounds.    Prominent pannus. In a standing position, he seems to have a hernia in the right groin that goes into the scrotum.  This disappears when he lays down.  Because of his pannus and anatomy, he is hard to examine. Rectal: Not done. Extremities:  Right arm doing better.  Still walks with a cane. Neurologic:  Grossly intact to motor and sensory function. Psychiatric: Has normal mood and affect. Behavior is normal.   DATA REVIEWED: Notes from Dr. Zachery Dauer.  Ovidio Kin, MD,  [facility] [place] [address] [place]  [address]   [place], [place]    [address] Phone:  [phone] FAX:  [phone]

## 2012-04-23 NOTE — Progress Notes (Signed)
Disposable blood pressure cuff used starting at 1400- right arm

## 2012-04-23 NOTE — Progress Notes (Signed)
Patient feeling better- not scratching at this time-hives resolving.

## 2012-04-23 NOTE — Progress Notes (Signed)
Dr. Leta Jungling notified of patient's itching- and small areas of hives- Orders given.

## 2012-04-23 NOTE — Progress Notes (Signed)
HIVES ON LEFT ARM DISAPPEARED- SKIN HAS PINKISH-COLORED AREAS- ITCHING HAS STOPPED.

## 2012-04-23 NOTE — Transfer of Care (Signed)
Immediate Anesthesia Transfer of Care Note  Patient: Brandon Hardy  Procedure(s) Performed: Procedure(s) (LRB): HERNIA REPAIR INGUINAL ADULT (Right) INSERTION OF MESH (Right)  Patient Location: PACU  Anesthesia Type: General  Level of Consciousness: sedated, patient cooperative and responds to stimulaton  Airway & Oxygen Therapy: Patient Spontanous Breathing and Patient connected to face mask oxgen  Post-op Assessment: Report given to PACU RN and Post -op Vital signs reviewed and stable  Post vital signs: Reviewed and stable  Complications: No apparent anesthesia complications

## 2012-04-23 NOTE — Anesthesia Preprocedure Evaluation (Addendum)
Anesthesia Evaluation  Patient identified by MRN, date of birth, ID band Patient awake    Reviewed: Allergy & Precautions, H&P , NPO status , Patient's Chart, lab work & pertinent test results  Airway Mallampati: II TM Distance: >3 FB Neck ROM: full    Dental  (+) Edentulous Upper and Edentulous Lower   Pulmonary neg pulmonary ROS,  breath sounds clear to auscultation  Pulmonary exam normal       Cardiovascular Exercise Tolerance: Good negative cardio ROS  Rhythm:regular Rate:Normal     Neuro/Psych negative neurological ROS  negative psych ROS   GI/Hepatic negative GI ROS, Neg liver ROS, GERD-  Medicated and Controlled,  Endo/Other  negative endocrine ROS  Renal/GU negative Renal ROS  negative genitourinary   Musculoskeletal   Abdominal   Peds  Hematology negative hematology ROS (+) Blood dyscrasia, anemia , Hx. Thrombocytopenia. 84k.  Anemia Hgb 10.4   Anesthesia Other Findings   Reproductive/Obstetrics negative OB ROS                          Anesthesia Physical Anesthesia Plan  ASA: II  Anesthesia Plan: General   Post-op Pain Management:    Induction: Intravenous  Airway Management Planned: LMA  Additional Equipment:   Intra-op Plan:   Post-operative Plan:   Informed Consent: I have reviewed the patients History and Physical, chart, labs and discussed the procedure including the risks, benefits and alternatives for the proposed anesthesia with the patient or authorized representative who has indicated his/her understanding and acceptance.   Dental Advisory Given  Plan Discussed with: CRNA and Surgeon  Anesthesia Plan Comments:        Anesthesia Quick Evaluation

## 2012-04-23 NOTE — Op Note (Signed)
04/23/2012  12:08 PM  PATIENT:  Brandon Hardy, 79 y.o., male, MRN: [id number]  PREOP DIAGNOSIS:  right inguinal hernia  POSTOP DIAGNOSIS:   Chronic indirect right inguinal hernia  PROCEDURE:   Procedure(s):  Open HERNIA REPAIR INGUINAL ADULT, INSERTION OF MESH  SURGEON:   Ovidio Kin, M.D.  ANESTHESIA:   general  Anesthesiologist: Gaetano Hawthorne, MD CRNA: Paris Lore, CRNA  General  EBL:  minimal  ml  LOCAL MEDICATIONS USED:   20 cc Exparel  SPECIMEN:   Hernia sac  COUNTS CORRECT:  YES  INDICATIONS FOR PROCEDURE:  Brandon Hardy is a 79 y.o. (DOB: [birth year]) white  male whose primary care physician is Gaye Alken, MD and comes for open repair of right inguinal hernia.  The patient is hard to examine because of his limited mobility, but I think that he has a reducible right inguinal hernia.   The indications and risks of the hernia surgery were explained to the patient.  The risks include, but are not limited to, infection, bleeding, recurrence of the hernia, and nerve injury.  Operative Note: The patient was taken to room number 1 at [facility] OR.  He underwent a general anesthesia.    A time out was held and the surgical checklist run.  His lower abdomen was shaved and then prepped with betadine (the patient claims an allergy to chlorhexidine).  A right inguinal incision was made through the subcutaneous fat to the external oblique fascia.  The external ring was opened and the cord structures encircled with a penrose drain.  The patient had an indirect right hernia.  The hernia is chronic and had a lot of scarring around the sac.  The hernia protruded about 12 cm with a diameter of about 1.5 cm.  The inguinal floor looked okay.  The inguinal floor was repaired with a 3 x 6 inch piece of Ultrapro mesh.  The mesh was cut to fit the inguinal floor.  The mesh was sewn in place with interrupted 0 Novafil suture.  A key hole was made for the internal ring.  The  mesh lay flat.  The inguinal floor was covered and the internal ring recreated.  The cord structures were returned to a normal location.  The external oblique was closed with a 3-0 vicryl.  The fascia and subcutaneous tissues were infiltrated with 20 cc of Exparel (the Exparel was mixed with 10 cc of saline for a 30 cc mixture).  The skin was closed with 5-0 vicryl and painted with Dermabond. The sponge and needle count were correct at the end of the case.  The patient was transported to the recovery room in good condition.  The patient will go home today.  Discharge instructions reviewed.  Ovidio Kin, MD, [facility] Pager: [phone] Office phone:  [phone]

## 2012-04-23 NOTE — Interval H&P Note (Signed)
History and Physical Interval Note:  04/23/2012 10:11 AM  Brandon Hardy  has presented today for surgery, with the diagnosis of right inguinal hernia  The various methods of treatment have been discussed with the patient and family.  Wife is here.  Patient is ready for surgery.   After consideration of risks, benefits and other options for treatment, the patient has consented to  Procedure(s): HERNIA REPAIR INGUINAL ADULT (Right) as a surgical intervention .    The patient's history has been reviewed, patient examined, no change in status, stable for surgery.  I have reviewed the patient's chart and labs.  Questions were answered to the patient's satisfaction.     [name] H

## 2012-04-23 NOTE — Progress Notes (Signed)
Benadryl 12.5mg  IVP GIVEN AS ORDERED.

## 2012-04-23 NOTE — Anesthesia Postprocedure Evaluation (Signed)
  Anesthesia Post-op Note  Patient: Brandon Hardy  Procedure(s) Performed: Procedure(s) (LRB): HERNIA REPAIR INGUINAL ADULT (Right) INSERTION OF MESH (Right)  Patient Location: PACU  Anesthesia Type: General  Level of Consciousness: awake and alert   Airway and Oxygen Therapy: Patient Spontanous Breathing  Post-op Pain: mild  Post-op Assessment: Post-op Vital signs reviewed, Patient's Cardiovascular Status Stable, Respiratory Function Stable, Patent Airway and No signs of Nausea or vomiting  Last Vitals:  Filed Vitals:   04/23/12 1300  BP: 133/49  Pulse: 50  Temp:   Resp: 14    Post-op Vital Signs: stable   Complications: No apparent anesthesia complications

## 2012-04-23 NOTE — Progress Notes (Signed)
Dr. Leta Jungling made aware of patient's heart rates being in the 40s-50s

## 2012-04-23 NOTE — Anesthesia Postprocedure Evaluation (Signed)
  Anesthesia Post-op Note  Patient: Brandon Hardy  Procedure(s) Performed: Procedure(s) (LRB): HERNIA REPAIR INGUINAL ADULT (Right) INSERTION OF MESH (Right)  Patient Location: PACU  Anesthesia Type: General  Level of Consciousness: awake and alert   Airway and Oxygen Therapy: Patient Spontanous Breathing  Post-op Pain: mild  Post-op Assessment: Post-op Vital signs reviewed, Patient's Cardiovascular Status Stable, Respiratory Function Stable, Patent Airway and No signs of Nausea or vomiting  Last Vitals:  Filed Vitals:   04/23/12 1245  BP: 133/52  Pulse: 54  Temp:   Resp: 17    Post-op Vital Signs: stable   Complications: No apparent anesthesia complications

## 2012-04-24 ENCOUNTER — Encounter (HOSPITAL_COMMUNITY): Payer: Self-pay | Admitting: Surgery

## 2012-05-09 ENCOUNTER — Encounter (INDEPENDENT_AMBULATORY_CARE_PROVIDER_SITE_OTHER): Payer: Medicare Other | Admitting: Surgery

## 2012-05-09 ENCOUNTER — Ambulatory Visit (INDEPENDENT_AMBULATORY_CARE_PROVIDER_SITE_OTHER): Payer: Medicare Other | Admitting: Surgery

## 2012-05-09 VITALS — BP 130/76 | HR 78 | Temp 98.0°F | Resp 18 | Ht 68.0 in | Wt 142.0 lb

## 2012-05-09 DIAGNOSIS — R1031 Right lower quadrant pain: Secondary | ICD-10-CM

## 2012-05-09 NOTE — Progress Notes (Addendum)
Re:   Brandon Hardy DOB:   Aug 10, 1927 MRN:   [id number]  ASSESSMENT AND PLAN: 1.  Right groin hernia, indirect.  Open repair 04/23/2012  Doing well though sore.  I gave him a copy of his path report.  Return PRN.  [Wrote one prescription for Vicodin, #20, no refills.  This is his last refill without being seen again.  DN  07/23/2012]  2.  Neck surgery - 04/11/2011  Surgeon -  Dr. Judie Petit. Cohen.  He sees Dr. Noel Gerold at the end of the month. 3.  BPH - Dr. Mena Goes  Dr. Mena Goes put in his note about a right inguinal hernia. 4.  Neck surgery.  Gait disturbance since the surgery, but he is walking and using a cane.  Last time I saw him, he was in a wheel chair. 5.  Heart murmur and irregular rhythm.  Dr. Anne Fu saw him for clear before his neck surgery last year. 6.  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 Randa Evens.  No long term sequelae. 7  Right ulnar nerve decompression - Dr. Magnus Ivan - 01/19/2012 8.    He used to weight >200 pounds, but now weighs about 150.  His weight is up about 1 pounds since I last saw him.  Large pannus 9. Thrombocytopenia - Plts - 80,000 - 01/04/2012  History of EtOH abuse  He knows about the thrombocytopenia, but he has never been given an etiology.  Chief Complaint  Patient presents with  . Routine Post Op    hernia   REFERRING PHYSICIAN: Gaye Alken, MD  HISTORY OF PRESENT ILLNESS: Brandon Hardy is a 79 y.o. (DOB: 06-07-1927)  white male whose primary care physician is Gaye Alken, MD and comes to me today for follow up of right inguinal hernia repair.  His wife is with him.  He is still having pain, but most of it seems post op.  He is slow to get up, but over all everything looks good.  He wants to mow his grass and I want him to wait until almost 4 weeks post op.  Summary of hernia symptoms: He has had a rough year - 2013.  He had neck surgery by Dr. Judie Petit. Cohen on 04/11/2011.   Since that time, he has essentially been wheelchair bound, though he can walk with assistance.  Then he fell in August 2013 and broke his right upper arm.  He was in rehab until 18 days ago.  Then he developed right groin pain.  He saw Dr. Zachery Dauer yesterday, who was worried about a right inguinal hernia.  So she sent him to our office for further evaluation.  I saw him 09/19/2011 for right groin pain.  He saw Dr. Mena Goes on 03/12/2012 who noted a right inguinal hernia.  (note that in Dr. Estil Daft PE, he does not mention a hernia, but mentions it in his plan)  The patient notices that when he sits on a firm surface, he has pain in the right groin area.  Though he still moves slowly and needs a cane, he is clearly more active than when I saw him last September, 2013.  He's never had abdominal surgery.  His past GI history includes no history of stomach disease.   No history of gall bladder disease.  No history of pancreas disease.  History of colonoscopy by Dr. Randa Evens.  He said that he had an "esophageal perforation" around 1990 managed by Dr. Randa Evens.  He used to drink a lot of EtOH, but has since quit.   Past Medical History  Diagnosis Date  . Arthritis   . Blood transfusion   . Renal disorder   . Thrombocytopenia   . Heart murmur   . Chronic back pain   . Peripheral edema     was on Lasix but has been off for about 3months  . GERD (gastroesophageal reflux disease)     takes Prilosec daily  . GI bleed     hx of   . Constipation     takes Senna daily and eats Metamucil daily  . Urinary frequency   . History of kidney stones   . History of blood transfusion     no abnormal reaction noted  . Cataracts, bilateral     immature  . History of blood clots     left leg  . History of Clostridium difficile infection   . BPH (benign prostatic hyperplasia)       Current Outpatient Prescriptions  Medication Sig Dispense Refill  . acetaminophen-codeine (TYLENOL #3) 300-30 MG per tablet Take 1  tablet by mouth every 4 (four) hours as needed.       . clobetasol (TEMOVATE) 0.05 % external solution Topically to scalp,ears, abdomen      . gabapentin (NEURONTIN) 100 MG capsule Take 100 mg by mouth 2 (two) times daily.       Marland Kitchen HYDROcodone-acetaminophen (NORCO) 5-325 MG per tablet Take 1-2 tablets by mouth every 4 (four) hours as needed for pain.  60 tablet  0  . HYDROcodone-acetaminophen (NORCO/VICODIN) 5-325 MG per tablet Take 1-2 tablets by mouth every 6 (six) hours as needed for pain.  30 tablet  1  . Multiple Vitamin (MULTIVITAMIN WITH MINERALS) TABS Take 1 tablet by mouth daily.      Marland Kitchen omeprazole (PRILOSEC) 20 MG capsule Take 20 mg by mouth daily.      Marland Kitchen senna (SENOKOT) 8.6 MG TABS Take 1 tablet by mouth daily.       No current facility-administered medications for this visit.      Allergies  Allergen Reactions  . Chlorhexidine Rash    REVIEW OF SYSTEMS: Cardiac:  Sees Dr. Anne Fu for heart murmur and irregular heart rate. Gastrointestinal:  See HPI. Urologic:  Dr. Mena Goes sees him for his prostate and actually saw him recently. Musculoskeletal:  He said that he has had 2 neck operations and 6 back operations through the years.  His last surgery was involving the neck by Dr. Noel Gerold.  The patient said he has been wheelchair bound since the surgery.  The neck surgery also affected his swallowing.   SOCIAL and FAMILY HISTORY: Married. Wife is with him.  She is using a cane and has facial evidence of a stroke.  PHYSICAL EXAM: BP 130/76  Pulse 78  Temp(Src) 98 F (36.7 C)  Resp 18  Ht 5\' 8"  (1.727 m)  Wt 142 lb (64.411 kg)  BMI 21.6 kg/m2  General: WN older WM who is alert.  He moves very slowly, but is not in a wheelchair. HEENT: Normal. Pupils equal. Abdomen: Right groin incision looks good.  He has some redundant skin/pannus.  No right scrotal or testicular problems.  DATA REVIEWED: Path to patient  Ovidio Kin, MD,  [facility], [place] [address] [place]  [address]   [place], [place]    [address] Phone:  [phone] FAX:  [phone]

## 2012-07-23 ENCOUNTER — Telehealth (INDEPENDENT_AMBULATORY_CARE_PROVIDER_SITE_OTHER): Payer: Self-pay

## 2012-07-23 ENCOUNTER — Other Ambulatory Visit (INDEPENDENT_AMBULATORY_CARE_PROVIDER_SITE_OTHER): Payer: Self-pay

## 2012-07-23 NOTE — Telephone Encounter (Signed)
Dr. Ezzard Standing authorized vicodin 5/325 mg 1 po q 6hrs pain # 20  refill  . Called to [facility]   [phone]

## 2012-08-22 DIAGNOSIS — I639 Cerebral infarction, unspecified: Secondary | ICD-10-CM

## 2012-08-22 HISTORY — DX: Cerebral infarction, unspecified: I63.9

## 2012-08-23 ENCOUNTER — Inpatient Hospital Stay (HOSPITAL_COMMUNITY): Payer: Medicare Other

## 2012-08-23 ENCOUNTER — Emergency Department (HOSPITAL_COMMUNITY): Payer: Medicare Other

## 2012-08-23 ENCOUNTER — Inpatient Hospital Stay (HOSPITAL_COMMUNITY)
Admission: EM | Admit: 2012-08-23 | Discharge: 2012-08-28 | DRG: 065 | Disposition: A | Discharge status: Rehabilitation Facility | Payer: Medicare Other | Attending: Internal Medicine | Admitting: Internal Medicine

## 2012-08-23 ENCOUNTER — Encounter (HOSPITAL_COMMUNITY): Payer: Self-pay | Admitting: Emergency Medicine

## 2012-08-23 DIAGNOSIS — Q2111 Secundum atrial septal defect: Secondary | ICD-10-CM

## 2012-08-23 DIAGNOSIS — N400 Enlarged prostate without lower urinary tract symptoms: Secondary | ICD-10-CM | POA: Diagnosis present

## 2012-08-23 DIAGNOSIS — Z87891 Personal history of nicotine dependence: Secondary | ICD-10-CM

## 2012-08-23 DIAGNOSIS — Z79899 Other long term (current) drug therapy: Secondary | ICD-10-CM

## 2012-08-23 DIAGNOSIS — R600 Localized edema: Secondary | ICD-10-CM

## 2012-08-23 DIAGNOSIS — I639 Cerebral infarction, unspecified: Secondary | ICD-10-CM

## 2012-08-23 DIAGNOSIS — F1010 Alcohol abuse, uncomplicated: Secondary | ICD-10-CM | POA: Diagnosis present

## 2012-08-23 DIAGNOSIS — M129 Arthropathy, unspecified: Secondary | ICD-10-CM | POA: Diagnosis present

## 2012-08-23 DIAGNOSIS — M6281 Muscle weakness (generalized): Secondary | ICD-10-CM | POA: Diagnosis present

## 2012-08-23 DIAGNOSIS — G459 Transient cerebral ischemic attack, unspecified: Secondary | ICD-10-CM | POA: Diagnosis present

## 2012-08-23 DIAGNOSIS — K219 Gastro-esophageal reflux disease without esophagitis: Secondary | ICD-10-CM | POA: Diagnosis present

## 2012-08-23 DIAGNOSIS — Z87442 Personal history of urinary calculi: Secondary | ICD-10-CM

## 2012-08-23 DIAGNOSIS — R269 Unspecified abnormalities of gait and mobility: Secondary | ICD-10-CM | POA: Diagnosis present

## 2012-08-23 DIAGNOSIS — D696 Thrombocytopenia, unspecified: Secondary | ICD-10-CM | POA: Diagnosis present

## 2012-08-23 DIAGNOSIS — D649 Anemia, unspecified: Secondary | ICD-10-CM | POA: Diagnosis present

## 2012-08-23 DIAGNOSIS — K5900 Constipation, unspecified: Secondary | ICD-10-CM | POA: Diagnosis present

## 2012-08-23 DIAGNOSIS — Q211 Atrial septal defect: Secondary | ICD-10-CM

## 2012-08-23 DIAGNOSIS — E876 Hypokalemia: Secondary | ICD-10-CM | POA: Diagnosis present

## 2012-08-23 DIAGNOSIS — IMO0002 Reserved for concepts with insufficient information to code with codable children: Secondary | ICD-10-CM

## 2012-08-23 DIAGNOSIS — G8190 Hemiplegia, unspecified affecting unspecified side: Secondary | ICD-10-CM | POA: Diagnosis present

## 2012-08-23 DIAGNOSIS — G8929 Other chronic pain: Secondary | ICD-10-CM | POA: Diagnosis present

## 2012-08-23 DIAGNOSIS — R1031 Right lower quadrant pain: Secondary | ICD-10-CM

## 2012-08-23 DIAGNOSIS — H269 Unspecified cataract: Secondary | ICD-10-CM | POA: Diagnosis present

## 2012-08-23 DIAGNOSIS — R29810 Facial weakness: Secondary | ICD-10-CM | POA: Diagnosis present

## 2012-08-23 DIAGNOSIS — I6350 Cerebral infarction due to unspecified occlusion or stenosis of unspecified cerebral artery: Principal | ICD-10-CM | POA: Diagnosis present

## 2012-08-23 DIAGNOSIS — I359 Nonrheumatic aortic valve disorder, unspecified: Secondary | ICD-10-CM

## 2012-08-23 DIAGNOSIS — M549 Dorsalgia, unspecified: Secondary | ICD-10-CM | POA: Diagnosis present

## 2012-08-23 HISTORY — DX: Cerebral infarction, unspecified: I63.9

## 2012-08-23 HISTORY — DX: Esophageal varices with bleeding: I85.01

## 2012-08-23 HISTORY — DX: Atrial septal defect: Q21.1

## 2012-08-23 HISTORY — DX: Thrombocytopenia, unspecified: D69.6

## 2012-08-23 LAB — DIFFERENTIAL
Lymphocytes Relative: 15 % (ref 12–46)
Monocytes Absolute: 0.6 10*3/uL (ref 0.1–1.0)
Monocytes Relative: 11 % (ref 3–12)
Neutro Abs: 4 10*3/uL (ref 1.7–7.7)

## 2012-08-23 LAB — CBC
HCT: 31.6 % — ABNORMAL LOW (ref 39.0–52.0)
Hemoglobin: 10.7 g/dL — ABNORMAL LOW (ref 13.0–17.0)
RBC: 3.13 MIL/uL — ABNORMAL LOW (ref 4.22–5.81)
WBC: 5.7 10*3/uL (ref 4.0–10.5)

## 2012-08-23 LAB — COMPREHENSIVE METABOLIC PANEL
AST: 26 U/L (ref 0–37)
Alkaline Phosphatase: 67 U/L (ref 39–117)
BUN: 17 mg/dL (ref 6–23)
CO2: 27 mEq/L (ref 19–32)
Chloride: 110 mEq/L (ref 96–112)
Creatinine, Ser: 1.11 mg/dL (ref 0.50–1.35)
GFR calc non Af Amer: 60 mL/min — ABNORMAL LOW (ref >90)
Potassium: 3.3 mEq/L — ABNORMAL LOW (ref 3.5–5.1)
Total Bilirubin: 1.3 mg/dL — ABNORMAL HIGH (ref 0.3–1.2)

## 2012-08-23 LAB — URINALYSIS, ROUTINE W REFLEX MICROSCOPIC
Hgb urine dipstick: NEGATIVE
Leukocytes, UA: NEGATIVE
Protein, ur: NEGATIVE mg/dL
Specific Gravity, Urine: 1.014 (ref 1.005–1.030)
Urobilinogen, UA: 1 mg/dL (ref 0.0–1.0)

## 2012-08-23 LAB — RAPID URINE DRUG SCREEN, HOSP PERFORMED
Opiates: POSITIVE — AB
Tetrahydrocannabinol: NOT DETECTED

## 2012-08-23 LAB — GLUCOSE, CAPILLARY: Glucose-Capillary: 85 mg/dL (ref 70–99)

## 2012-08-23 LAB — APTT: aPTT: 32 seconds (ref 24–37)

## 2012-08-23 LAB — TROPONIN I: Troponin I: <0.3 ng/mL (ref <0.30)

## 2012-08-23 MED ORDER — POTASSIUM CHLORIDE CRYS ER 20 MEQ PO TBCR
40.0000 meq | EXTENDED_RELEASE_TABLET | ORAL | Status: AC
  Administered 2012-08-23 (×2): 40 meq via ORAL
  Filled 2012-08-23 (×2): qty 2

## 2012-08-23 MED ORDER — ASPIRIN 81 MG PO CHEW
324.0000 mg | CHEWABLE_TABLET | Freq: Once | ORAL | Status: AC
  Administered 2012-08-23: 324 mg via ORAL
  Filled 2012-08-23: qty 4

## 2012-08-23 MED ORDER — ONDANSETRON HCL 4 MG/2ML IJ SOLN: 4.0000 mg | Freq: Four times a day (QID) | INTRAMUSCULAR | Status: DC | PRN

## 2012-08-23 MED ORDER — PANTOPRAZOLE SODIUM 40 MG PO TBEC
40.0000 mg | DELAYED_RELEASE_TABLET | Freq: Every day | ORAL | Status: DC
  Administered 2012-08-24 – 2012-08-28 (×4): 40 mg via ORAL
  Filled 2012-08-23 (×5): qty 1

## 2012-08-23 MED ORDER — DOCUSATE SODIUM 100 MG PO CAPS
100.0000 mg | ORAL_CAPSULE | Freq: Two times a day (BID) | ORAL | Status: DC
  Administered 2012-08-23 – 2012-08-28 (×10): 100 mg via ORAL
  Filled 2012-08-23 (×10): qty 1

## 2012-08-23 MED ORDER — GABAPENTIN 100 MG PO CAPS
100.0000 mg | ORAL_CAPSULE | Freq: Two times a day (BID) | ORAL | Status: DC
  Administered 2012-08-23 – 2012-08-28 (×10): 100 mg via ORAL
  Filled 2012-08-23 (×13): qty 1

## 2012-08-23 MED ORDER — ALUM & MAG HYDROXIDE-SIMETH 200-200-20 MG/5ML PO SUSP
15.0000 mL | Freq: Four times a day (QID) | ORAL | Status: DC | PRN
  Administered 2012-08-23 – 2012-08-28 (×4): 15 mL via ORAL
  Filled 2012-08-23 (×5): qty 30

## 2012-08-23 MED ORDER — PREDNISONE 10 MG PO TABS
10.0000 mg | ORAL_TABLET | Freq: Every day | ORAL | Status: DC
  Administered 2012-08-23: 10 mg via ORAL
  Filled 2012-08-23: qty 1

## 2012-08-23 MED ORDER — ACETAMINOPHEN-CODEINE #3 300-30 MG PO TABS
1.0000 | ORAL_TABLET | Freq: Once | ORAL | Status: AC
  Administered 2012-08-23: 1 via ORAL
  Filled 2012-08-23: qty 1

## 2012-08-23 MED ORDER — SENNOSIDES-DOCUSATE SODIUM 8.6-50 MG PO TABS: 1.0000 | ORAL_TABLET | Freq: Every evening | ORAL | Status: DC | PRN

## 2012-08-23 MED ORDER — SODIUM CHLORIDE 0.9 % IV SOLN
INTRAVENOUS | Status: DC
  Administered 2012-08-23 – 2012-08-25 (×4): via INTRAVENOUS

## 2012-08-23 MED ORDER — PREDNISONE 20 MG PO TABS
20.0000 mg | ORAL_TABLET | Freq: Every day | ORAL | Status: DC
  Administered 2012-08-24 – 2012-08-28 (×5): 20 mg via ORAL
  Filled 2012-08-23 (×8): qty 1

## 2012-08-23 MED ORDER — ACETAMINOPHEN 325 MG PO TABS: 650.0000 mg | ORAL_TABLET | ORAL | Status: DC | PRN

## 2012-08-23 MED ORDER — ACETAMINOPHEN 650 MG RE SUPP: 650.0000 mg | RECTAL | Status: DC | PRN

## 2012-08-23 MED ORDER — VITAMIN B-2 100 MG PO TABS: 100.0000 mg | ORAL_TABLET | Freq: Every day | ORAL | Status: DC

## 2012-08-23 NOTE — ED Provider Notes (Signed)
Medical screening examination/treatment/procedure(s) were performed by non-physician practitioner and as supervising physician I was immediately available for consultation/collaboration.  Patient presents to emergency room with complaints of weakness. The symptoms started yesterday. He noticed that he was having trouble walking his usual yard work.  On exam today, the patient appears to have a left-sided facial droop. He also has weakness of his left upper extremity.  The symptoms are concerning for the possibility of a stroke. The patient is not a TPA candidate if the symptom onset began yesterday.  We will proceed with the ED evaluation, the patient most likely hospitalization for further evaluation.  Celene Kras, MD 08/23/12 [phone]

## 2012-08-23 NOTE — ED Notes (Signed)
Plan of care updated to pt.  Pt. Is aware of needing urine specimen.  Pt. Instructed to call out when he can urinate. Warm blanket given

## 2012-08-23 NOTE — ED Notes (Signed)
Spoke with IV nurse reported that pt. Needs an IV

## 2012-08-23 NOTE — ED Notes (Signed)
Per EMS - pt c/o generalized weakness. Left arm weaker than normal. sts he noticed it yesterday afternoon. No hx of stroke, has hx of degenerative disorder that has caused him to have left sided weakness. Pt sts he was doing yard work yesterday and just felt more weak afterwards. BP 132/76 HR 48-90s NSR. 99% on 2 liter/min.

## 2012-08-23 NOTE — ED Provider Notes (Signed)
CSN: [id number]     Arrival date & time 08/23/12  0804 History     First MD Initiated Contact with Patient 08/23/12 [id number]     Chief Complaint  Patient presents with  . Fatigue   (Consider location/radiation/quality/duration/timing/severity/associated sxs/prior Treatment) HPI  79 year old male with history of GI bleed requiring blood transfusion in the past, heart murmur, renal disorder presents for evaluations of fatigue.  Patient reports he lives at home with his wife. He he usually does yard work on a daily basis. Yesterday in the afternoon after he was doing yard work he went inside to use the bathroom. Subsequently he noticed that he was having difficulty moving his arms or button his pants.  He also endorsed generalized weakness afterward. He uses a cane to walk but felt much weaker. Symptom has been persistent throughout the day. Patient states "I do not feel like myself ". He decided to call EMS to bring him to the ER today. Patient otherwise denies fever, chills, headache, vision changes, slurred speech, difficulty talking, chest pain, shortness of breath, back pain, abdominal pain, nausea, vomiting, diarrhea, dysuria, lightheadedness, dizziness. Endorse bilateral hand numbness secondary to "problem with my neck" however this is not new. No prior history of stroke. No specific treatment tried. Patient does not take any blood thinner medication.  Past Medical History  Diagnosis Date  . Arthritis   . Blood transfusion   . Renal disorder   . Thrombocytopenia   . Heart murmur   . Chronic back pain   . Peripheral edema     was on Lasix but has been off for about 3months  . GERD (gastroesophageal reflux disease)     takes Prilosec daily  . GI bleed     hx of   . Constipation     takes Senna daily and eats Metamucil daily  . Urinary frequency   . History of kidney stones   . History of blood transfusion     no abnormal reaction noted  . Cataracts, bilateral     immature  .  History of blood clots     left leg  . History of Clostridium difficile infection   . BPH (benign prostatic hyperplasia)    Past Surgical History  Procedure Laterality Date  . Fracture surgery    . Back surgery      multiple  . Neck surgery  2013    x 2/plates, screws, decreased mobility  . Left finger surgery    . Foot surgery    . Colonoscopy    . Esophagogastroduodenoscopy    . Gi bleed    . Ulnar nerve transposition  01/09/2012    Procedure: ULNAR NERVE DECOMPRESSION/TRANSPOSITION;  Surgeon: Kathryne Hitch, MD;  Location: [facility] OR;  Service: Orthopedics;  Laterality: Right;  Right ulnar nerve decompression at the elbow/cubital tunnel and transposition  . Inguinal hernia repair Right 04/23/2012    Procedure: HERNIA REPAIR INGUINAL ADULT;  Surgeon: Kandis Cocking, MD;  Location: [facility] ORS;  Service: General;  Laterality: Right;  . Insertion of mesh Right 04/23/2012    Procedure: INSERTION OF MESH;  Surgeon: Kandis Cocking, MD;  Location: [facility] ORS;  Service: General;  Laterality: Right;   No family history on file. History  Substance Use Topics  . Smoking status: Former Smoker    Types: Cigarettes    Quit date: 02/03/1973  . Smokeless tobacco: Never Used  . Alcohol Use: No    Review of Systems  All other systems  reviewed and are negative.    Allergies  Chlorhexidine  Home Medications   Current Outpatient Rx  Name  Route  Sig  Dispense  Refill  . acetaminophen-codeine (TYLENOL #3) 300-30 MG per tablet   Oral   Take 1 tablet by mouth every 4 (four) hours as needed.          . clobetasol (TEMOVATE) 0.05 % external solution      Topically to scalp,ears, abdomen         . gabapentin (NEURONTIN) 100 MG capsule   Oral   Take 100 mg by mouth 2 (two) times daily.          Marland Kitchen HYDROcodone-acetaminophen (NORCO) 5-325 MG per tablet   Oral   Take 1-2 tablets by mouth every 4 (four) hours as needed for pain.   60 tablet   0   . HYDROcodone-acetaminophen  (NORCO/VICODIN) 5-325 MG per tablet   Oral   Take 1-2 tablets by mouth every 6 (six) hours as needed for pain.   30 tablet   1   . Multiple Vitamin (MULTIVITAMIN WITH MINERALS) TABS   Oral   Take 1 tablet by mouth daily.         Marland Kitchen omeprazole (PRILOSEC) 20 MG capsule   Oral   Take 20 mg by mouth daily.         Marland Kitchen senna (SENOKOT) 8.6 MG TABS   Oral   Take 1 tablet by mouth daily.          There were no vitals taken for this visit. Physical Exam  Nursing note and vitals reviewed. Constitutional: He is oriented to person, place, and time. He appears well-developed and well-nourished. No distress.  Elderly male appears to be in no acute distress.  HENT:  Head: Normocephalic and atraumatic.  Mouth dry, lips are dry  Eyes: Conjunctivae and EOM are normal. Pupils are equal, round, and reactive to light.  Neck: Normal range of motion. Neck supple.  Cardiovascular:  Murmur (3 of 6 systolic murmur) heard. Pulmonary/Chest: Effort normal and breath sounds normal. No respiratory distress. He has no wheezes. He has no rales. He exhibits no tenderness.  Abdominal: Soft. There is no tenderness.  Musculoskeletal: He exhibits no edema and no tenderness.  Neurological: He is alert and oriented to person, place, and time.  Speech slurred, pupils equal round reactive to light, extraocular movements intact   Normal peripheral visual fields Cranial nerves III through XII normal with mild L side facial droop.  Follows commands, poor coordination to left arm with past pointing poor finger to nose. normal strength to bilateral upper and lower extremities at all major muscle groups including grip (only when pt is directed, as there are evidence of left upper extremity neglect) Sensation normal to light touch  Rapid alternating movements poor on left side L pronator drift Gait not tested due to patient feeling weak.   Skin: Skin is warm. No rash noted.  Psychiatric: He has a normal mood and  affect.    ED Course   Procedures (including critical care time)   Date: 08/23/2012  Rate: 53  Rhythm: normal sinus rhythm  QRS Axis: normal  Intervals: normal  ST/T Wave abnormalities: nonspecific T wave changes  Conduction Disutrbances:none  Narrative Interpretation:   Old EKG Reviewed: unchanged    8:43 AM Patient presents with generalized weakness. He has left side facial droop, left arm weakness, along with poor coordination. Symptoms concerning for stroke. Last normal was yesterday afternoon. Is  not a TPA candidate at this time. Stroke workup initiated. Care discussed with attending.  10:49 AM Attempt to ambulate patient with difficulty as patient is leftward leaning, endorse weakness, and unsteady on feet.  Has L arm pronator drift. Head CT without acute finding.  ECG without acute ischemic changes.  Labs are reassuring. I have consulted [facility], Dr. Starr Sinclair who agrees to admit pt to neuro floor, team 10, under her care.  She also request for neurologist to be involved. Will consult neuro.  Pt is aware of plan.    11:01 AM i have consulted neurologist, Dr. Lu Duffel who agrees to be in consultation for further evaluation of L upper extremities weakness   Labs Reviewed  PROTIME-INR - Abnormal; Notable for the following:    Prothrombin Time 16.0 (*)    All other components within normal limits  CBC - Abnormal; Notable for the following:    RBC 3.13 (*)    Hemoglobin 10.7 (*)    HCT 31.6 (*)    MCV 101.0 (*)    MCH 34.2 (*)    Platelets 83 (*)    All other components within normal limits  COMPREHENSIVE METABOLIC PANEL - Abnormal; Notable for the following:    Potassium 3.3 (*)    Total Protein 5.9 (*)    Albumin 2.8 (*)    Total Bilirubin 1.3 (*)    GFR calc non Af Amer 60 (*)    GFR calc Af Amer 69 (*)    All other components within normal limits  ETHANOL  APTT  DIFFERENTIAL  TROPONIN I  GLUCOSE, CAPILLARY  URINE RAPID DRUG SCREEN (HOSP PERFORMED)   URINALYSIS, ROUTINE W REFLEX MICROSCOPIC   Ct Head Wo Contrast  08/23/2012   *RADIOLOGY REPORT*  Clinical Data: Fatigue.  CT HEAD WITHOUT CONTRAST  Technique:  Contiguous axial images were obtained from the base of the skull through the vertex without contrast.  Comparison: 07/30/2011.  Findings: No intracranial hemorrhage.  Global atrophy without hydrocephalus.  Small vessel disease type changes without CT evidence of large acute infarct.  Vascular calcifications.  No intracranial mass lesion detected on this unenhanced exam.  Mastoid air cells, middle ear cavities and visualized paranasal sinuses are clear.  Orbital structures unremarkable.  IMPRESSION: No acute abnormality.  Please see above.   Original Report Authenticated By: Lacy Duverney, M.D.   1. Acute left-sided muscle weakness     MDM  BP 129/47  Pulse 49  Temp(Src) 98.6 F (37 C) (Oral)  Resp 16  SpO2 100%  I have reviewed nursing notes and vital signs. I personally reviewed the imaging tests through PACS system  I reviewed available ER/hospitalization records thought the EMR     Fayrene Helper, PA-C 08/23/12 1103  Fayrene Helper, PA-C 08/23/12 1105

## 2012-08-23 NOTE — ED Notes (Signed)
Pt sts he was out doing yard work yesterday felt generalized weakness more prominent on the left side. Pt has some left side facial droop and left arm and leg drift present. Pt requesting pain medication to "help me relax". Greta Doom PA made aware.

## 2012-08-23 NOTE — Consult Note (Signed)
Referring Physician: Lynelle Doctor    Chief Complaint: stroke  HPI:                                                                                                                                         Brandon Hardy is an 79 y.o. male who was out in his yard doing yard work yesterday when he noted his left arm was weaker than usual.  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  Patient was brought to ED due to son noting left arm and leg weakness along with left facial droop.  Patient does have history of GI bleed due to ulcer and esophageal bleed (secondary to ETOH) along with thrombocytopenia (current PLT 89). Patient is not a tPA candidate due to being out of window.   Date last known well: Date: 08/22/2012 Time last known well: Unable to determine tPA Given: No: unknown time onset and out of window   Past Medical History  Diagnosis Date  . Arthritis   . Blood transfusion   . Renal disorder   . Thrombocytopenia   . Heart murmur   . Chronic back pain   . Peripheral edema     was on Lasix but has been off for about 3months  . GERD (gastroesophageal reflux disease)     takes Prilosec daily  . GI bleed     hx of   . Constipation     takes Senna daily and eats Metamucil daily  . Urinary frequency   . History of kidney stones   . History of blood transfusion     no abnormal reaction noted  . Cataracts, bilateral     immature  . History of blood clots     left leg  . History of Clostridium difficile infection   . BPH (benign prostatic hyperplasia)     Past Surgical History  Procedure Laterality Date  . Fracture surgery    . Back surgery      multiple  . Neck surgery  2013    x 2/plates, screws, decreased mobility  . Left finger surgery    . Foot surgery    . Colonoscopy    . Esophagogastroduodenoscopy    . Gi bleed    . Ulnar nerve transposition  01/09/2012    Procedure: ULNAR NERVE DECOMPRESSION/TRANSPOSITION;  Surgeon:  Kathryne Hitch, MD;  Location: [facility] OR;  Service: Orthopedics;  Laterality: Right;  Right ulnar nerve decompression at the elbow/cubital tunnel and transposition  . Inguinal hernia repair Right 04/23/2012    Procedure: HERNIA REPAIR INGUINAL ADULT;  Surgeon: Kandis Cocking, MD;  Location: [facility] OR[facility];  Service: General;  Laterality: Right;  . Insertion of mesh Right 04/23/2012    Procedure: INSERTION OF MESH;  Surgeon: Kandis Cocking, MD;  Location: [facility] OR[facility];  Service: General;  Laterality: Right;    History reviewed. No pertinent family history. Social History:  reports that he quit smoking about 39 years ago. His smoking use included Cigarettes. He smoked 0.00 packs per day. He has never used smokeless tobacco. He reports that he does not drink alcohol or use illicit drugs.  Allergies:  Allergies  Allergen Reactions  . Chlorhexidine Rash    Medications:                                                                                                                           No current facility-administered medications for this encounter.   Current Outpatient Prescriptions  Medication Sig Dispense Refill  . docusate sodium (COLACE) 100 MG capsule Take 100 mg by mouth 2 (two) times daily.      Marland Kitchen gabapentin (NEURONTIN) 100 MG capsule Take 100 mg by mouth 2 (two) times daily.       Marland Kitchen omeprazole (PRILOSEC) 20 MG capsule Take 20 mg by mouth daily.      . predniSONE (DELTASONE) 10 MG tablet Take 10 mg by mouth daily.      . riboflavin (VITAMIN B-2) 100 MG TABS tablet Take 100 mg by mouth daily.         ROS:                                                                                                                                       History obtained from the patient  General ROS: negative for - chills, fatigue, fever, night sweats, weight gain or weight loss Psychological ROS: negative for - behavioral disorder, hallucinations, memory difficulties, mood swings or suicidal  ideation Ophthalmic ROS: negative for - blurry vision, double vision, eye pain or loss of vision ENT ROS: negative for - epistaxis, nasal discharge, oral lesions, sore throat, tinnitus or vertigo Allergy and Immunology ROS: negative for - hives or itchy/watery eyes Hematological and Lymphatic ROS: negative for - bleeding problems, bruising or swollen lymph nodes Endocrine ROS: negative for - galactorrhea, hair pattern changes, polydipsia/polyuria or temperature intolerance Respiratory ROS: negative for - cough, hemoptysis, shortness of breath or wheezing Cardiovascular ROS: negative for - chest pain, dyspnea on exertion, edema or irregular heartbeat Gastrointestinal ROS: negative for - abdominal pain, diarrhea, hematemesis, nausea/vomiting or stool incontinence Genito-Urinary ROS: negative for - dysuria, hematuria, incontinence or urinary frequency/urgency Musculoskeletal ROS: negative for -  joint swelling or muscular weakness Neurological ROS: as noted in HPI Dermatological ROS: negative for rash and skin lesion changes  Neurologic Examination:                                                                                                      Blood pressure 122/46, pulse 50, temperature 98.6 F (37 C), temperature source Oral, resp. rate 12, SpO2 100.00%.   Mental Status: Alert, oriented, thought content appropriate.  Speech fluent without evidence of aphasia.  Able to follow 3 step commands without difficulty. Cranial Nerves: II: Discs flat bilaterally; Visual fields grossly normal, pupils equal, round, reactive to light and accommodation III,IV, VI: ptosis not present, extra-ocular motions intact bilaterally V,VII: smile asymmetric on the left, facial light touch sensation decreased on the left VIII: hearing normal bilaterally IX,X: gag reflex present XI: bilateral shoulder shrug XII: midline tongue extension Motor: Right : Upper extremity   5/5    Left:     Upper extremity   2/5  (drift)  Lower extremity   5/5     Lower extremity   4/5 (drift) Tone and bulk:normal tone throughout; no atrophy noted Sensory: Pinprick and light touch decreased on the left arm and leg with neglect of arm and leg to DSS Deep Tendon Reflexes:  Right: Upper Extremity   Left: Upper extremity   biceps (C-5 to C-6) 2/4   biceps (C-5 to C-6) 2/4 tricep (C7) 2/4    triceps (C7) 2/4 Brachioradialis (C6) 2/4  Brachioradialis (C6) 2/4  Lower Extremity Lower Extremity  quadriceps (L-2 to L-4) 2/4   quadriceps (L-2 to L-4) 2/4 Achilles (S1) 1/4   Achilles (S1) 1/4  Plantars: Right: downgoing   Left: downgoing Cerebellar: normal finger-to-nose on right with dysmetria on the left due to weakness,  normal heel-to-shin test on the right with dysmetria on the left due to weakness CV: pulses palpable throughout    Results for orders placed during the hospital encounter of 08/23/12 (from the past 48 hour(s))  ETHANOL     Status: None   Collection Time    08/23/12  9:03 AM      Result Value Range   Alcohol, Ethyl (B) <11  0 - 11 mg/dL   Comment:            LOWEST DETECTABLE LIMIT FOR     SERUM ALCOHOL IS 11 mg/dL     FOR MEDICAL PURPOSES ONLY  PROTIME-INR     Status: Abnormal   Collection Time    08/23/12  9:03 AM      Result Value Range   Prothrombin Time 16.0 (*) 11.6 - 15.2 seconds   INR 1.31  0.00 - 1.49  APTT     Status: None   Collection Time    08/23/12  9:03 AM      Result Value Range   aPTT 32  24 - 37 seconds  CBC     Status: Abnormal   Collection Time    08/23/12  9:03 AM      Result Value Range  WBC 5.7  4.0 - 10.5 K/uL   RBC 3.13 (*) 4.22 - 5.81 MIL/uL   Hemoglobin 10.7 (*) 13.0 - 17.0 g/dL   HCT 16.1 (*) 09.6 - 04.5 %   MCV 101.0 (*) 78.0 - 100.0 fL   MCH 34.2 (*) 26.0 - 34.0 pg   MCHC 33.9  30.0 - 36.0 g/dL   RDW 40.9  81.1 - 91.4 %   Platelets 83 (*) 150 - 400 K/uL   Comment: CONSISTENT WITH PREVIOUS RESULT  DIFFERENTIAL     Status: None   Collection Time     08/23/12  9:03 AM      Result Value Range   Neutrophils Relative % 71  43 - 77 %   Neutro Abs 4.0  1.7 - 7.7 K/uL   Lymphocytes Relative 15  12 - 46 %   Lymphs Abs 0.9  0.7 - 4.0 K/uL   Monocytes Relative 11  3 - 12 %   Monocytes Absolute 0.6  0.1 - 1.0 K/uL   Eosinophils Relative 3  0 - 5 %   Eosinophils Absolute 0.2  0.0 - 0.7 K/uL   Basophils Relative 0  0 - 1 %   Basophils Absolute 0.0  0.0 - 0.1 K/uL  COMPREHENSIVE METABOLIC PANEL     Status: Abnormal   Collection Time    08/23/12  9:03 AM      Result Value Range   Sodium 144  135 - 145 mEq/L   Potassium 3.3 (*) 3.5 - 5.1 mEq/L   Chloride 110  96 - 112 mEq/L   CO2 27  19 - 32 mEq/L   Glucose, Bld 97  70 - 99 mg/dL   BUN 17  6 - 23 mg/dL   Creatinine, Ser 7.82  0.50 - 1.35 mg/dL   Calcium 8.9  8.4 - 95.6 mg/dL   Total Protein 5.9 (*) 6.0 - 8.3 g/dL   Albumin 2.8 (*) 3.5 - 5.2 g/dL   AST 26  0 - 37 U/L   ALT 13  0 - 53 U/L   Alkaline Phosphatase 67  39 - 117 U/L   Total Bilirubin 1.3 (*) 0.3 - 1.2 mg/dL   GFR calc non Af Amer 60 (*) >90 mL/min   GFR calc Af Amer 69 (*) >90 mL/min   Comment: (NOTE)     The eGFR has been calculated using the CKD EPI equation.     This calculation has not been validated in all clinical situations.     eGFR's persistently <90 mL/min signify possible Chronic Kidney     Disease.  TROPONIN I     Status: None   Collection Time    08/23/12  9:07 AM      Result Value Range   Troponin I <0.30  <0.30 ng/mL   Comment:            Due to the release kinetics of cTnI,     a negative result within the first hours     of the onset of symptoms does not rule out     myocardial infarction with certainty.     If myocardial infarction is still suspected,     repeat the test at appropriate intervals.  GLUCOSE, CAPILLARY     Status: None   Collection Time    08/23/12  9:27 AM      Result Value Range   Glucose-Capillary 85  70 - 99 mg/dL   Comment 1 Notify RN  Ct Head Wo Contrast  08/23/2012    *RADIOLOGY REPORT*  Clinical Data: Fatigue.  CT HEAD WITHOUT CONTRAST  Technique:  Contiguous axial images were obtained from the base of the skull through the vertex without contrast.  Comparison: 07/30/2011.  Findings: No intracranial hemorrhage.  Global atrophy without hydrocephalus.  Small vessel disease type changes without CT evidence of large acute infarct.  Vascular calcifications.  No intracranial mass lesion detected on this unenhanced exam.  Mastoid air cells, middle ear cavities and visualized paranasal sinuses are clear.  Orbital structures unremarkable.  IMPRESSION: No acute abnormality.  Please see above.   Original Report Authenticated By: Lacy Duverney, M.D.    08/23/2012, 11:54 AM   Assessment: 79 y.o. male with acute onset left arm and leg weakness. Although CT head is negative, patient likely suffered right brain infarct as exam shows left facial droop, left sided decreased sensation and neglect. Patient was not a tPA candidate due to being out of window and thrombocytopenia. At present time would not give ASA due to history of esophogeal bleed and current PLT CT 89K.   Stroke Risk Factors - none  Recommed: 1. HgbA1c, fasting lipid panel 2. MRI, MRA  of the brain without contrast 3. Echocardiogram 4. Carotid dopplers 5. Prophylactic therapy-None-at this time will hold off on ASA due to platelet ct 89K 6. Risk factor modification 7. Telemetry monitoring 8. Frequent neuro checks 9. PT/OT SLP  Felicie Morn PA-C [facility] [phone]   I personally participated in this patient's evaluation and management, including formulating the above clinical impression and management recommendations.  Venetia Maxon M.D. [facility] [phone]

## 2012-08-23 NOTE — Progress Notes (Signed)
Echocardiogram 2D Echocardiogram has been performed.  Brandon Hardy 08/23/2012, 3:32 PM

## 2012-08-23 NOTE — H&P (Addendum)
[facility] History and Physical  Brandon Hardy:[id number] DOB: November 14, 1927 DOA: 08/23/2012  Referring physician: Lynelle Doctor PCP: Gaye Alken, MD  Specialists:   Chief Complaint: weakness  HPI: Brandon Hardy is a 79 y.o. male with medical problems as listed below who presents with the above complaints. He states that he was out mowing his lawn yesterday and went inside to use the bathroom and noticed that he was having difficulty unbuttoning his clothes. He Also  felt weak weak overall and more so his L. Side. His symptoms were persisting today and his son noted that he had a facial droop and weakness on his left side, so he was brought to ED. Pt reports he has had numbness in his hands due to"neck problems' for which he is followed by Dr Noel Gerold. He was seen in ED head CT neg, K 3.3, neuro consulted and admission to [facility] requested. He denies dysphagia, slurred speech, headaches and no visual changes.   Review of Systems: The patient denies anorexia, fever, weight loss, vision loss, decreased hearing, hoarseness, chest pain, syncope, dyspnea on exertion, peripheral edema, balance deficits, hemoptysis, abdominal pain, melena, hematochezia, severe indigestion/heartburn, hematuria, incontinence, depression, unusual weight change, abnormal bleeding   Past Medical History  Diagnosis Date  . Arthritis   . Blood transfusion   . Renal disorder   . Thrombocytopenia   . Heart murmur   . Chronic back pain   . Peripheral edema     was on Lasix but has been off for about 3months  . GERD (gastroesophageal reflux disease)     takes Prilosec daily  . GI bleed     hx of   . Constipation     takes Senna daily and eats Metamucil daily  . Urinary frequency   . History of kidney stones   . History of blood transfusion     no abnormal reaction noted  . Cataracts, bilateral     immature  . History of blood clots     left leg  . History of Clostridium difficile infection   . BPH  (benign prostatic hyperplasia)   . Stroke 08/22/2012   Past Surgical History  Procedure Laterality Date  . Fracture surgery    . Back surgery      multiple  . Neck surgery  2013    x 2/plates, screws, decreased mobility  . Left finger surgery    . Foot surgery    . Colonoscopy    . Esophagogastroduodenoscopy    . Gi bleed    . Ulnar nerve transposition  01/09/2012    Procedure: ULNAR NERVE DECOMPRESSION/TRANSPOSITION;  Surgeon: Kathryne Hitch, MD;  Location: [facility] OR;  Service: Orthopedics;  Laterality: Right;  Right ulnar nerve decompression at the elbow/cubital tunnel and transposition  . Inguinal hernia repair Right 04/23/2012    Procedure: HERNIA REPAIR INGUINAL ADULT;  Surgeon: Kandis Cocking, MD;  Location: [facility] ORS;  Service: General;  Laterality: Right;  . Insertion of mesh Right 04/23/2012    Procedure: INSERTION OF MESH;  Surgeon: Kandis Cocking, MD;  Location: [facility] ORS;  Service: General;  Laterality: Right;   Social History:  reports that he quit smoking about 39 years ago. His smoking use included Cigarettes. He smoked 0.00 packs per day. He has never used smokeless tobacco. He reports that he does not drink alcohol or use illicit drugs.  where does patient live--home   Allergies  Allergen Reactions  . Chlorhexidine Rash    Family history-states  his brothers died on "heart failure"  Prior to Admission medications   Medication Sig Start Date End Date Taking? Authorizing Provider  docusate sodium (COLACE) 100 MG capsule Take 100 mg by mouth 2 (two) times daily.   Yes Historical Provider, MD  gabapentin (NEURONTIN) 100 MG capsule Take 100 mg by mouth 2 (two) times daily.    Yes Historical Provider, MD  omeprazole (PRILOSEC) 20 MG capsule Take 20 mg by mouth daily.   Yes Historical Provider, MD  predniSONE (DELTASONE) 10 MG tablet Take 10 mg by mouth daily.   Yes Historical Provider, MD  riboflavin (VITAMIN B-2) 100 MG TABS tablet Take 100 mg by mouth daily.   Yes  Historical Provider, MD   Physical Exam: Filed Vitals:   08/23/12 1308  BP: 134/40  Pulse: 50  Temp: 97.5 F (36.4 C)  Resp: 16    Constitutional: Vital signs reviewed.  Patient is a well-developed and well-nourished  in no acute distress and cooperative with exam. Alert and oriented x3.  Head: Normocephalic and atraumatic Nose: No erythema or drainage noted.  Mouth: no erythema or exudates, MMM Eyes: PERRL, EOMI, conjunctivae normal, No scleral icterus.  Neck: Supple, Trachea midline normal ROM, No JVD, mass, thyromegaly, or carotid bruit present.  Cardiovascular: RRR, S1 normal, S2 normal, no MRG, pulses symmetric and intact bilaterally Pulmonary/Chest: normal respiratory effort, CTAB, no wheezes, rales, or rhonchi Abdominal: Soft. Non-tender, non-distended, bowel sounds are normal, no masses, organomegaly, or guarding present.  GU: no CVA tenderness Extremities: no cyanosis and no edema  Neurological: sleepy but easily aroused & Oriented  x3, L. Facial assymetry LUE Strength 3/5, LLE strength 4/5 and R. Sided strength 5/5, cranial nerve II-XII are grossly intact,sensory decreased, and neglect of Left.  Skin: Warm, dry and intact. No rash.  Psychiatric: Normal mood and affect.  Labs on Admission:  Basic Metabolic Panel:  Recent Labs Lab 08/23/12 0903  NA 144  K 3.3*  CL 110  CO2 27  GLUCOSE 97  BUN 17  CREATININE 1.11  CALCIUM 8.9   Liver Function Tests:  Recent Labs Lab 08/23/12 0903  AST 26  ALT 13  ALKPHOS 67  BILITOT 1.3*  PROT 5.9*  ALBUMIN 2.8*   No results found for this basename: LIPASE, AMYLASE,  in the last 168 hours No results found for this basename: AMMONIA,  in the last 168 hours CBC:  Recent Labs Lab 08/23/12 0903  WBC 5.7  NEUTROABS 4.0  HGB 10.7*  HCT 31.6*  MCV 101.0*  PLT 83*   Cardiac Enzymes:  Recent Labs Lab 08/23/12 0907  TROPONINI <0.30    BNP (last 3 results) No results found for this basename: PROBNP,  in the  last 8760 hours CBG:  Recent Labs Lab 08/23/12 0927  GLUCAP 85    Radiological Exams on Admission: Ct Head Wo Contrast  08/23/2012   *RADIOLOGY REPORT*  Clinical Data: Fatigue.  CT HEAD WITHOUT CONTRAST  Technique:  Contiguous axial images were obtained from the base of the skull through the vertex without contrast.  Comparison: 07/30/2011.  Findings: No intracranial hemorrhage.  Global atrophy without hydrocephalus.  Small vessel disease type changes without CT evidence of large acute infarct.  Vascular calcifications.  No intracranial mass lesion detected on this unenhanced exam.  Mastoid air cells, middle ear cavities and visualized paranasal sinuses are clear.  Orbital structures unremarkable.  IMPRESSION: No acute abnormality.  Please see above.   Original Report Authenticated By: Lacy Duverney, M.D.  Assessment/Plan Active Problems:   Acute left-sided muscle weakness -As discussed above, will admit ot eval for CVA -CT with no acute abnl -obtain MRI/MRA/carotid doppler/echo/lipid panel, A1C. - neuro recommending to hold off on ASA for now due to platelet ct 83K -PT/OT/SLP cons   GERD (gastroesophageal reflux disease) -PPI -place on PPI   Hypokalemia -replace K Thrombocytopenia, chronic -no gross bleeding -avoid lovenox, and as above neuro recommending to hold off on for now ASA due to platelet ct 83K H/O Chronic back pain -followed by Dr Noel Gerold -increase his prednisone to 20mg  , follow and decrease back to his outpt dose of 10mg  upon d/c Remote H/O alcohol abuse States he quit alcohol 69yrs ago H/O GI bleed -follow recheck hgb  Code Status: full Family Communication: none at bedside Disposition Plan: admit to tele  Time spent: >30  Kela Millin [facility] Pager [phone]  If 7PM-7AM, please contact night-coverage [email or web address] Password [facility] 08/23/2012, 2:03 PM

## 2012-08-23 NOTE — ED Notes (Signed)
Pt. Is aware of needing a urine specimen 

## 2012-08-23 NOTE — ED Notes (Signed)
Attempted to get pt. oob to ambulate. Pt. Was leaning to the lt. Side.  Fayrene Helper, PA assisted.

## 2012-08-23 NOTE — Progress Notes (Signed)
*  PRELIMINARY RESULTS* Vascular Ultrasound Carotid Doppler has been completed.  Preliminary findings: 1-39% ICA stenosis bilaterally.    Farrel Demark, RDMS, RVT  08/23/2012, 4:09 PM

## 2012-08-23 NOTE — ED Notes (Signed)
Notified RN of CBG 85

## 2012-08-23 NOTE — Progress Notes (Signed)
Utilization Review Completed.Brandon Hardy T8/22/2014  

## 2012-08-23 NOTE — ED Notes (Signed)
Neurology at bedside.

## 2012-08-24 LAB — BASIC METABOLIC PANEL
CO2: 25 mEq/L (ref 19–32)
GFR calc non Af Amer: 77 mL/min — ABNORMAL LOW (ref >90)
Glucose, Bld: 85 mg/dL (ref 70–99)
Potassium: 5.1 mEq/L (ref 3.5–5.1)
Sodium: 140 mEq/L (ref 135–145)

## 2012-08-24 LAB — LIPID PANEL
Cholesterol: 116 mg/dL (ref 0–200)
HDL: 56 mg/dL (ref >39)
Total CHOL/HDL Ratio: 2.1 RATIO
VLDL: 9 mg/dL (ref 0–40)

## 2012-08-24 LAB — HEMOGLOBIN A1C: Mean Plasma Glucose: 85 mg/dL (ref <117)

## 2012-08-24 LAB — CBC
Hemoglobin: 10.2 g/dL — ABNORMAL LOW (ref 13.0–17.0)
MCH: 33.8 pg (ref 26.0–34.0)
Platelets: 81 10*3/uL — ABNORMAL LOW (ref 150–400)
RBC: 3.02 MIL/uL — ABNORMAL LOW (ref 4.22–5.81)
WBC: 5.5 10*3/uL (ref 4.0–10.5)

## 2012-08-24 MED ORDER — ASPIRIN EC 81 MG PO TBEC
81.0000 mg | DELAYED_RELEASE_TABLET | Freq: Every day | ORAL | Status: DC
  Administered 2012-08-24 – 2012-08-28 (×5): 81 mg via ORAL
  Filled 2012-08-24 (×5): qty 1

## 2012-08-24 MED ORDER — ACETAMINOPHEN-CODEINE #3 300-30 MG PO TABS
1.0000 | ORAL_TABLET | Freq: Four times a day (QID) | ORAL | Status: DC | PRN
  Administered 2012-08-24 – 2012-08-27 (×6): 1 via ORAL
  Filled 2012-08-24 (×6): qty 1

## 2012-08-24 MED ORDER — MAGNESIUM HYDROXIDE 400 MG/5ML PO SUSP
30.0000 mL | Freq: Every day | ORAL | Status: DC | PRN
  Administered 2012-08-24: 30 mL via ORAL
  Filled 2012-08-24: qty 30

## 2012-08-24 NOTE — Progress Notes (Signed)
Stroke Team Progress Note  HISTORY  Brandon Hardy is an 79 y.o. male who was out in his yard doing yard work on Thursday 08/22/2012 when he noted his left arm was weaker than usual. When he was on his riding lawn mower he was unable to engage the blades with his left hand. Patient was brought to ED due to son noting left arm and leg weakness along with left facial droop. Patient does have history of GI bleed due to ulcer and esophageal bleed (secondary to ETOH) along with thrombocytopenia (PLT 89 on admission). Patient was not a tPA candidate due to being out of window.   Date last known well: Date: 08/22/2012  Time last known well: Unable to determine  tPA Given: No: unknown time onset and out of window   SUBJECTIVE The patient's son is present this morning. The patient feels that he is significantly improved. He is anxious for discharge. We discussed possible antiplatelet therapy as secondary stroke prevention. Patient reports constipation.  OBJECTIVE Most recent Vital Signs: Filed Vitals:   08/23/12 2300 08/24/12 0100 08/24/12 0500 08/24/12 0546  BP: 118/51 136/45 130/48 112/28  Pulse: 50 50 48 51  Temp: 98.2 F (36.8 C) 98.6 F (37 C) 98.7 F (37.1 C) 97.9 F (36.6 C)  TempSrc: Oral Oral Oral Oral  Resp: 20 18 20 20   Weight:      SpO2: 97% 100% 99% 100%   CBG (last 3)   Recent Labs  08/23/12 0927  GLUCAP 85    IV Fluid Intake:   . sodium chloride 75 mL/hr at 08/24/12 0644    MEDICATIONS  . docusate sodium  100 mg Oral BID  . gabapentin  100 mg Oral BID  . pantoprazole  40 mg Oral Daily  . predniSONE  20 mg Oral Q breakfast   PRN:  acetaminophen, acetaminophen, alum & mag hydroxide-simeth, magnesium hydroxide, ondansetron (ZOFRAN) IV, senna-docusate  Diet:  Cardiac thin liquids Activity:  Up with assistance DVT Prophylaxis:  SCDs  CLINICALLY SIGNIFICANT STUDIES Basic Metabolic Panel:   Recent Labs Lab 08/23/12 0903 08/24/12 0430  NA 144 140  K 3.3* 5.1   CL 110 110  CO2 27 25  GLUCOSE 97 85  BUN 17 18  CREATININE 1.11 0.91  CALCIUM 8.9 8.6   Liver Function Tests:   Recent Labs Lab 08/23/12 0903  AST 26  ALT 13  ALKPHOS 67  BILITOT 1.3*  PROT 5.9*  ALBUMIN 2.8*   CBC:   Recent Labs Lab 08/23/12 0903 08/24/12 0430  WBC 5.7 5.5  NEUTROABS 4.0  --   HGB 10.7* 10.2*  HCT 31.6* 30.8*  MCV 101.0* 102.0*  PLT 83* 81*   Coagulation:   Recent Labs Lab 08/23/12 0903  LABPROT 16.0*  INR 1.31   Cardiac Enzymes:   Recent Labs Lab 08/23/12 0907  TROPONINI <0.30   Urinalysis:   Recent Labs Lab 08/23/12 1410  COLORURINE YELLOW  LABSPEC 1.014  PHURINE 7.5  GLUCOSEU NEGATIVE  HGBUR NEGATIVE  BILIRUBINUR NEGATIVE  KETONESUR NEGATIVE  PROTEINUR NEGATIVE  UROBILINOGEN 1.0  NITRITE NEGATIVE  LEUKOCYTESUR NEGATIVE   Lipid Panel    Component Value Date/Time   CHOL 116 08/24/2012 0430   TRIG 46 08/24/2012 0430   HDL 56 08/24/2012 0430   CHOLHDL 2.1 08/24/2012 0430   VLDL 9 08/24/2012 0430   LDLCALC 51 08/24/2012 0430   HgbA1C  No results found for this basename: HGBA1C    Urine Drug Screen:  Component Value Date/Time   LABOPIA POSITIVE* 08/23/2012 1410   COCAINSCRNUR NONE DETECTED 08/23/2012 1410   LABBENZ NONE DETECTED 08/23/2012 1410   AMPHETMU NONE DETECTED 08/23/2012 1410   THCU NONE DETECTED 08/23/2012 1410   LABBARB NONE DETECTED 08/23/2012 1410    Alcohol Level:   Recent Labs Lab 08/23/12 0903  ETH <11    Dg Chest 2 View 08/23/2012   No acute cardiopulmonary abnormality seen.    Ct Head Wo Contrast 08/23/2012 No acute abnormality.    Mr Brain Wo Contrast 08/23/2012 Acute right MCA territory infarction. This involves the right posterior frontal cortex, inferior frontal operculum, and right insular ribbon.  The distribution is consistent with the patient's history of left hemiparesis.  Atrophy and small vessel disease.    MRA HEAD   No proximal intracranial stenosis or aneurysm.  Severe  narrowing of a right MCA M3 branch likely accounts for the observed intracranial infarction.    2D Echocardiogram  ejection fraction 55%. No cardiac source of embolism was identified.  Carotid Doppler  Preliminary findings: 1-39% ICA stenosis bilaterally.  CXR  No acute cardiopulmonary abnormality seen.  EKG  sinus rhythm rate 53 beats per minute  Therapy Recommendations - pending  Physical Exam    Neurologic Examination:  Blood pressure 122/46, pulse 50, temperature 98.6 F (37 C), temperature source Oral, resp. rate 12, SpO2 100.00%.  Mental Status:  Alert, oriented, thought content appropriate. Speech fluent without evidence of aphasia. Able to follow 3 step commands without difficulty.  Cranial Nerves:  II: Discs flat bilaterally; Visual fields grossly normal, pupils equal, round, reactive to light and accommodation  III,IV, VI: ptosis not present, extra-ocular motions intact bilaterally  V,VII: smile asymmetric on the left, facial light touch sensation decreased on the left  VIII: hearing normal bilaterally  IX,X: gag reflex present  XI: bilateral shoulder shrug  XII: midline tongue extension  Motor:  Right : Upper extremity 5/5 Left: Upper extremity 4/5 (drift)  Lower extremity 5/5 Lower extremity 4/5 (drift)  Tone and bulk:normal tone throughout; no atrophy noted  Sensory: Pinprick and light touch decreased on the left arm and leg with neglect of arm and leg to DSS  Deep Tendon Reflexes:  Right: Upper Extremity Left: Upper extremity  biceps (C-5 to C-6) 2/4 biceps (C-5 to C-6) 2/4  tricep (C7) 2/4 triceps (C7) 2/4  Brachioradialis (C6) 2/4 Brachioradialis (C6) 2/4  Lower Extremity Lower Extremity  quadriceps (L-2 to L-4) 2/4 quadriceps (L-2 to L-4) 2/4  Achilles (S1) 1/4 Achilles (S1) 1/4  Plantars:  Right: downgoing Left: downgoing  Cerebellar:  normal finger-to-nose on right with dysmetria on the left , normal heel-to-shin test on the right with dysmetria on the  left due to weakness  CV: pulses palpable throughout    ASSESSMENT [name] is a 79 y.o. male presenting with left hemiparesis and left facial droop. TPA was not given as the patient was outside the therapeutic window.  Imaging confirms a right middle cerebral artery territory  Branch infarct. Infarct felt to be embolic secondary to unknown etiology..  On no antithrombotics prior to admission. Now on no antithrombotics for secondary stroke prevention due to thrombocytopenia.  Patient with resultant left hemiparesis. Work up underway.   Anemia  Thrombocytopenia  Urine drug screen positive for opiates  EtOH history  History of GI bleed  Prednisone therapy for arthritis.  Hospital day # 1  TREATMENT/PLAN  Start aspirin 81 mg daily for secondary stroke prevention.  Monitor thrombocytopenia /  anemia.  Await therapist's evaluations  Await hemoglobin A1c  Will need TEE - try for Monday  Delton See PA-C [facility] Pager [phone] 08/24/2012, 8:56 AM  I have personally obtained a history, examined the patient, evaluated imaging results, and formulated the assessment and plan of care. I agree with the above. Delia Heady, MD

## 2012-08-24 NOTE — Evaluation (Signed)
Physical Therapy Evaluation Patient Details Name: Brandon Hardy MRN: [id number] DOB: Aug 09, 1927 Today's Date: 08/24/2012 Time: 6295-2841 PT Time Calculation (min): 40 min  PT Assessment / Plan / Recommendation History of Present Illness   Brandon Hardy is a 79 y.o. male with medical problems as listed below who presents with the above complaints. He states that he was out mowing his lawn yesterday and went inside to use the bathroom and noticed that he was having difficulty unbuttoning his clothes. He Also  felt weak weak overall and more so his L. Side. His symptoms were persisting today and his son noted that he had a facial droop and weakness on his left side, so he was brought to ED. Pt reports he has had numbness in his hands due to"neck problems' for which he is followed by Dr Noel Gerold.  Clinical Impression  Pt admitted with L sided weakness--MRI shows R MCA CVA.  Pt currently with functional limitations due to the deficits listed below (see PT Problem List).  Pt will benefit from skilled PT to increase their independence and safety with mobility to allow discharge to the venue listed below.       PT Assessment  Patient needs continued PT services    Follow Up Recommendations  CIR;Supervision for mobility/OOB    Does the patient have the potential to tolerate intense rehabilitation      Barriers to Discharge        Equipment Recommendations  None recommended by PT    Recommendations for Other Services Rehab consult   Frequency Min 3X/week    Precautions / Restrictions Precautions Precautions: Fall   Pertinent Vitals/Pain       Mobility  Bed Mobility Bed Mobility: Supine to Sit;Sitting - Scoot to Edge of Bed Supine to Sit: 4: Min assist;HOB flat Sitting - Scoot to [place] of Bed: 4: Min guard Details for Bed Mobility Assistance: pt with tendency to list posteriorly, L UE used clumsily Transfers Transfers: Sit to Stand;Stand to Sit Sit to Stand: 4: Min assist;With  upper extremity assist;From bed Stand to Sit: 4: Min assist;To chair/3-in-1 Details for Transfer Assistance: vc's for hand placement, steady assist; used legs against bed for stabiliy Ambulation/Gait Ambulation/Gait Assistance: 4: Min assist Ambulation Distance (Feet): 70 Feet Assistive device: Rolling walker Ambulation/Gait Assistance Details: mildly unsteady thoughout, mildly HP with flat foot initial contact.  needed assist managing RW. Gait Pattern: Step-through pattern Stairs: No Wheelchair Mobility Wheelchair Mobility: No Modified Rankin (Stroke Patients Only) Pre-Morbid Rankin Score: No symptoms Modified Rankin: Moderately severe disability    Exercises     PT Diagnosis: Abnormality of gait;Acute pain (hemiparesis L side)  PT Problem List: Decreased strength;Decreased activity tolerance;Decreased balance;Decreased mobility;Decreased coordination;Decreased knowledge of use of DME;Decreased safety awareness PT Treatment Interventions: DME instruction;Gait training;Functional mobility training;Therapeutic activities;Balance training;Patient/family education     PT Goals(Current goals can be found in the care plan section) Acute Rehab PT Goals Patient Stated Goal: I want to go home as soon as I can, my wife and I need each other PT Goal Formulation: With patient Time For Goal Achievement: 09/07/12 Potential to Achieve Goals: Good  Visit Information  Last PT Received On: 08/24/12 Assistance Needed: +1 History of Present Illness:  Brandon Hardy is a 79 y.o. male with medical problems as listed below who presents with the above complaints. He states that he was out mowing his lawn yesterday and went inside to use the bathroom and noticed that he was having difficulty unbuttoning his clothes.  He Also  felt weak weak overall and more so his L. Side. His symptoms were persisting today and his son noted that he had a facial droop and weakness on his left side, so he was brought to ED.  Pt reports he has had numbness in his hands due to"neck problems' for which he is followed by Dr Noel Gerold.       Prior Functioning  Home Living Family/patient expects to be discharged to:: Private residence Living Arrangements: Spouse/significant other Available Help at Discharge: Family;Available PRN/intermittently (wife cannot provide heavy physical assist) Type of Home: House Home Access: Stairs to enter [employer] of Steps: 4; 2 in garage Entrance Stairs-Rails: Left;Right;Can reach both (no rails in garage entrance) Home Layout: One level Home Equipment: Walker - 2 wheels;Cane - single point;Grab bars - toilet;Tub bench;Shower seat;Hand held shower head Additional Comments: Wife is staying with son while husband is at hospital, she has had h/o CVA. Pt reports he does not want to depend on his wife. Prior Function Level of Independence: Independent with assistive device(s) Gait / Transfers Assistance Needed: Occasionally uses RW. Mostly uses cane. ADL's / Homemaking Assistance Needed: Reports he occasionally needs to assist wife and she sometimes gives him light assistance.  Communication Communication: 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 Dominant Hand: Right    Cognition  Cognition Arousal/Alertness: Awake/alert Behavior During Therapy: WFL for tasks assessed/performed Overall Cognitive Status: Impaired/Different from baseline Area of Impairment: Following commands;Problem solving;Safety/judgement Following Commands: Follows one step commands consistently;Follows multi-step commands inconsistently Safety/Judgement: Decreased awareness of deficits;Decreased awareness of safety Problem Solving: Slow processing;Requires verbal cues    Extremity/Trunk Assessment Lower Extremity Assessment Lower Extremity Assessment: LLE deficits/detail LLE Deficits / Details: noticeably weaker than R though at least >3/5   Balance Balance Balance Assessed: Yes Static Sitting Balance Static Sitting -  Balance Support: Feet supported;Left upper extremity supported;Right upper extremity supported Static Sitting - Level of Assistance: 5: Stand by assistance Dynamic Sitting Balance Dynamic Sitting - Balance Support: Feet supported;Left upper extremity supported;Right upper extremity supported Dynamic Sitting - Level of Assistance: 4: Min assist;Other (comment) (MMT challenged balance to point where pt listed posteriorly)  End of Session PT - End of Session Activity Tolerance: Patient tolerated treatment well Patient left: in chair;with call bell/phone within reach Nurse Communication: Mobility status  GP     Ellen Goris, [name] 08/24/2012, 12:47 PM 08/24/2012  [place], PT [phone]  (pager)

## 2012-08-24 NOTE — Evaluation (Signed)
Speech Language Pathology Evaluation Patient Details Name: DECOREY WAHLERT MRN: [id number] DOB: 21-May-1927 Today's Date: 08/24/2012 Time: 7829-5621 SLP Time Calculation (min): 27 min  Problem List:  Patient Active Problem List   Diagnosis Date Noted   Acute left-sided muscle weakness 08/23/2012   Hypokalemia 08/23/2012   CVA (cerebral infarction) 08/23/2012   Cubital tunnel syndrome on right 01/09/2012   Right groin pain 09/19/2011   Gait disturbance 07/30/2011   Fall 07/30/2011   Humerus fracture 07/30/2011   GERD (gastroesophageal reflux disease) 07/30/2011   BPH (benign prostatic hyperplasia) 07/30/2011   Leg edema 07/30/2011   Past Medical History:  Past Medical History  Diagnosis Date   Arthritis    Blood transfusion    Renal disorder    Thrombocytopenia    Heart murmur    Chronic back pain    Peripheral edema     was on Lasix but has been off for about 3months   GERD (gastroesophageal reflux disease)     takes Prilosec daily   GI bleed     hx of    Constipation     takes Senna daily and eats Metamucil daily   Urinary frequency    History of kidney stones    History of blood transfusion     no abnormal reaction noted   Cataracts, bilateral     immature   History of blood clots     left leg   History of Clostridium difficile infection    BPH (benign prostatic hyperplasia)    Stroke 08/22/2012   Past Surgical History:  Past Surgical History  Procedure Laterality Date   Fracture surgery     Back surgery      multiple   Neck surgery  2013    x 2/plates, screws, decreased mobility   Left finger surgery     Foot surgery     Colonoscopy     Esophagogastroduodenoscopy     Gi bleed     Ulnar nerve transposition  01/09/2012    Procedure: ULNAR NERVE DECOMPRESSION/TRANSPOSITION;  Surgeon: Kathryne Hitch, MD;  Location: [facility] OR;  Service: Orthopedics;  Laterality: Right;  Right ulnar nerve decompression at the  elbow/cubital tunnel and transposition   Inguinal hernia repair Right 04/23/2012    Procedure: HERNIA REPAIR INGUINAL ADULT;  Surgeon: Kandis Cocking, MD;  Location: [facility] ORS;  Service: General;  Laterality: Right;   Insertion of mesh Right 04/23/2012    Procedure: INSERTION OF MESH;  Surgeon: Kandis Cocking, MD;  Location: [facility] ORS;  Service: General;  Laterality: Right;   HPI:  Pt is a 79 y.o. male admitted to ER with left sided weakness and facial drrop on left side. Pt reports he has had numbness in his hands due to"neck problems' for which he is followed by Dr Noel Gerold. He was seen in ED head CT neg, K 3.3, neuro consulted and admission to [facility] requested. He denies dysphagia, slurred speech, headaches and no visual changes. Pt with PMH of arthritis, renal disorder, chronic back pain, heart murmer, GERD, GI bleed, hx of blood clots, BPH and past surgerical hx of back surgery, neck surgery (2 plates), esophagogastroduodensocopy. Pt with dx of right MCA CVA involving right posterior frontal cortex, inferior frontal operculum, and righ insular ribbon per MRI report.    Assessment / Plan / Recommendation Clinical Impression  Pt adm with right MCA CVA with left hemiparesis presents with cognitive function and language abilities WFL. Given 3 step commands, pt followed directions [facility]  and presented with function verbal and receptive language abilities. Directed pt to verbally demonstrate problem solving and satety reasoning in functional tasks, which pt successfully expressed appropriate judgment and reasoning on 3 opportunities. Pt with mild facial droop on left side, however speech is intelligible at conversational level. Pt judged to not require further ST services for cognitive function or language abilities. ST signing off.     SLP Assessment  Patient does not need any further Speech Lanaguage Pathology Services    Follow Up Recommendations  Inpatient Rehab    Frequency and Duration        Pertinent  Vitals/Pain Prior to SLE, pt received pain medication    SLP Evaluation Prior Functioning  Cognitive/Linguistic Baseline: Within functional limits Type of Home: House  Lives With: Spouse Available Help at Discharge: Family;Available PRN/intermittently Vocation: Retired   IT consultant  Overall Cognitive Status: Within Functional Limits for tasks assessed Arousal/Alertness: Awake/alert Orientation Level: Oriented X4 Attention: Selective Selective Attention: Appears intact Memory: Appears intact Awareness: Appears intact Problem Solving: Appears intact Executive Function: Reasoning;Sequencing Reasoning: Appears intact Sequencing: Appears intact Safety/Judgment: Appears intact    Comprehension  Auditory Comprehension Overall Auditory Comprehension: Appears within functional limits for tasks assessed Yes/No Questions: Within Functional Limits Commands: Within Functional Limits Conversation: Complex Interfering Components: Pain Visual Recognition/Discrimination Discrimination: Within Function Limits Reading Comprehension Reading Status: Within funtional limits    Expression Expression Primary Mode of Expression: Verbal Verbal Expression Overall Verbal Expression: Appears within functional limits for tasks assessed Initiation: No impairment Automatic Speech: Name;Social Response;Counting Level of Generative/Spontaneous Verbalization: Conversation Repetition: No impairment Naming: No impairment Pragmatics: No impairment Written Expression Dominant Hand: Right Written Expression: Not tested   Oral / Motor Oral Motor/Sensory Function Overall Oral Motor/Sensory Function: Impaired Labial ROM: Reduced left Labial Symmetry: Abnormal symmetry left Labial Strength: Within Functional Limits Facial ROM: Reduced left Facial Symmetry: Left droop Motor Speech Overall Motor Speech: Appears within functional limits for tasks assessed Intelligibility: Intelligible   GO     Lyanne Co MA CCC-SLP 08/24/2012, 3:57 PM

## 2012-08-24 NOTE — Evaluation (Signed)
Occupational Therapy Evaluation Patient Details Name: Brandon Hardy MRN: [id number] DOB: 09/13/1927 Today's Date: 08/24/2012 Time: 4098-1191 OT Time Calculation (min): 36 min  OT Assessment / Plan / Recommendation History of present illness  Brandon Hardy is a 79 y.o. male with medical problems as listed below who presents with the above complaints. He states that he was out mowing his lawn yesterday and went inside to use the bathroom and noticed that he was having difficulty unbuttoning his clothes. He Also  felt weak overall and more so his L. Side. His symptoms were persisting today and his son noted that he had a facial droop and weakness on his left side, so he was brought to ED. Pt reports he has had numbness in his hands due to"neck problems' for which he is followed by Dr Noel Gerold.   Clinical Impression   Pt admitted with above.  Pt continues to demonstrate left side weakness and decreased awareness of left side. Will continue to follow acutely in order to address below problem list.  Recommending CIR to further progress rehab and maximize safety and independence with ADLs prior to return home.    OT Assessment  Patient needs continued OT Services    Follow Up Recommendations  CIR    Barriers to Discharge Decreased caregiver support wife cannot provide heavy assist  Equipment Recommendations       Recommendations for Other Services    Frequency  Min 3X/week    Precautions / Restrictions Precautions Precautions: Fall   Pertinent Vitals/Pain See vitals    ADL  Eating/Feeding: Performed;Modified independent Where Assessed - Eating/Feeding: Chair Upper Body Bathing: Minimal assistance;Simulated Where Assessed - Upper Body Bathing: Unsupported sitting Lower Body Bathing: Simulated;Moderate assistance Where Assessed - Lower Body Bathing: Supported sit to stand Upper Body Dressing: Performed;Minimal assistance Where Assessed - Upper Body Dressing: Unsupported sitting Lower  Body Dressing: Performed;Maximal assistance Where Assessed - Lower Body Dressing: Supported sit to stand Toilet Transfer: Simulated;Minimal assistance Toilet Transfer Method: Sit to stand (ambulating) Acupuncturist:  (chair) Equipment Used: Gait belt;Rolling walker Transfers/Ambulation Related to ADLs: Min assist with RW. Assist for steadying and for safe use of RW. Pt with tendency to veer left while ambulating. Frequent assist to repositiong left hand on RW (decreased proprioception and grip strength). ADL Comments: Decreased awareness of Left side during all ADL and mobility tasks.    OT Diagnosis: Generalized weakness;Paresis  OT Problem List: Decreased strength;Decreased activity tolerance;Impaired balance (sitting and/or standing);Impaired vision/perception;Decreased coordination;Decreased safety awareness;Decreased knowledge of use of DME or AE;Impaired UE functional use;Impaired sensation OT Treatment Interventions: Self-care/ADL training;Neuromuscular education;DME and/or AE instruction;Therapeutic activities;Cognitive remediation/compensation;Patient/family education;Balance training   OT Goals(Current goals can be found in the care plan section) Acute Rehab OT Goals Patient Stated Goal: I want to go home as soon as I can, my wife and I need each other OT Goal Formulation: With patient Time For Goal Achievement: 09/07/12 Potential to Achieve Goals: Good  Visit Information  Last OT Received On: 08/24/12 Assistance Needed: +1 PT/OT Co-Evaluation/Treatment: Yes History of Present Illness:  Brandon Hardy is a 79 y.o. male with medical problems as listed below who presents with the above complaints. He states that he was out mowing his lawn yesterday and went inside to use the bathroom and noticed that he was having difficulty unbuttoning his clothes. He Also  felt weak weak overall and more so his L. Side. His symptoms were persisting today and his son noted that he had a  facial  droop and weakness on his left side, so he was brought to ED. Pt reports he has had numbness in his hands due to"neck problems' for which he is followed by Dr Noel Gerold.       Prior Functioning     Home Living Family/patient expects to be discharged to:: Private residence Living Arrangements: Spouse/significant other Available Help at Discharge: Family;Available PRN/intermittently (wife cannot provide heavy physical assist) Type of Home: House Home Access: Stairs to enter [employer] of Steps: 4; 2 in garage Entrance Stairs-Rails: Left;Right;Can reach both (no rails in garage entrance) Home Layout: One level Home Equipment: Walker - 2 wheels;Cane - single point;Grab bars - toilet;Tub bench;Shower seat;Hand held shower head Additional Comments: Wife is staying with son while husband is at hospital, she has had h/o CVA. Pt reports he does not want to depend on his wife. Prior Function Level of Independence: Independent with assistive device(s) Gait / Transfers Assistance Needed: Occasionally uses RW. Mostly uses cane. ADL's / Homemaking Assistance Needed: Reports he occasionally needs to assist wife and she sometimes gives him light assistance.  Communication Communication: 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 Dominant Hand: Right         Vision/Perception Perception Perception: Impaired Inattention/Neglect: Does not attend to left side of body (due to proprioceptive deficits)   Cognition  Cognition Arousal/Alertness: Awake/alert Behavior During Therapy: WFL for tasks assessed/performed Overall Cognitive Status: Impaired/Different from baseline Area of Impairment: Following commands;Problem solving;Safety/judgement Following Commands: Follows one step commands consistently;Follows multi-step commands inconsistently Safety/Judgement: Decreased awareness of deficits;Decreased awareness of safety Problem Solving: Slow processing;Requires verbal cues    Extremity/Trunk Assessment  Upper Extremity Assessment Upper Extremity Assessment: LUE deficits/detail LUE Deficits / Details: 3+/5 - 4/5 throughout LUE Sensation: decreased light touch;decreased proprioception LUE Coordination: decreased fine motor;decreased gross motor Lower Extremity Assessment Lower Extremity Assessment: LLE deficits/detail LLE Deficits / Details: noticeably weaker than R though at least >3/5     Mobility Bed Mobility Bed Mobility: Supine to Sit;Sitting - Scoot to Edge of Bed Supine to Sit: 4: Min assist;HOB flat Sitting - Scoot to [employer] of Bed: 4: Min guard Details for Bed Mobility Assistance: pt with tendency to list posteriorly, L UE used clumsily Transfers Transfers: Sit to Stand;Stand to Sit Sit to Stand: 4: Min assist;With upper extremity assist;From bed Stand to Sit: 4: Min assist;To chair/3-in-1 Details for Transfer Assistance: vc's for hand placement, steady assist; used legs against bed for stabiliy     Exercise     Balance Balance Balance Assessed: Yes Static Sitting Balance Static Sitting - Balance Support: Feet supported;Left upper extremity supported;Right upper extremity supported Static Sitting - Level of Assistance: 5: Stand by assistance Dynamic Sitting Balance Dynamic Sitting - Balance Support: Feet supported;Left upper extremity supported;Right upper extremity supported Dynamic Sitting - Level of Assistance: 4: Min assist   End of Session OT - End of Session Equipment Utilized During Treatment: Gait belt;Rolling walker Activity Tolerance: Patient tolerated treatment well Patient left: in chair;with call bell/phone within reach;with chair alarm set Nurse Communication: Mobility status  GO    08/24/2012 Cipriano Mile OTR/L Pager [phone] Office [phone]  Cipriano Mile 08/24/2012, 1:53 PM

## 2012-08-24 NOTE — Progress Notes (Addendum)
[facility] PROGRESS NOTE  Brandon Hardy:[id number] DOB: Mar 05, 1927 DOA: 08/23/2012 PCP: Gaye Alken, MD  Assessment/Plan: Acute left-sided muscle weakness/Acute R.MCA infarct -CT with no acute abnl  -obtain MRI with R.MCA infarct  - neuro recommending to start ASA today 8/23 -PT/OT>>rehab  GERD (gastroesophageal reflux disease)  -place on PPI  Hypokalemia  -resolved  Thrombocytopenia, chronic  -no gross bleeding  -avoiding lovenox, plt 81 today.follow on asa H/O Chronic back pain  -followed by Dr Noel Gerold  -increase his prednisone to 20mg  , follow and decrease back to his outpt dose of 10mg  upon d/c  -resume tylenol #3 Remote H/O alcohol abuse  States he quit alcohol 71yrs ago  H/O GI bleed  -follow recheck hgb  Constipation monm as requested, follow    Code Status: full Family Communication: son at bedside  Disposition Plan: will need rehab   Consultants:  neuro  Procedures:  Echo Study Conclusions  - Left ventricle: The cavity size was normal. Wall thickness was increased in a pattern of mild LVH. The estimated ejection fraction was 55%. Wall motion was normal; there were no regional wall motion abnormalities. - Aortic valve: Mild aortic stenosis. Mean gradient: 14mm Hg (S). Peak gradient: 26mm Hg (S). - Mitral valve: Mild regurgitation. - Left atrium: The atrium was mildly dilated. - Right ventricle: The cavity size was mildly dilated. Systolic function was mildly reduced. - Right atrium: The atrium was mildly dilated. - Impressions: No cardiac source of embolism was identified, but cannot be ruled out on the basis of this examination.     Antibiotics:  none  HPI/Subjective: C/o constipation and requesting MOM, also requesting tylenol #3 which he states he takes at home   Objective: Filed Vitals:   08/24/12 0546  BP: 112/28  Pulse: 51  Temp: 97.9 F (36.6 C)  Resp: 20    Intake/Output Summary (Last 24 hours) at  08/24/12 1003 Last data filed at 08/24/12 0646  Gross per 24 hour  Intake 1093.75 ml  Output   1100 ml  Net  -6.25 ml   Filed Weights   08/23/12 1308  Weight: 61.281 kg (135 lb 1.6 oz)      Exam:  General: alert & oriented x 3 In NAD Cardiovascular: RRR, nl S1 s2 Respiratory: CTAB Abdomen: soft +BS NT/ND, no masses palpable Extremities: No cyanosis and no edema             Neuro: Facial assymetry LUE Strength 4/5, LLE strength 4/5 and R. Sided strength 5/5     Data Reviewed: Basic Metabolic Panel:  Recent Labs Lab 08/23/12 0903 08/24/12 0430  NA 144 140  K 3.3* 5.1  CL 110 110  CO2 27 25  GLUCOSE 97 85  BUN 17 18  CREATININE 1.11 0.91  CALCIUM 8.9 8.6   Liver Function Tests:  Recent Labs Lab 08/23/12 0903  AST 26  ALT 13  ALKPHOS 67  BILITOT 1.3*  PROT 5.9*  ALBUMIN 2.8*   No results found for this basename: LIPASE, AMYLASE,  in the last 168 hours No results found for this basename: AMMONIA,  in the last 168 hours CBC:  Recent Labs Lab 08/23/12 0903 08/24/12 0430  WBC 5.7 5.5  NEUTROABS 4.0  --   HGB 10.7* 10.2*  HCT 31.6* 30.8*  MCV 101.0* 102.0*  PLT 83* 81*   Cardiac Enzymes:  Recent Labs Lab 08/23/12 0907  TROPONINI <0.30   BNP (last 3 results) No results found for this basename: PROBNP,  in  the last 8760 hours CBG:  Recent Labs Lab 08/23/12 0927  GLUCAP 85    No results found for this or any previous visit (from the past 240 hour(s)).   Studies: Dg Chest 2 View  08/23/2012   *RADIOLOGY REPORT*  Clinical Data: Cerebrovascular accident.  CHEST - 2 VIEW  Comparison: July 30, 2011.  Findings: Stable mild cardiomegaly.  No acute pulmonary disease is noted. No pleural effusion or pneumothorax is noted.  Bony thorax is intact.  IMPRESSION: No acute cardiopulmonary abnormality seen.   Original Report Authenticated By: Lupita Raider.,  M.D.   Ct Head Wo Contrast  08/23/2012   *RADIOLOGY REPORT*  Clinical Data: Fatigue.  CT HEAD  WITHOUT CONTRAST  Technique:  Contiguous axial images were obtained from the base of the skull through the vertex without contrast.  Comparison: 07/30/2011.  Findings: No intracranial hemorrhage.  Global atrophy without hydrocephalus.  Small vessel disease type changes without CT evidence of large acute infarct.  Vascular calcifications.  No intracranial mass lesion detected on this unenhanced exam.  Mastoid air cells, middle ear cavities and visualized paranasal sinuses are clear.  Orbital structures unremarkable.  IMPRESSION: No acute abnormality.  Please see above.   Original Report Authenticated By: Lacy Duverney, M.D.   Mr Brain Wo Contrast  08/23/2012   *RADIOLOGY REPORT*  Clinical Data:  Acute onset of left face, arm, and leg weakness.  MRI HEAD WITHOUT CONTRAST MRA HEAD WITHOUT CONTRAST  Technique:  Multiplanar, multiecho pulse sequences of the brain and surrounding structures were obtained without intravenous contrast. Angiographic images of the head were obtained using MRA technique without contrast.  Comparison:  CT head 08/23/2012.  MRI HEAD  Findings:  There is a significant area of acute right hemisphere infarction involving the right posterior frontal precentral gyrus, subcortical white matter, posterior insular ribbon, and right inferior frontal operculum.  This is non hemorrhagic.  No midline shift.  No mass lesion or hydrocephalus.  Moderately advanced atrophy.  Extensive chronic microvascular ischemic change.  No foci of chronic hemorrhage.  No osseous lesions.  Prior cervical fusion.  Normal pituitary and cerebellar tonsils.  Mild chronic sinus disease.  IMPRESSION: Acute right MCA territory infarction. This involves the right posterior frontal cortex, inferior frontal operculum, and right insular ribbon.  The distribution is consistent with the patient's history of left hemiparesis.  Atrophy and small vessel disease.  MRA HEAD  Findings: The internal carotid arteries are widely patent.  The  basilar artery is widely patent.  Vertebrals codominant.  There is no proximal or distal stenosis of the anterior cerebral arteries.  Both middle cerebral arteries are widely patent in their M1 segments, and in the proximal M2 segments.  There is a high-grade stenosis of a right M3 MCA branch, representing intracranial atherosclerotic change, subserving the precentral gyrus accounting for the observed infarction.  Posterior cerebral arteries widely patent.  No cerebellar branch occlusion.  No intracranial aneurysm.  IMPRESSION: No proximal intracranial stenosis or aneurysm.  Severe narrowing of a right MCA M3 branch likely accounts for the observed intracranial infarction.   Original Report Authenticated By: Davonna Belling, M.D.   Mr Mra Head/brain Wo Cm  08/23/2012   *RADIOLOGY REPORT*  Clinical Data:  Acute onset of left face, arm, and leg weakness.  MRI HEAD WITHOUT CONTRAST MRA HEAD WITHOUT CONTRAST  Technique:  Multiplanar, multiecho pulse sequences of the brain and surrounding structures were obtained without intravenous contrast. Angiographic images of the head were obtained using MRA technique without  contrast.  Comparison:  CT head 08/23/2012.  MRI HEAD  Findings:  There is a significant area of acute right hemisphere infarction involving the right posterior frontal precentral gyrus, subcortical white matter, posterior insular ribbon, and right inferior frontal operculum.  This is non hemorrhagic.  No midline shift.  No mass lesion or hydrocephalus.  Moderately advanced atrophy.  Extensive chronic microvascular ischemic change.  No foci of chronic hemorrhage.  No osseous lesions.  Prior cervical fusion.  Normal pituitary and cerebellar tonsils.  Mild chronic sinus disease.  IMPRESSION: Acute right MCA territory infarction. This involves the right posterior frontal cortex, inferior frontal operculum, and right insular ribbon.  The distribution is consistent with the patient's history of left hemiparesis.   Atrophy and small vessel disease.  MRA HEAD  Findings: The internal carotid arteries are widely patent.  The basilar artery is widely patent.  Vertebrals codominant.  There is no proximal or distal stenosis of the anterior cerebral arteries.  Both middle cerebral arteries are widely patent in their M1 segments, and in the proximal M2 segments.  There is a high-grade stenosis of a right M3 MCA branch, representing intracranial atherosclerotic change, subserving the precentral gyrus accounting for the observed infarction.  Posterior cerebral arteries widely patent.  No cerebellar branch occlusion.  No intracranial aneurysm.  IMPRESSION: No proximal intracranial stenosis or aneurysm.  Severe narrowing of a right MCA M3 branch likely accounts for the observed intracranial infarction.   Original Report Authenticated By: Davonna Belling, M.D.    Scheduled Meds: . docusate sodium  100 mg Oral BID  . gabapentin  100 mg Oral BID  . pantoprazole  40 mg Oral Daily  . predniSONE  20 mg Oral Q breakfast   Continuous Infusions: . sodium chloride 75 mL/hr at 08/24/12 1610    Active Problems:   GERD (gastroesophageal reflux disease)   Acute left-sided muscle weakness   Hypokalemia   CVA (cerebral infarction)    Time spent: 35    [facility]  [facility] Pager [phone]. If 7PM-7AM, please contact night-coverage at [email or web address], password [facility] 08/24/2012, 10:03 AM  LOS: 1 day

## 2012-08-24 NOTE — Progress Notes (Signed)
Patient heart rate has dropped to 39 nonsustained sinus bradycardia while sleeping.Patient asymptomatic heart rate ranging 45-60 sinus bradycardia .Dr.Walsh aware will continue to monitor patient.

## 2012-08-24 NOTE — Progress Notes (Signed)
Rehab Admissions Coordinator Note:  Patient was screened by Clois Dupes for appropriateness for an Inpatient Acute Rehab Consult.  At this time, we are recommending Inpatient Rehab consult.  Clois Dupes 08/24/2012, 1:16 PM  I can be reached at [phone].

## 2012-08-25 DIAGNOSIS — I6350 Cerebral infarction due to unspecified occlusion or stenosis of unspecified cerebral artery: Principal | ICD-10-CM

## 2012-08-25 LAB — CBC
MCH: 34 pg (ref 26.0–34.0)
MCHC: 33.4 g/dL (ref 30.0–36.0)
MCV: 101.6 fL — ABNORMAL HIGH (ref 78.0–100.0)
Platelets: 88 10*3/uL — ABNORMAL LOW (ref 150–400)
RBC: 3.09 MIL/uL — ABNORMAL LOW (ref 4.22–5.81)
RDW: 13.9 % (ref 11.5–15.5)

## 2012-08-25 LAB — BASIC METABOLIC PANEL
Calcium: 8.7 mg/dL (ref 8.4–10.5)
Creatinine, Ser: 1.01 mg/dL (ref 0.50–1.35)
GFR calc Af Amer: 78 mL/min — ABNORMAL LOW (ref >90)
GFR calc non Af Amer: 67 mL/min — ABNORMAL LOW (ref >90)
Sodium: 139 mEq/L (ref 135–145)

## 2012-08-25 NOTE — Progress Notes (Signed)
Stroke Team Progress Note  HISTORY  Brandon Hardy is an 79 y.o. male who was out in his yard doing yard work on Thursday 08/22/2012 when he noted his left arm was weaker than usual. When he was on his riding lawn mower he was unable to engage the blades with his left hand. Patient was brought to ED due to son noting left arm and leg weakness along with left facial droop. Patient does have history of GI bleed due to ulcer and esophageal bleed (secondary to ETOH) along with thrombocytopenia (PLT 89 on admission). Patient was not a tPA candidate due to being out of window.   Date last known well: Date: 08/22/2012  Time last known well: Unable to determine  tPA Given: No: unknown time onset and out of window   SUBJECTIVE No family members present today. Pt without complaints. Discussed TEE for tomorrow.  OBJECTIVE Most recent Vital Signs: Filed Vitals:   08/24/12 1817 08/24/12 2115 08/25/12 0216 08/25/12 0539  BP: 144/68 111/40 151/57 142/62  Pulse: 48 55 50 47  Temp: 98.1 F (36.7 C) 98.2 F (36.8 C) 97.7 F (36.5 C) 97.8 F (36.6 C)  TempSrc: Oral Oral Oral Oral  Resp: 20 20 20 20   Weight:      SpO2: 99% 99% 97% 100%   CBG (last 3)   Recent Labs  08/23/12 0927  GLUCAP 85    IV Fluid Intake:   . sodium chloride 75 mL/hr at 08/24/12 2032    MEDICATIONS  . aspirin EC  81 mg Oral Daily  . docusate sodium  100 mg Oral BID  . gabapentin  100 mg Oral BID  . pantoprazole  40 mg Oral Daily  . predniSONE  20 mg Oral Q breakfast   PRN:  acetaminophen, acetaminophen, acetaminophen-codeine, alum & mag hydroxide-simeth, magnesium hydroxide, ondansetron (ZOFRAN) IV, senna-docusate  Diet:  Cardiac thin liquids Activity:  Up with assistance DVT Prophylaxis:  SCDs  CLINICALLY SIGNIFICANT STUDIES Basic Metabolic Panel:   Recent Labs Lab 08/24/12 0430 08/25/12 0500  NA 140 139  K 5.1 4.7  CL 110 109  CO2 25 24  GLUCOSE 85 90  BUN 18 19  CREATININE 0.91 1.01  CALCIUM 8.6  8.7   Liver Function Tests:   Recent Labs Lab 08/23/12 0903  AST 26  ALT 13  ALKPHOS 67  BILITOT 1.3*  PROT 5.9*  ALBUMIN 2.8*   CBC:   Recent Labs Lab 08/23/12 0903 08/24/12 0430 08/25/12 0500  WBC 5.7 5.5 5.7  NEUTROABS 4.0  --   --   HGB 10.7* 10.2* 10.5*  HCT 31.6* 30.8* 31.4*  MCV 101.0* 102.0* 101.6*  PLT 83* 81* 88*   Coagulation:   Recent Labs Lab 08/23/12 0903  LABPROT 16.0*  INR 1.31   Cardiac Enzymes:   Recent Labs Lab 08/23/12 0907  TROPONINI <0.30   Urinalysis:   Recent Labs Lab 08/23/12 1410  COLORURINE YELLOW  LABSPEC 1.014  PHURINE 7.5  GLUCOSEU NEGATIVE  HGBUR NEGATIVE  BILIRUBINUR NEGATIVE  KETONESUR NEGATIVE  PROTEINUR NEGATIVE  UROBILINOGEN 1.0  NITRITE NEGATIVE  LEUKOCYTESUR NEGATIVE   Lipid Panel    Component Value Date/Time   CHOL 116 08/24/2012 0430   TRIG 46 08/24/2012 0430   HDL 56 08/24/2012 0430   CHOLHDL 2.1 08/24/2012 0430   VLDL 9 08/24/2012 0430   LDLCALC 51 08/24/2012 0430   HgbA1C  Lab Results  Component Value Date   HGBA1C 4.6 08/24/2012    Urine Drug  Screen:     Component Value Date/Time   LABOPIA POSITIVE* 08/23/2012 1410   COCAINSCRNUR NONE DETECTED 08/23/2012 1410   LABBENZ NONE DETECTED 08/23/2012 1410   AMPHETMU NONE DETECTED 08/23/2012 1410   THCU NONE DETECTED 08/23/2012 1410   LABBARB NONE DETECTED 08/23/2012 1410    Alcohol Level:   Recent Labs Lab 08/23/12 0903  ETH <11    Dg Chest 2 View 08/23/2012   No acute cardiopulmonary abnormality seen.    Ct Head Wo Contrast 08/23/2012 No acute abnormality.    Mr Brain Wo Contrast 08/23/2012 Acute right MCA territory infarction. This involves the right posterior frontal cortex, inferior frontal operculum, and right insular ribbon.  The distribution is consistent with the patient's history of left hemiparesis.  Atrophy and small vessel disease.    MRA HEAD   No proximal intracranial stenosis or aneurysm.  Severe narrowing of a right MCA M3  branch likely accounts for the observed intracranial infarction.    2D Echocardiogram  ejection fraction 55%. No cardiac source of embolism was identified.  Carotid Doppler  Preliminary findings: 1-39% ICA stenosis bilaterally.  CXR  No acute cardiopulmonary abnormality seen.  EKG  sinus rhythm rate 53 beats per minute  Therapy Recommendations - inpatient rehabilitation recommended - consult pending  Physical Exam    Neurologic Examination:  Blood pressure 122/46, pulse 50, temperature 98.6 F (37 C), temperature source Oral, resp. rate 12, SpO2 100.00%.  Mental Status:  Alert, oriented, thought content appropriate. Speech fluent without evidence of aphasia. Able to follow 3 step commands without difficulty.  Cranial Nerves:  II: Discs flat bilaterally; Visual fields grossly normal, pupils equal, round, reactive to light and accommodation  III,IV, VI: ptosis not present, extra-ocular motions intact bilaterally  V,VII: smile asymmetric on the left, facial light touch sensation decreased on the left  VIII: hearing normal bilaterally  IX,X: gag reflex present  XI: bilateral shoulder shrug  XII: midline tongue extension  Motor:  Right : Upper extremity 5/5 Left: Upper extremity 4/5 (drift)  Lower extremity 5/5 Lower extremity 4/5 (drift)  Tone and bulk:normal tone throughout; no atrophy noted  Sensory: Pinprick and light touch decreased on the left arm and leg with neglect of arm and leg to DSS  Deep Tendon Reflexes:  Right: Upper Extremity Left: Upper extremity  biceps (C-5 to C-6) 2/4 biceps (C-5 to C-6) 2/4  tricep (C7) 2/4 triceps (C7) 2/4  Brachioradialis (C6) 2/4 Brachioradialis (C6) 2/4  Lower Extremity Lower Extremity  quadriceps (L-2 to L-4) 2/4 quadriceps (L-2 to L-4) 2/4  Achilles (S1) 1/4 Achilles (S1) 1/4  Plantars:  Right: downgoing Left: downgoing  Cerebellar:  normal finger-to-nose on right with dysmetria on the left , normal heel-to-shin test on the right with  dysmetria on the left due to weakness  CV: pulses palpable throughout    ASSESSMENT Brandon Hardy is a 79 y.o. male presenting with left hemiparesis and left facial droop. TPA was not given as the patient was outside the therapeutic window.  Imaging confirms a right middle cerebral artery territory  Branch infarct. Infarct felt to be embolic secondary to unknown etiology..  On no antithrombotics prior to admission. Now on no antithrombotics for secondary stroke prevention due to thrombocytopenia.  Patient with resultant left hemiparesis. Work up underway.   Anemia  Thrombocytopenia  Urine drug screen positive for opiates  EtOH history  History of GI bleed   Prednisone therapy for arthritis.  Hospital day # 2  TREATMENT/PLAN  Start  aspirin 81 mg daily for secondary stroke prevention.  Monitor thrombocytopenia / anemia. Hemoglobin and hematocrit are stable. Platelets are 88,000 today.  Inpatient rehabilitation recommended by the therapists. Rehabilitation M.D. consult pending.  Will need TEE - Spoke with cardiology and made pt NPO after midnight. Pt aware.  Gi bleed history - check stool guiacs and CBC in am  Delton See PA-C [facility] Pager [phone] 08/25/2012, 8:18 AM  I have personally obtained a history, examined the patient, evaluated imaging results, and formulated the assessment and plan of care. I agree with the above.  Awaiting TEE in AM. NPO after midnight. Procedure explained to pt. Pauletta Browns

## 2012-08-25 NOTE — Progress Notes (Signed)
SLP Cancellation Note  Patient Details Name: MARLIN JARRARD MRN: [id number] DOB: 1927/10/22   Cancelled treatment:   ST received new order this date for SLE.  Cognitive Linguistic Evaluation completed on 08/24/12.   Moreen Fowler MS, CCC-SLP [phone] [facility] 08/25/2012, 11:44 AM

## 2012-08-25 NOTE — Progress Notes (Signed)
[facility] PROGRESS NOTE  Brandon Hardy:[id number] DOB: 04/20/1927 DOA: 08/23/2012 PCP: Gaye Alken, MD  Assessment/Plan: Acute left-sided muscle weakness/Acute R.MCA infarct -CT with no acute abnl  -obtain MRI with R.MCA infarct  -continue ASA  -Neuro to schedule for TEE -PT/OT>>rehab>>CIR consulted, await eval  GERD (gastroesophageal reflux disease)  -place on PPI  Hypokalemia  -resolved  Thrombocytopenia, chronic  -no gross bleeding  -avoiding lovenox, plt 88 today.follow on ASA H/O Chronic back pain  -followed by Dr Noel Gerold  -increase his prednisone to 20mg  , follow and decrease back to his outpt dose of 10mg  upon d/c  -CONITNUE tylenol #3 Remote H/O alcohol abuse  States he quit alcohol 92yrs ago  H/O GI bleed  -follow recheck hgb  Constipation Resolved, mom prn    Code Status: full Family Communication: son at bedside  Disposition Plan: will need rehab   Consultants:  neuro  Procedures:  Echo Study Conclusions  - Left ventricle: The cavity size was normal. Wall thickness was increased in a pattern of mild LVH. The estimated ejection fraction was 55%. Wall motion was normal; there were no regional wall motion abnormalities. - Aortic valve: Mild aortic stenosis. Mean gradient: 14mm Hg (S). Peak gradient: 26mm Hg (S). - Mitral valve: Mild regurgitation. - Left atrium: The atrium was mildly dilated. - Right ventricle: The cavity size was mildly dilated. Systolic function was mildly reduced. - Right atrium: The atrium was mildly dilated. - Impressions: No cardiac source of embolism was identified, but cannot be ruled out on the basis of this examination.     Antibiotics:  none  HPI/Subjective: +BM, denies any c/o  Objective: Filed Vitals:   08/25/12 1720  BP: 120/57  Pulse: 50  Temp: 98 F (36.7 C)  Resp: 20    Intake/Output Summary (Last 24 hours) at 08/25/12 2036 Last data filed at 08/25/12 1257  Gross per 24  hour  Intake      0 ml  Output    700 ml  Net   -700 ml   Filed Weights   08/23/12 1308  Weight: 61.281 kg (135 lb 1.6 oz)      Exam:  General: alert & oriented x 3 In NAD Cardiovascular: RRR, nl S1 s2 Respiratory: CTAB Abdomen: soft +BS NT/ND, no masses palpable Extremities: No cyanosis and no edema             Neuro: Facial assymetry LUE Strength 4/5, LLE strength 4/5 and R. Sided strength 5/5     Data Reviewed: Basic Metabolic Panel:  Recent Labs Lab 08/23/12 0903 08/24/12 0430 08/25/12 0500  NA 144 140 139  K 3.3* 5.1 4.7  CL 110 110 109  CO2 27 25 24   GLUCOSE 97 85 90  BUN 17 18 19   CREATININE 1.11 0.91 1.01  CALCIUM 8.9 8.6 8.7   Liver Function Tests:  Recent Labs Lab 08/23/12 0903  AST 26  ALT 13  ALKPHOS 67  BILITOT 1.3*  PROT 5.9*  ALBUMIN 2.8*   No results found for this basename: LIPASE, AMYLASE,  in the last 168 hours No results found for this basename: AMMONIA,  in the last 168 hours CBC:  Recent Labs Lab 08/23/12 0903 08/24/12 0430 08/25/12 0500  WBC 5.7 5.5 5.7  NEUTROABS 4.0  --   --   HGB 10.7* 10.2* 10.5*  HCT 31.6* 30.8* 31.4*  MCV 101.0* 102.0* 101.6*  PLT 83* 81* 88*   Cardiac Enzymes:  Recent Labs Lab 08/23/12 0907  TROPONINI <  0.30   BNP (last 3 results) No results found for this basename: PROBNP,  in the last 8760 hours CBG:  Recent Labs Lab 08/23/12 0927  GLUCAP 85    No results found for this or any previous visit (from the past 240 hour(s)).   Studies: No results found.  Scheduled Meds: . aspirin EC  81 mg Oral Daily  . docusate sodium  100 mg Oral BID  . gabapentin  100 mg Oral BID  . pantoprazole  40 mg Oral Daily  . predniSONE  20 mg Oral Q breakfast   Continuous Infusions: . sodium chloride 75 mL/hr at 08/24/12 2032    Active Problems:   GERD (gastroesophageal reflux disease)   Acute left-sided muscle weakness   Hypokalemia   CVA (cerebral infarction)    Time spent:  25    [facility] C  [facility] Pager [phone]. If 7PM-7AM, please contact night-coverage at [email or web address], password [facility] 08/25/2012, 8:36 PM  LOS: 2 days

## 2012-08-26 ENCOUNTER — Encounter (HOSPITAL_COMMUNITY)
Admission: EM | Disposition: A | Discharge status: Rehabilitation Facility | Payer: Self-pay | Source: Home / Self Care | Attending: Internal Medicine

## 2012-08-26 ENCOUNTER — Encounter (HOSPITAL_COMMUNITY): Payer: Self-pay | Admitting: *Deleted

## 2012-08-26 DIAGNOSIS — I6330 Cerebral infarction due to thrombosis of unspecified cerebral artery: Secondary | ICD-10-CM

## 2012-08-26 DIAGNOSIS — R609 Edema, unspecified: Secondary | ICD-10-CM

## 2012-08-26 DIAGNOSIS — I6789 Other cerebrovascular disease: Secondary | ICD-10-CM

## 2012-08-26 HISTORY — PX: TEE WITHOUT CARDIOVERSION: SHX5443

## 2012-08-26 LAB — CBC
HCT: 31.4 % — ABNORMAL LOW (ref 39.0–52.0)
Hemoglobin: 10.5 g/dL — ABNORMAL LOW (ref 13.0–17.0)
MCH: 34.2 pg — ABNORMAL HIGH (ref 26.0–34.0)
MCHC: 33.4 g/dL (ref 30.0–36.0)
MCV: 102.3 fL — ABNORMAL HIGH (ref 78.0–100.0)

## 2012-08-26 SURGERY — ECHOCARDIOGRAM, TRANSESOPHAGEAL
Anesthesia: Moderate Sedation

## 2012-08-26 MED ORDER — MIDAZOLAM HCL 10 MG/2ML IJ SOLN
INTRAMUSCULAR | Status: DC | PRN
  Administered 2012-08-26: 2 mg via INTRAVENOUS
  Administered 2012-08-26 (×2): 1 mg via INTRAVENOUS

## 2012-08-26 MED ORDER — LIDOCAINE VISCOUS 2 % MT SOLN
OROMUCOSAL | Status: DC | PRN
  Administered 2012-08-26: 10 via OROMUCOSAL

## 2012-08-26 MED ORDER — FENTANYL CITRATE 0.05 MG/ML IJ SOLN
INTRAMUSCULAR | Status: DC | PRN
  Administered 2012-08-26 (×2): 25 ug via INTRAVENOUS

## 2012-08-26 NOTE — Progress Notes (Signed)
Occupational Therapy Treatment Patient Details Name: Brandon Hardy MRN: [id number] DOB: 1927/07/25 Today's Date: 08/26/2012 Time: 8295-6213 OT Time Calculation (min): 24 min  OT Assessment / Plan / Recommendation  History of present illness  Brandon Hardy is a 79 y.o. male with medical problems as listed below who presents with the above complaints. He states that he was out mowing his lawn yesterday and went inside to use the bathroom and noticed that he was having difficulty unbuttoning his clothes. He Also  felt weak weak overall and more so his L. Side. His symptoms were persisting today and his son noted that he had a facial droop and weakness on his left side, so he was brought to ED. Pt reports he has had numbness in his hands due to"neck problems' for which he is followed by Dr Noel Gerold.   OT comments  Pt remains strong CIR candidate adn high fall risk. Pt demonstrates left inattention with ambulation. Pt needed constant cues with RW. Pt striking objects with RW on left side and continued to attempt to push RW into object. Pt needed cue to pull RW backward and then redirect it into a path that was safe. Pt does seem to respond well to male staff.  Follow Up Recommendations  CIR    Barriers to Discharge       Equipment Recommendations   (TBA)    Recommendations for Other Services Rehab consult  Frequency Min 3X/week   Progress towards OT Goals Progress towards OT goals: Progressing toward goals  Plan Discharge plan remains appropriate    Precautions / Restrictions Precautions Precautions: Fall Precaution Comments: cues to attend to left side  Restrictions Weight Bearing Restrictions: No   Pertinent Vitals/Pain Reports to RN he would like pain medication Pt reports to therapist "i need to see my nurse" when asked "what can we tell her you need' he states "she knows the medication I haven't had. "    ADL  Toilet Transfer: Min guard Toilet Transfer Method: Sit to  stand Toilet Transfer Equipment: Raised toilet seat with arms (or 3-in-1 over toilet) Toileting - Clothing Manipulation and Hygiene: Min guard Where Assessed - Toileting Clothing Manipulation and Hygiene: Sit to stand from 3-in-1 or toilet Equipment Used: Rolling walker;Cane Transfers/Ambulation Related to ADLs: Pt ambulating with RW and asking for cane use. pt provided cane with poor return demo. Pt reports "i was using a cane before I came up here" Pt reports wife uses a cane sometimes but her balance is good. pt walking into door ways, doors, therapist, bed and cords on left side during transfers. When therapist gives verbal cues / feedback pt states "yall got to many cords in here. You stuck your foot out in front of me on purpose" Ot standing to the left of patient as PT entered room with patient. Pt progressed in the direction of OT . OT remained in one spot and pt rolled over OT left foot. Pt given v/c you are running over my foot pt proceed to attempt to proceed forward. Pt with lack of awarenss to left side inattention. Pt with incr safety errors with RW with fatigue. Pt insist on a condom cath but reports he will use a urinal at home. When encouraged to use urinal at hospital he reports deficits holding the urinal. Pt with decr grasp holding glass and attempting to use Lt hand with Rt hand assisting to drink tea. Pt remains CIR candidate to d/c home at higher level of functional independence.  ADL Comments: Pt is a fall risk and reports falling in last 6 months. Pt states "i fall all the time my wife is the steady one" pt defensive verbally during session when challenged. The education had to be a question and answer type of session to let patient create the answer to keep him engaged. Pt with chair alarm placed. Pt very aware of alarm and states "they have bells on everything here"    OT Diagnosis:    OT Problem List:   OT Treatment Interventions:     OT Goals(current goals can now be found in  the care plan section) Acute Rehab OT Goals Patient Stated Goal: I want to go home as soon as I can, my wife and I need each other OT Goal Formulation: With patient Time For Goal Achievement: 09/07/12 Potential to Achieve Goals: Good ADL Goals Pt Will Perform Grooming: with supervision;standing Pt Will Perform Upper Body Bathing: with supervision;sitting Pt Will Perform Lower Body Bathing: with supervision;sit to/from stand Pt Will Perform Upper Body Dressing: with supervision;sitting Pt Will Perform Lower Body Dressing: with supervision;sit to/from stand Pt Will Transfer to Toilet: with supervision;ambulating;regular height toilet Pt Will Perform Toileting - Clothing Manipulation and hygiene: with supervision;sit to/from stand  Visit Information  Last OT Received On: 08/26/12 Assistance Needed: +1 Reason Eval/Treat Not Completed: Patient at procedure or test/ unavailable (dopplers off the floor) History of Present Illness:  Brandon Hardy is a 79 y.o. male with medical problems as listed below who presents with the above complaints. He states that he was out mowing his lawn yesterday and went inside to use the bathroom and noticed that he was having difficulty unbuttoning his clothes. He Also  felt weak weak overall and more so his L. Side. His symptoms were persisting today and his son noted that he had a facial droop and weakness on his left side, so he was brought to ED. Pt reports he has had numbness in his hands due to"neck problems' for which he is followed by Dr Noel Gerold.    Subjective Data      Prior Functioning       Cognition  Cognition Arousal/Alertness: Awake/alert Behavior During Therapy: WFL for tasks assessed/performed Overall Cognitive Status: Impaired/Different from baseline Area of Impairment: Memory;Following commands;Safety/judgement;Problem solving Memory: Decreased recall of precautions Following Commands: Follows one step commands  consistently Safety/Judgement: Decreased awareness of safety;Decreased awareness of deficits Problem Solving: Slow processing    Mobility  Bed Mobility Bed Mobility: Not assessed Supine to Sit: 4: Min assist;HOB flat Sitting - Scoot to Edge of Bed: 4: Min assist Details for Bed Mobility Assistance: pt with tendency to list posteriorly, L UE used clumsily, but better than on evaluation Transfers Sit to Stand: 4: Min assist;With upper extremity assist;From bed Stand to Sit: 4: Min guard;With upper extremity assist;To chair/3-in-1 Details for Transfer Assistance: vc for hand placement    Exercises      Balance Balance Balance Assessed: Yes Static Sitting Balance Static Sitting - Balance Support: Feet supported;Right upper extremity supported;Left upper extremity supported Static Sitting - Level of Assistance: 5: Stand by assistance Dynamic Sitting Balance Dynamic Sitting - Balance Support: Feet supported;Right upper extremity supported;Left upper extremity supported Dynamic Sitting - Level of Assistance: 4: Min assist Dynamic Sitting - Comments: pt frequently listing posteriorly and to left when trying to get to EOB to prep for standing   End of Session OT - End of Session Activity Tolerance: Patient tolerated treatment well Patient left: in chair;with  call bell/phone within reach;with bed alarm set Nurse Communication: Mobility status;Precautions  GO     Lucile Shutters 08/26/2012, 4:37 PM Pager: [phone]

## 2012-08-26 NOTE — Progress Notes (Signed)
I await further therapy progress to assist in determining pt's current functional status. Noted TEE today. [phone]

## 2012-08-26 NOTE — Progress Notes (Signed)
Physical Therapy Treatment Patient Details Name: Brandon Hardy MRN: [id number] DOB: 07/23/1927 Today's Date: 08/26/2012 Time: 4098-1191 PT Time Calculation (min): 27 min  PT Assessment / Plan / Recommendation  History of Present Illness  Brandon Hardy is a 79 y.o. male with medical problems as listed below who presents with the above complaints. He states that he was out mowing his lawn yesterday and went inside to use the bathroom and noticed that he was having difficulty unbuttoning his clothes. He Also  felt weak weak overall and more so his L. Side. His symptoms were persisting today and his son noted that he had a facial droop and weakness on his left side, so he was brought to ED. Pt reports he has had numbness in his hands due to"neck problems' for which he is followed by Dr Noel Gerold.   PT Comments   Pt poses a significant safety risk with decreased balance with more dynamic tasks, general mobility and unstable gait.  Pt frequently running into barriers/obstacles on the Left.  He could still use some inpatient rehab.   Follow Up Recommendations  CIR;Supervision for mobility/OOB     Does the patient have the potential to tolerate intense rehabilitation     Barriers to Discharge        Equipment Recommendations  None recommended by PT    Recommendations for Other Services Rehab consult  Frequency Min 3X/week   Progress towards PT Goals Progress towards PT goals: Progressing toward goals  Plan Current plan remains appropriate    Precautions / Restrictions Precautions Precautions: Fall Restrictions Weight Bearing Restrictions: No   Pertinent Vitals/Pain     Mobility  Bed Mobility Bed Mobility: Supine to Sit;Sitting - Scoot to Edge of Bed Supine to Sit: 4: Min assist;HOB flat Sitting - Scoot to [place] of Bed: 4: Min assist Details for Bed Mobility Assistance: pt with tendency to list posteriorly, L UE used clumsily, but better than on evaluation Transfers Transfers: Sit to  Stand;Stand to Sit Sit to Stand: 4: Min assist;With upper extremity assist;From bed Stand to Sit: 4: Min guard;With upper extremity assist;To chair/3-in-1 Details for Transfer Assistance: vc's for hand placement, steady assist; used legs against bed for stabiliy Ambulation/Gait Ambulation/Gait Assistance: 4: Min assist Ambulation Distance (Feet): 100 Feet Assistive device: Rolling walker Ambulation/Gait Assistance Details: pt's gait characterized by unequal step length with trunk biased toward left rotation.  pt frequently running into walls and objects on the left and not aware of all of the obstructions.  Attempted to use a cane to appease him and pt unable to find his balance Gait Pattern: Step-through pattern Stairs: Yes Stairs Assistance: 3: Mod assist;4: Min assist Stairs Assistance Details (indicate cue type and reason): pt frequently stepping up with weaker leg and down with stronger leg and therefore much more unsafe than it needed to be. Stair Management Technique: One rail Right;Step to pattern;Sideways;Other (comment) (rail held with both hands) Number of Stairs: 5 Modified Rankin (Stroke Patients Only) Pre-Morbid Rankin Score: No symptoms Modified Rankin: Moderately severe disability    Exercises     PT Diagnosis:    PT Problem List:   PT Treatment Interventions:     PT Goals (current goals can now be found in the care plan section) Acute Rehab PT Goals Patient Stated Goal: I want to go home as soon as I can, my wife and I need each other PT Goal Formulation: With patient Time For Goal Achievement: 09/07/12 Potential to Achieve Goals: Good  Visit  Information  Last PT Received On: 08/26/12 Assistance Needed: +1 History of Present Illness:  Brandon Hardy is a 79 y.o. male with medical problems as listed below who presents with the above complaints. He states that he was out mowing his lawn yesterday and went inside to use the bathroom and noticed that he was having  difficulty unbuttoning his clothes. He Also  felt weak weak overall and more so his L. Side. His symptoms were persisting today and his son noted that he had a facial droop and weakness on his left side, so he was brought to ED. Pt reports he has had numbness in his hands due to"neck problems' for which he is followed by Dr Noel Gerold.    Subjective Data  Subjective: If ya'll didn't have so many cords everywhere I would run into them. Patient Stated Goal: I want to go home as soon as I can, my wife and I need each other   Cognition  Cognition Arousal/Alertness: Awake/alert Behavior During Therapy: WFL for tasks assessed/performed Overall Cognitive Status: Impaired/Different from baseline Area of Impairment: Following commands;Safety/judgement;Problem solving Following Commands: Follows one step commands consistently Safety/Judgement: Decreased awareness of deficits;Decreased awareness of safety    Balance  Balance Balance Assessed: Yes Static Sitting Balance Static Sitting - Balance Support: Feet supported;Right upper extremity supported;Left upper extremity supported Static Sitting - Level of Assistance: 5: Stand by assistance Dynamic Sitting Balance Dynamic Sitting - Balance Support: Feet supported;Right upper extremity supported;Left upper extremity supported Dynamic Sitting - Level of Assistance: 4: Min assist Dynamic Sitting - Comments: pt frequently listing posteriorly and to left when trying to get to EOB to prep for standing  End of Session PT - End of Session Activity Tolerance: Patient tolerated treatment well Patient left: in chair;with call bell/phone within reach;with chair alarm set Nurse Communication: Mobility status   GP     Brandon Hardy, Brandon Hardy 08/26/2012, 4:28 PM 08/26/2012  [name], PT [phone]  (pager)

## 2012-08-26 NOTE — Progress Notes (Addendum)
[facility] PROGRESS NOTE  Brandon Hardy:[id number] DOB: 05/19/1927 DOA: 08/23/2012 PCP: Gaye Alken, MD  Assessment/Plan: Acute left-sided muscle weakness/Acute R.MCA infarct -CT with no acute abnl  -obtain MRI with R.MCA infarct  -continue ASA  -appreciate Neuro assistance>>TEE with small PFO and doppler of LE ordered to eval for DVT, follow -PT/OT>>rehab>>CIR consulted, await final recs GERD (gastroesophageal reflux disease)  -place on PPI  Hypokalemia  -resolved  Thrombocytopenia, chronic  -no gross bleeding  -avoiding lovenox, plt increasing, 95 today.follow on ASA H/O Chronic back pain  -followed by Dr Noel Gerold  -increase his prednisone to 20mg  , follow and decrease back to his outpt dose of 10mg  upon d/c  -CONITNUE tylenol #3 Remote H/O alcohol abuse  States he quit alcohol 37yrs ago  H/O GI bleed  -follow recheck hgb  Constipation Resolved, mom prn PFO -Per TEE -per Neuro incidental -doppler of LE to eval for DVT, follow  Code Status: full Family Communication: son at bedside  Disposition Plan: will need rehab   Consultants:  Neuro  CARDS- for TEE  Procedures:  Echo Study Conclusions  - Left ventricle: The cavity size was normal. Wall thickness was increased in a pattern of mild LVH. The estimated ejection fraction was 55%. Wall motion was normal; there were no regional wall motion abnormalities. - Aortic valve: Mild aortic stenosis. Mean gradient: 14mm Hg (S). Peak gradient: 26mm Hg (S). - Mitral valve: Mild regurgitation. - Left atrium: The atrium was mildly dilated. - Right ventricle: The cavity size was mildly dilated. Systolic function was mildly reduced. - Right atrium: The atrium was mildly dilated. - Impressions: No cardiac source of embolism was identified, but cannot be ruled out on the basis of this examination.     Antibiotics:  none  HPI/Subjective:  denies any c/o, s/p  TEE-somnolent.  Objective: Filed Vitals:   08/26/12 1121  BP: 127/46  Pulse: 47  Temp:   Resp: 12    Intake/Output Summary (Last 24 hours) at 08/26/12 1229 Last data filed at 08/26/12 0659  Gross per 24 hour  Intake      0 ml  Output   1800 ml  Net  -1800 ml   Filed Weights   08/23/12 1308  Weight: 61.281 kg (135 lb 1.6 oz)      Exam:  General: alert & oriented x 3 In NAD Cardiovascular: RRR, nl S1 s2 Respiratory: CTAB Abdomen: soft +BS NT/ND, no masses palpable Extremities: No cyanosis and no edema             Neuro: Facial assymetry LUE Strength 4/5, LLE strength 4/5 and R. Sided strength 5/5     Data Reviewed: Basic Metabolic Panel:  Recent Labs Lab 08/23/12 0903 08/24/12 0430 08/25/12 0500  NA 144 140 139  K 3.3* 5.1 4.7  CL 110 110 109  CO2 27 25 24   GLUCOSE 97 85 90  BUN 17 18 19   CREATININE 1.11 0.91 1.01  CALCIUM 8.9 8.6 8.7   Liver Function Tests:  Recent Labs Lab 08/23/12 0903  AST 26  ALT 13  ALKPHOS 67  BILITOT 1.3*  PROT 5.9*  ALBUMIN 2.8*   No results found for this basename: LIPASE, AMYLASE,  in the last 168 hours No results found for this basename: AMMONIA,  in the last 168 hours CBC:  Recent Labs Lab 08/23/12 0903 08/24/12 0430 08/25/12 0500 08/26/12 0445  WBC 5.7 5.5 5.7 5.3  NEUTROABS 4.0  --   --   --  HGB 10.7* 10.2* 10.5* 10.5*  HCT 31.6* 30.8* 31.4* 31.4*  MCV 101.0* 102.0* 101.6* 102.3*  PLT 83* 81* 88* 95*   Cardiac Enzymes:  Recent Labs Lab 08/23/12 0907  TROPONINI <0.30   BNP (last 3 results) No results found for this basename: PROBNP,  in the last 8760 hours CBG:  Recent Labs Lab 08/23/12 0927  GLUCAP 85    No results found for this or any previous visit (from the past 240 hour(s)).   Studies: No results found.  Scheduled Meds: . aspirin EC  81 mg Oral Daily  . docusate sodium  100 mg Oral BID  . gabapentin  100 mg Oral BID  . pantoprazole  40 mg Oral Daily  . predniSONE  20 mg  Oral Q breakfast   Continuous Infusions:    Active Problems:   GERD (gastroesophageal reflux disease)   Acute left-sided muscle weakness   Hypokalemia   CVA (cerebral infarction)    Time spent: 25    [facility]  [facility] Pager [phone]. If 7PM-7AM, please contact night-coverage at [email or web address], password [facility] 08/26/2012, 12:29 PM  LOS: 3 days

## 2012-08-26 NOTE — Progress Notes (Signed)
OT Cancellation Note  Patient Details Name: Brandon Hardy MRN: [id number] DOB: 07/24/1927   Cancelled Treatment:    Reason Eval/Treat Not Completed: Patient at procedure or test/ unavailable (dopplers off the floor)  Harrel Carina [facility] 08/26/2012, 3:20 PM

## 2012-08-26 NOTE — Progress Notes (Signed)
Stroke Team Progress Note  HISTORY Brandon Hardy is an 79 y.o. male who was out in his yard doing yard work on Thursday 08/22/2012 when he noted his left arm was weaker than usual. When he was on his riding lawn mower he was unable to engage the blades with his left hand. Patient was brought to ED 08/23/2012 at 0804 due to son noting left arm and leg weakness along with left facial droop. Patient does have history of GI bleed due to ulcer and esophageal bleed (secondary to ETOH) along with thrombocytopenia (PLT 89 on admission). Patient was not a tPA candidate due to being out of window. He was admitted for further evaluation.  SUBJECTIVE Patient in endo recovery post TEE.  OBJECTIVE Most recent Vital Signs: Filed Vitals:   08/26/12 1055 08/26/12 1100 08/26/12 1105 08/26/12 1114  BP: 147/56 140/106 139/59 151/103  Pulse:      Temp:      TempSrc:      Resp: 13 16 17    Weight:      SpO2: 99% 100% 100%    CBG (last 3)  No results found for this basename: GLUCAP,  in the last 72 hours  IV Fluid Intake:   . sodium chloride 75 mL/hr at 08/25/12 2204    MEDICATIONS  . aspirin EC  81 mg Oral Daily  . docusate sodium  100 mg Oral BID  . gabapentin  100 mg Oral BID  . pantoprazole  40 mg Oral Daily  . predniSONE  20 mg Oral Q breakfast   PRN:  acetaminophen, acetaminophen, acetaminophen-codeine, alum & mag hydroxide-simeth, magnesium hydroxide, ondansetron (ZOFRAN) IV, senna-docusate  Diet:  NPO  Activity:  Up with assistance DVT Prophylaxis:  SCDs  CLINICALLY SIGNIFICANT STUDIES Basic Metabolic Panel:   Recent Labs Lab 08/24/12 0430 08/25/12 0500  NA 140 139  K 5.1 4.7  CL 110 109  CO2 25 24  GLUCOSE 85 90  BUN 18 19  CREATININE 0.91 1.01  CALCIUM 8.6 8.7   Liver Function Tests:   Recent Labs Lab 08/23/12 0903  AST 26  ALT 13  ALKPHOS 67  BILITOT 1.3*  PROT 5.9*  ALBUMIN 2.8*   CBC:   Recent Labs Lab 08/23/12 0903  08/25/12 0500 08/26/12 0445  WBC 5.7   < > 5.7 5.3  NEUTROABS 4.0  --   --   --   HGB 10.7*  < > 10.5* 10.5*  HCT 31.6*  < > 31.4* 31.4*  MCV 101.0*  < > 101.6* 102.3*  PLT 83*  < > 88* 95*  < > = values in this interval not displayed. Coagulation:   Recent Labs Lab 08/23/12 0903  LABPROT 16.0*  INR 1.31   Cardiac Enzymes:   Recent Labs Lab 08/23/12 0907  TROPONINI <0.30   Urinalysis:   Recent Labs Lab 08/23/12 1410  COLORURINE YELLOW  LABSPEC 1.014  PHURINE 7.5  GLUCOSEU NEGATIVE  HGBUR NEGATIVE  BILIRUBINUR NEGATIVE  KETONESUR NEGATIVE  PROTEINUR NEGATIVE  UROBILINOGEN 1.0  NITRITE NEGATIVE  LEUKOCYTESUR NEGATIVE   Lipid Panel    Component Value Date/Time   CHOL 116 08/24/2012 0430   TRIG 46 08/24/2012 0430   HDL 56 08/24/2012 0430   CHOLHDL 2.1 08/24/2012 0430   VLDL 9 08/24/2012 0430   LDLCALC 51 08/24/2012 0430   HgbA1C  Lab Results  Component Value Date   HGBA1C 4.6 08/24/2012    Urine Drug Screen:     Component Value Date/Time  LABOPIA POSITIVE* 08/23/2012 1410   COCAINSCRNUR NONE DETECTED 08/23/2012 1410   LABBENZ NONE DETECTED 08/23/2012 1410   AMPHETMU NONE DETECTED 08/23/2012 1410   THCU NONE DETECTED 08/23/2012 1410   LABBARB NONE DETECTED 08/23/2012 1410    Alcohol Level:   Recent Labs Lab 08/23/12 0903  ETH <11    Dg Chest 2 View 08/23/2012   No acute cardiopulmonary abnormality seen.    Ct Head Wo Contrast 08/23/2012 No acute abnormality.    Mr Brain Wo Contrast 08/23/2012 Acute right MCA territory infarction. This involves the right posterior frontal cortex, inferior frontal operculum, and right insular ribbon.  The distribution is consistent with the patient's history of left hemiparesis.  Atrophy and small vessel disease.    MRA HEAD   No proximal intracranial stenosis or aneurysm.  Severe narrowing of a right MCA M3 branch likely accounts for the observed intracranial infarction.   2D Echocardiogram  ejection fraction 55%. No cardiac source of embolism was  identified.  Carotid Doppler  Preliminary findings: 1-39% ICA stenosis bilaterally.  TEE 08/26/2012 Tiny PFO as tested by color doppler and with injection of agitated saline Mild to mod fixed atherosclerotic plaques in aorta.  CXR  No acute cardiopulmonary abnormality seen.  EKG  sinus rhythm rate 53 beats per minute  Therapy Recommendations  CIR  Physical Exam   Neurologic Examination:  Blood pressure 122/46, pulse 50, temperature 98.6 F (37 C), temperature source Oral, resp. rate 12, SpO2 100.00%.  Mental Status:  Alert, oriented, thought content appropriate. Speech fluent without evidence of aphasia. Able to follow 3 step commands without difficulty.  Cranial Nerves:  II: Discs flat bilaterally; Visual fields grossly normal, pupils equal, round, reactive to light and accommodation  III,IV, VI: ptosis not present, extra-ocular motions intact bilaterally  V,VII: smile asymmetric on the left, facial light touch sensation decreased on the left  VIII: hearing normal bilaterally  IX,X: gag reflex present  XI: bilateral shoulder shrug  XII: midline tongue extension  Motor:  Right : Upper extremity 5/5 Left: Upper extremity 4/5 (drift)  Lower extremity 5/5 Lower extremity 4/5 (drift)  Tone and bulk:normal tone throughout; no atrophy noted  Sensory: Pinprick and light touch decreased on the left arm and leg with neglect of arm and leg to DSS  Deep Tendon Reflexes:  Right: Upper Extremity Left: Upper extremity  biceps (C-5 to C-6) 2/4 biceps (C-5 to C-6) 2/4  tricep (C7) 2/4 triceps (C7) 2/4  Brachioradialis (C6) 2/4 Brachioradialis (C6) 2/4  Lower Extremity Lower Extremity  quadriceps (L-2 to L-4) 2/4 quadriceps (L-2 to L-4) 2/4  Achilles (S1) 1/4 Achilles (S1) 1/4  Plantars:  Right: downgoing Left: downgoing  Cerebellar:  normal finger-to-nose on right with dysmetria on the left , normal heel-to-shin test on the right with dysmetria on the left due to weakness  CV: pulses  palpable throughout    ASSESSMENT Brandon Hardy is a 79 y.o. male presenting with left hemiparesis and left facial droop. TPA was not given as the patient was outside the therapeutic window.  Imaging confirms a right middle cerebral artery territory  Branch infarct, likely thrombotic given severe R MCA M3 intracranial stenosis. On no antithrombotics prior to admission. Now on low dose aspirin.  Patient with resultant left hemiparesis. Work up completed.  PFO (patent foramen ovale) likely an incidental finding.   Anemia  Thrombocytopenia, chronic, PLT 95, not on lovenox for VTE, low dose aspirin for stroke, most likely not a long term anticoagulation candidate  if needed  Urine drug screen positive for opiates  EtOH history, MCV slightly elevated  History of GI bleed, Hgb remains stable at 10.5  Chronic back pain, arthritis, on Prednisone therapy  Hospital day # 3  TREATMENT/PLAN  Continue aspirin 81 mg daily for secondary stroke prevention.  Disposition: rehab MD feels there could be little to gain fron CIR - Home health vs SNF - rehab RN to follow up with family to further assess and put forth final recommendation. Would need home health therapy if pt is discharged home. Will check LE venous dopplers for DVT as possible cause of stroke given PFO. Not likely a good anticoagulation candidate if clot found.   Annie Main, MSN, RN, ANVP-BC, ANP-BC, [facility] Pager: [phone] 08/26/2012 11:20 AM  I have personally obtained a history, examined the patient, evaluated imaging results, and formulated the assessment and plan of care. I agree with the above. Delia Heady, MD

## 2012-08-26 NOTE — Progress Notes (Signed)
VASCULAR LAB PRELIMINARY  PRELIMINARY  PRELIMINARY  PRELIMINARY  Bilateral lower extremity venous duplex completed.    Preliminary report:  Bilateral:  No evidence of DVT, superficial thrombosis, or Baker's Cyst.   Brandon Hardy, RVS 08/26/2012, 3:22 PM

## 2012-08-26 NOTE — Progress Notes (Signed)
  Echocardiogram Echocardiogram Transesophageal has been performed.  [place] 08/26/2012, 11:11 AM

## 2012-08-26 NOTE — Consult Note (Signed)
Physical Medicine and Rehabilitation Consult Reason for Consult: CVA  Referring Physician: Triad   HPI: Brandon Hardy is a 79 y.o. right-handed male admitted 08/23/2012 with acute left-sided weakness while mowing his lawn. MRI of the brain showed acute right MCA territory infarction. Hypokalemia 3.3 with supplement added. MRA of the head with no proximal stenosis or aneurysm. Echocardiogram with ejection fraction of 55% without emboli. Carotid Dopplers with no ICA stenosis. TEE pending. Patient did not receive TPA. Patient has a history of thrombocytopenia with platelet count 89,000 on admission. Neurology services consulted placed on aspirin therapy for CVA prophylaxis. Patient maintained on low-dose prednisone for history of arthritis. Physical occupational therapy evaluations completed 08/24/2012 with recommendations for physical medicine rehabilitation consult.   Review of Systems  Cardiovascular: Positive for leg swelling.  Gastrointestinal: Positive for constipation.  Genitourinary: Positive for frequency.  Musculoskeletal: Positive for myalgias and joint pain.  Neurological: Positive for weakness.  All other systems reviewed and are negative.   Past Medical History  Diagnosis Date  . Arthritis   . Blood transfusion   . Renal disorder   . Thrombocytopenia   . Heart murmur   . Chronic back pain   . Peripheral edema     was on Lasix but has been off for about 3months  . GERD (gastroesophageal reflux disease)     takes Prilosec daily  . GI bleed     hx of   . Constipation     takes Senna daily and eats Metamucil daily  . Urinary frequency   . History of kidney stones   . History of blood transfusion     no abnormal reaction noted  . Cataracts, bilateral     immature  . History of blood clots     left leg  . History of Clostridium difficile infection   . BPH (benign prostatic hyperplasia)   . Stroke 08/22/2012   Past Surgical History  Procedure Laterality Date  .  Fracture surgery    . Back surgery      multiple  . Neck surgery  2013    x 2/plates, screws, decreased mobility  . Left finger surgery    . Foot surgery    . Colonoscopy    . Esophagogastroduodenoscopy    . Gi bleed    . Ulnar nerve transposition  01/09/2012    Procedure: ULNAR NERVE DECOMPRESSION/TRANSPOSITION;  Surgeon: Christopher Y Blackman, MD;  Location: [facility] OR;  Service: Orthopedics;  Laterality: Right;  Right ulnar nerve decompression at the elbow/cubital tunnel and transposition  . Inguinal hernia repair Right 04/23/2012    Procedure: HERNIA REPAIR INGUINAL ADULT;  Surgeon: David H Newman, MD;  Location: [facility] ORS;  Service: General;  Laterality: Right;  . Insertion of mesh Right 04/23/2012    Procedure: INSERTION OF MESH;  Surgeon: David H Newman, MD;  Location: [facility] ORS;  Service: General;  Laterality: Right;   History reviewed. No pertinent family history. Social History:  reports that he quit smoking about 39 years ago. His smoking use included Cigarettes. He smoked 0.00 packs per day. He has never used smokeless tobacco. He reports that he does not drink alcohol or use illicit drugs. Allergies:  Allergies  Allergen Reactions  . Chlorhexidine Rash   Medications Prior to Admission  Medication Sig Dispense Refill  . docusate sodium (COLACE) 100 MG capsule Take 100 mg by mouth 2 (two) times daily.      . gabapentin (NEURONTIN) 100 MG capsule Take 100 mg by mouth   2 (two) times daily.       . omeprazole (PRILOSEC) 20 MG capsule Take 20 mg by mouth daily.      . predniSONE (DELTASONE) 10 MG tablet Take 10 mg by mouth daily.      . riboflavin (VITAMIN B-2) 100 MG TABS tablet Take 100 mg by mouth daily.        Home: Home Living Family/patient expects to be discharged to:: Private residence Living Arrangements: Spouse/significant other Available Help at Discharge: Family;Available PRN/intermittently Type of Home: House Home Access: Stairs to enter Entrance Stairs-Number of Steps:  4; 2 in garage Entrance Stairs-Rails: Left;Right;Can reach both (no rails in garage entrance) Home Layout: One level Home Equipment: Walker - 2 wheels;Cane - single point;Grab bars - toilet;Tub bench;Shower seat;Hand held shower head Additional Comments: Wife is staying with son while husband is at hospital, she has had h/o CVA. Pt reports he does not want to depend on his wife.  Lives With: Spouse  Functional History: Prior Function Vocation: Retired Functional Status:  Mobility: Bed Mobility Bed Mobility: Supine to Sit;Sitting - Scoot to Edge of Bed Supine to Sit: 4: Min assist;HOB flat Sitting - Scoot to Edge of Bed: 4: Min guard Transfers Transfers: Sit to Stand;Stand to Sit Sit to Stand: 4: Min assist;With upper extremity assist;From bed Stand to Sit: 4: Min assist;To chair/3-in-1 Ambulation/Gait Ambulation/Gait Assistance: 4: Min assist Ambulation Distance (Feet): 70 Feet Assistive device: Rolling walker Ambulation/Gait Assistance Details: mildly unsteady thoughout, mildly HP with flat foot initial contact.  needed assist managing RW. Gait Pattern: Step-through pattern Stairs: No Wheelchair Mobility Wheelchair Mobility: No  ADL: ADL Eating/Feeding: Performed;Modified independent Where Assessed - Eating/Feeding: Chair Upper Body Bathing: Minimal assistance;Simulated Where Assessed - Upper Body Bathing: Unsupported sitting Lower Body Bathing: Simulated;Moderate assistance Where Assessed - Lower Body Bathing: Supported sit to stand Upper Body Dressing: Performed;Minimal assistance Where Assessed - Upper Body Dressing: Unsupported sitting Lower Body Dressing: Performed;Maximal assistance Where Assessed - Lower Body Dressing: Supported sit to stand Toilet Transfer: Simulated;Minimal assistance Toilet Transfer Method: Sit to stand (ambulating) Toilet Transfer Equipment:  (chair) Equipment Used: Gait belt;Rolling walker Transfers/Ambulation Related to ADLs: Min assist with  RW. Assist for steadying and for safe use of RW. Pt with tendency to veer left while ambulating. Frequent assist to repositiong left hand on RW (decreased proprioception and grip strength). ADL Comments: Decreased awareness of Left side during all ADL and mobility tasks.  Cognition: Cognition Overall Cognitive Status: Within Functional Limits for tasks assessed Arousal/Alertness: Awake/alert Orientation Level: Oriented X4 Attention: Selective Selective Attention: Appears intact Memory: Appears intact Awareness: Appears intact Problem Solving: Appears intact Executive Function: Reasoning;Sequencing Reasoning: Appears intact Sequencing: Appears intact Safety/Judgment: Appears intact Cognition Arousal/Alertness: Awake/alert Behavior During Therapy: WFL for tasks assessed/performed Overall Cognitive Status: Within Functional Limits for tasks assessed Area of Impairment: Following commands;Problem solving;Safety/judgement Following Commands: Follows one step commands consistently;Follows multi-step commands inconsistently Safety/Judgement: Decreased awareness of deficits;Decreased awareness of safety Problem Solving: Slow processing;Requires verbal cues  Blood pressure 159/62, pulse 50, temperature 97.8 F (36.6 C), temperature source Oral, resp. rate 18, weight 61.281 kg (135 lb 1.6 oz), SpO2 99.00%. Physical Exam  Vitals reviewed. Constitutional: He is oriented to person, place, and time.  HENT:  Head: Normocephalic.  Eyes: EOM are normal.  Neck: Normal range of motion. Neck supple. No thyromegaly present.  Cardiovascular: Regular rhythm.   Pulmonary/Chest: Effort normal and breath sounds normal. No respiratory distress.  Abdominal: Soft. Bowel sounds are normal. He exhibits no distension.    Neurological: He is alert and oriented to person, place, and time.  Follows commands. Very alert. Mild left central 7. Mild left pronator drift. Strength nearly symmetrical in all 4's at 4/5.  No gross sensory deficits.   Skin: Skin is warm and dry.  Psychiatric: He has a normal mood and affect.    Results for orders placed during the hospital encounter of 08/23/12 (from the past 24 hour(s))  OCCULT BLOOD X 1 CARD TO LAB, STOOL     Status: None   Collection Time    08/25/12  5:14 PM      Result Value Range   Fecal Occult Bld NEGATIVE  NEGATIVE  CBC     Status: Abnormal   Collection Time    08/26/12  4:45 AM      Result Value Range   WBC 5.3  4.0 - 10.5 K/uL   RBC 3.07 (*) 4.22 - 5.81 MIL/uL   Hemoglobin 10.5 (*) 13.0 - 17.0 g/dL   HCT 31.4 (*) 39.0 - 52.0 %   MCV 102.3 (*) 78.0 - 100.0 fL   MCH 34.2 (*) 26.0 - 34.0 pg   MCHC 33.4  30.0 - 36.0 g/dL   RDW 13.8  11.5 - 15.5 %   Platelets 95 (*) 150 - 400 K/uL   No results found.  Assessment/Plan: Diagnosis: right MCA infarct 1. Does the need for close, 24 hr/day medical supervision in concert with the patient's rehab needs make it unreasonable for this patient to be served in a less intensive setting? Potentially 2. Co-Morbidities requiring supervision/potential complications: GERD, premorbid gait disorder? 3. Due to bladder management, bowel management, safety, skin/wound care, disease management, medication administration and patient education, does the patient require 24 hr/day rehab nursing? Potentially 4. Does the patient require coordinated care of a physician, rehab nurse, PT (1-2 hrs/day, 5 days/week) and OT (1-2 hrs/day, 5 days/week) to address physical and functional deficits in the context of the above medical diagnosis(es)? Potentially Addressing deficits in the following areas: balance, endurance, locomotion, strength, transferring, bowel/bladder control, bathing, dressing, feeding, grooming, toileting and psychosocial support 5. Can the patient actively participate in an intensive therapy program of at least 3 hrs of therapy per day at least 5 days per week? Potentially 6. The potential for patient to make  measurable gains while on inpatient rehab is fair 7. Anticipated functional outcomes upon discharge from inpatient rehab are ?supervision with PT, ?supervision with OT, n/a with SLP. 8. Estimated rehab length of stay to reach the above functional goals is: ? One week if needed at all 9. Does the patient have adequate social supports to accommodate these discharge functional goals? Potentially 10. Anticipated D/C setting: Home 11. Anticipated post D/C treatments: HH therapy 12. Overall Rehab/Functional Prognosis: good  RECOMMENDATIONS: This patient's condition is appropriate for continued rehabilitative care in the following setting: SNF vs HH Patient has agreed to participate in recommended program. Potentially Note that insurance prior authorization may be required for reimbursement for recommended care.  Comment: Rehab RN to follow up. I question how far he is from baseline as he admits to balance issues before. Wife needs some assistance too. Would like to see how he performs with therapy today to see if we could justify a CIR admission. Also need to find out from family how much help they can provide.   Sheridyn Canino T. Pierce Barocio, MD, FAAPMR [facility]    Lekeshia Kram T. Careena Degraffenreid, MD, FAAPMR     08/26/2012  

## 2012-08-26 NOTE — Op Note (Signed)
Tiny PFO as tested by color doppler and with injection of agitated saline Mild to mod fixed atherosclerotic plaques in aorta. Full report to follow

## 2012-08-26 NOTE — H&P (View-Only) (Signed)
Physical Medicine and Rehabilitation Consult Reason for Consult: CVA  Referring Physician: [facility]ad   HPI: Brandon Hardy is a 79 y.o. right-handed male admitted 08/23/2012 with acute left-sided weakness while mowing his lawn. MRI of the brain showed acute right MCA territory infarction. Hypokalemia 3.3 with supplement added. MRA of the head with no proximal stenosis or aneurysm. Echocardiogram with ejection fraction of 55% without emboli. Carotid Dopplers with no ICA stenosis. TEE pending. Patient did not receive TPA. Patient has a history of thrombocytopenia with platelet count 89,000 on admission. Neurology services consulted placed on aspirin therapy for CVA prophylaxis. Patient maintained on low-dose prednisone for history of arthritis. Physical occupational therapy evaluations completed 08/24/2012 with recommendations for physical medicine rehabilitation consult.   Review of Systems  Cardiovascular: Positive for leg swelling.  Gastrointestinal: Positive for constipation.  Genitourinary: Positive for frequency.  Musculoskeletal: Positive for myalgias and joint pain.  Neurological: Positive for weakness.  All other systems reviewed and are negative.   Past Medical History  Diagnosis Date  . Arthritis   . Blood transfusion   . Renal disorder   . Thrombocytopenia   . Heart murmur   . Chronic back pain   . Peripheral edema     was on Lasix but has been off for about 3months  . GERD (gastroesophageal reflux disease)     takes Prilosec daily  . GI bleed     hx of   . Constipation     takes Senna daily and eats Metamucil daily  . Urinary frequency   . History of kidney stones   . History of blood transfusion     no abnormal reaction noted  . Cataracts, bilateral     immature  . History of blood clots     left leg  . History of Clostridium difficile infection   . BPH (benign prostatic hyperplasia)   . Stroke 08/22/2012   Past Surgical History  Procedure Laterality Date  .  Fracture surgery    . Back surgery      multiple  . Neck surgery  2013    x 2/plates, screws, decreased mobility  . Left finger surgery    . Foot surgery    . Colonoscopy    . Esophagogastroduodenoscopy    . Gi bleed    . Ulnar nerve transposition  01/09/2012    Procedure: ULNAR NERVE DECOMPRESSION/TRANSPOSITION;  Surgeon: Kathryne Hitch, MD;  Location[facility];  Service: Orthopedics;  Laterality: Right;  Right ulnar nerve decompression at the elbow/cubital tunnel and transposition  . Inguinal hernia repair Right 04/23/2012    Procedure: HERNIA REPAIR INGUINAL ADULT;  Surgeon: Kandis Cocking, MD;  Location: [facility] ORS;  Service: General;  Laterality: Right;  . Insertion of mesh Right 04/23/2012    Procedure: INSERTION OF MESH;  Surgeon: Kandis Cocking, MD;  Location: [facility] ORS;  Service: General;  Laterality: Right;   History reviewed. No pertinent family history. Social History:  reports that he quit smoking about 39 years ago. His smoking use included Cigarettes. He smoked 0.00 packs per day. He has never used smokeless tobacco. He reports that he does not drink alcohol or use illicit drugs. Allergies:  Allergies  Allergen Reactions  . Chlorhexidine Rash   Medications Prior to Admission  Medication Sig Dispense Refill  . docusate sodium (COLACE) 100 MG capsule Take 100 mg by mouth 2 (two) times daily.      Marland Kitchen gabapentin (NEURONTIN) 100 MG capsule Take 100 mg by mouth  2 (two) times daily.       Marland Kitchen omeprazole (PRILOSEC) 20 MG capsule Take 20 mg by mouth daily.      . predniSONE (DELTASONE) 10 MG tablet Take 10 mg by mouth daily.      . riboflavin (VITAMIN B-2) 100 MG TABS tablet Take 100 mg by mouth daily.        Home: Home Living Family/patient expects to be discharged to:: Private residence Living Arrangements: Spouse/significant other Available Help at Discharge: Family;Available PRN/intermittently Type of Home: House Home Access: Stairs to enter [employer] of Steps:  4; 2 in garage Entrance Stairs-Rails: Left;Right;Can reach both (no rails in garage entrance) Home Layout: One level Home Equipment: Walker - 2 wheels;Cane - single point;Grab bars - toilet;Tub bench;Shower seat;Hand held shower head Additional Comments: Wife is staying with son while husband is at hospital, she has had h/o CVA. Pt reports he does not want to depend on his wife.  Lives With: Spouse  Functional History: Prior Function Vocation: Retired Functional Status:  Mobility: Bed Mobility Bed Mobility: Supine to Sit;Sitting - Scoot to Edge of Bed Supine to Sit: 4: Min assist;HOB flat Sitting - Scoot to [place] of Bed: 4: Min guard Transfers Transfers: Sit to Stand;Stand to Sit Sit to Stand: 4: Min assist;With upper extremity assist;From bed Stand to Sit: 4: Min assist;To chair/3-in-1 Ambulation/Gait Ambulation/Gait Assistance: 4: Min assist Ambulation Distance (Feet): 70 Feet Assistive device: Rolling walker Ambulation/Gait Assistance Details: mildly unsteady thoughout, mildly HP with flat foot initial contact.  needed assist managing RW. Gait Pattern: Step-through pattern Stairs: No Wheelchair Mobility Wheelchair Mobility: No  ADL: ADL Eating/Feeding: Performed;Modified independent Where Assessed - Eating/Feeding: Chair Upper Body Bathing: Minimal assistance;Simulated Where Assessed - Upper Body Bathing: Unsupported sitting Lower Body Bathing: Simulated;Moderate assistance Where Assessed - Lower Body Bathing: Supported sit to stand Upper Body Dressing: Performed;Minimal assistance Where Assessed - Upper Body Dressing: Unsupported sitting Lower Body Dressing: Performed;Maximal assistance Where Assessed - Lower Body Dressing: Supported sit to stand Toilet Transfer: Simulated;Minimal assistance Toilet Transfer Method: Sit to stand (ambulating) Acupuncturist:  (chair) Equipment Used: Gait belt;Rolling walker Transfers/Ambulation Related to ADLs: Min assist with  RW. Assist for steadying and for safe use of RW. Pt with tendency to veer left while ambulating. Frequent assist to repositiong left hand on RW (decreased proprioception and grip strength). ADL Comments: Decreased awareness of Left side during all ADL and mobility tasks.  Cognition: Cognition Overall Cognitive Status: Within Functional Limits for tasks assessed Arousal/Alertness: Awake/alert Orientation Level: Oriented X4 Attention: Selective Selective Attention: Appears intact Memory: Appears intact Awareness: Appears intact Problem Solving: Appears intact Executive Function: Reasoning;Sequencing Reasoning: Appears intact Sequencing: Appears intact Safety/Judgment: Appears intact Cognition Arousal/Alertness: Awake/alert Behavior During Therapy: WFL for tasks assessed/performed Overall Cognitive Status: Within Functional Limits for tasks assessed Area of Impairment: Following commands;Problem solving;Safety/judgement Following Commands: Follows one step commands consistently;Follows multi-step commands inconsistently Safety/Judgement: Decreased awareness of deficits;Decreased awareness of safety Problem Solving: Slow processing;Requires verbal cues  Blood pressure 159/62, pulse 50, temperature 97.8 F (36.6 C), temperature source Oral, resp. rate 18, weight 61.281 kg (135 lb 1.6 oz), SpO2 99.00%. Physical Exam  Vitals reviewed. Constitutional: He is oriented to person, place, and time.  HENT:  Head: Normocephalic.  Eyes: EOM are normal.  Neck: Normal range of motion. Neck supple. No thyromegaly present.  Cardiovascular: Regular rhythm.   Pulmonary/Chest: Effort normal and breath sounds normal. No respiratory distress.  Abdominal: Soft. Bowel sounds are normal. He exhibits no distension.  Neurological: He is alert and oriented to person, place, and time.  Follows commands. Very alert. Mild left central 7. Mild left pronator drift. Strength nearly symmetrical in all 4's at 4/5.  No gross sensory deficits.   Skin: Skin is warm and dry.  Psychiatric: He has a normal mood and affect.    Results for orders placed during the hospital encounter of 08/23/12 (from the past 24 hour(s))  OCCULT BLOOD X 1 CARD TO LAB, STOOL     Status: None   Collection Time    08/25/12  5:14 PM      Result Value Range   Fecal Occult Bld NEGATIVE  NEGATIVE  CBC     Status: Abnormal   Collection Time    08/26/12  4:45 AM      Result Value Range   WBC 5.3  4.0 - 10.5 K/uL   RBC 3.07 (*) 4.22 - 5.81 MIL/uL   Hemoglobin 10.5 (*) 13.0 - 17.0 g/dL   HCT 45.4 (*) 09.8 - 11.9 %   MCV 102.3 (*) 78.0 - 100.0 fL   MCH 34.2 (*) 26.0 - 34.0 pg   MCHC 33.4  30.0 - 36.0 g/dL   RDW 14.7  82.9 - 56.2 %   Platelets 95 (*) 150 - 400 K/uL   No results found.  Assessment/Plan: Diagnosis: right MCA infarct 1. Does the need for close, 24 hr/day medical supervision in concert with the patient's rehab needs make it unreasonable for this patient to be served in a less intensive setting? Potentially 2. Co-Morbidities requiring supervision/potential complications: GERD, premorbid gait disorder? 3. Due to bladder management, bowel management, safety, skin/wound care, disease management, medication administration and patient education, does the patient require 24 hr/day rehab nursing? Potentially 4. Does the patient require coordinated care of a physician, rehab nurse, PT (1-2 hrs/day, 5 days/week) and OT (1-2 hrs/day, 5 days/week) to address physical and functional deficits in the context of the above medical diagnosis(es)? Potentially Addressing deficits in the following areas: balance, endurance, locomotion, strength, transferring, bowel/bladder control, bathing, dressing, feeding, grooming, toileting and psychosocial support 5. Can the patient actively participate in an intensive therapy program of at least 3 hrs of therapy per day at least 5 days per week? Potentially 6. The potential for patient to make  measurable gains while on inpatient rehab is fair 7. Anticipated functional outcomes upon discharge from inpatient rehab are ?supervision with PT, ?supervision with OT, n/a with SLP. 8. Estimated rehab length of stay to reach the above functional goals is: ? One week if needed at all 9. Does the patient have adequate social supports to accommodate these discharge functional goals? Potentially 10. Anticipated D/C setting: Home 11. Anticipated post D/C treatments: HH therapy 12. Overall Rehab/Functional Prognosis: good  RECOMMENDATIONS: This patient's condition is appropriate for continued rehabilitative care in the following setting: SNF vs HH Patient has agreed to participate in recommended program. Potentially Note that insurance prior authorization may be required for reimbursement for recommended care.  Comment: Rehab RN to follow up. I question how far he is from baseline as he admits to balance issues before. Wife needs some assistance too. Would like to see how he performs with therapy today to see if we could justify a CIR admission. Also need to find out from family how much help they can provide.   Ranelle Oyster, MD, [facility]    Ranelle Oyster, MD, [place] [facility]     08/26/2012

## 2012-08-26 NOTE — Interval H&P Note (Signed)
History and Physical Interval Note:  08/26/2012 10:37 AM  Brandon Hardy  has presented today for surgery, with the diagnosis of stroke  The various methods of treatment have been discussed with the patient and family. After consideration of risks, benefits and other options for treatment, the patient has consented to  Procedure(s): TRANSESOPHAGEAL ECHOCARDIOGRAM (TEE) (N/A) as a surgical intervention .  The patient's history has been reviewed, patient examined, no change in status, stable for surgery.  I have reviewed the patient's chart and labs.  Questions were answered to the patient's satisfaction.     [name]

## 2012-08-27 ENCOUNTER — Encounter (HOSPITAL_COMMUNITY): Payer: Self-pay | Admitting: Internal Medicine

## 2012-08-27 NOTE — Progress Notes (Signed)
Physical Therapy Treatment Patient Details Name: AITHAN FARRELLY MRN: [id number] DOB: Aug 10, 1927 Today's Date: 08/27/2012 Time: 7829-5621 PT Time Calculation (min): 17 min  PT Assessment / Plan / Recommendation  History of Present Illness  LIAN TANORI is a 79 y.o. male with medical problems as listed below who presents with the above complaints. He states that he was out mowing his lawn yesterday and went inside to use the bathroom and noticed that he was having difficulty unbuttoning his clothes. He Also  felt weak weak overall and more so his L. Side. His symptoms were persisting today and his son noted that he had a facial droop and weakness on his left side, so he was brought to ED. Pt reports he has had numbness in his hands due to"neck problems' for which he is followed by Dr Noel Gerold.   PT Comments   Pt continues to need direction to attend to left side, however not always open to cues/directions. Most of time pt presents a reason/escuse for his lapses and denies there is an issue. Continues to remain appropriate for CIR.    Follow Up Recommendations  CIR;Supervision for mobility/OOB     Equipment Recommendations  None recommended by PT    Recommendations for Other Services Rehab consult  Frequency Min 3X/week   Progress towards PT Goals Progress towards PT goals: Progressing toward goals  Plan Current plan remains appropriate    Precautions / Restrictions Precautions Precaution Comments: cues to attend to left side     Mobility  Bed Mobility Bed Mobility: Not assessed Transfers Sit to Stand: 4: Min assist;From chair/3-in-1;With upper extremity assist;With armrests Stand to Sit: 4: Min guard;With upper extremity assist;To chair/3-in-1;With armrests Details for Transfer Assistance: cues for hand placement and ant weight shift to assist with standing Ambulation/Gait Ambulation/Gait Assistance: 4: Min assist Ambulation Distance (Feet): 100 Feet Assistive device: Rolling  walker Ambulation/Gait Assistance Details: pt needed max cues for posture, walker position and to attent to left for barrier/obstacle avoidance: hit barriers/obstacles x6 during this gait trial and when cued to negotiate said object/barrier pt presented a "reason" for running into it each time Gait Pattern: Step-through pattern;Decreased stride length;Shuffle;Trunk flexed;Narrow base of support      PT Goals (current goals can now be found in the care plan section) Acute Rehab PT Goals Patient Stated Goal: I want to go home as soon as I can, my wife and I need each other PT Goal Formulation: With patient Time For Goal Achievement: 09/07/12 Potential to Achieve Goals: Good  Visit Information  Last PT Received On: 08/27/12 Assistance Needed: +1 History of Present Illness:  GEAROLD WAINER is a 79 y.o. male with medical problems as listed below who presents with the above complaints. He states that he was out mowing his lawn yesterday and went inside to use the bathroom and noticed that he was having difficulty unbuttoning his clothes. He Also  felt weak weak overall and more so his L. Side. His symptoms were persisting today and his son noted that he had a facial droop and weakness on his left side, so he was brought to ED. Pt reports he has had numbness in his hands due to"neck problems' for which he is followed by Dr Noel Gerold.    Subjective Data  Patient Stated Goal: I want to go home as soon as I can, my wife and I need each other   Cognition  Cognition Arousal/Alertness: Awake/alert Behavior During Therapy: WFL for tasks assessed/performed Overall Cognitive Status:  Impaired/Different from baseline Area of Impairment: Memory;Following commands;Safety/judgement;Problem solving Memory: Decreased recall of precautions Following Commands: Follows one step commands consistently Safety/Judgement: Decreased awareness of safety;Decreased awareness of deficits Problem Solving: Slow processing     Balance     End of Session PT - End of Session Equipment Utilized During Treatment: Gait belt Activity Tolerance: Patient tolerated treatment well Patient left: in chair;with call bell/phone within reach;with chair alarm set;with family/visitor present Nurse Communication: Mobility status   GP     Sallyanne Kuster 08/27/2012, 4:44 PM  Sallyanne Kuster, PTA Office- [phone]

## 2012-08-27 NOTE — Progress Notes (Signed)
I met with patient at bedside. He prefers discharge home with [facility] and assist of his family. He says he is not refusing inpt rehab admission, but feels therapy has only worked with him twice and that is not enough.He does not give me permission to contact his son, Iantha Fallen. I will follow up with pt after he speaks with his son and discussed with his physician. [phone]

## 2012-08-27 NOTE — Progress Notes (Signed)
Stroke Team Progress Note  HISTORY Brandon Hardy is an 79 y.o. male who was out in his yard doing yard work on Thursday 08/22/2012 when he noted his left arm was weaker than usual. When he was on his riding lawn mower he was unable to engage the blades with his left hand. Patient was brought to ED 08/23/2012 at 0804 due to son noting left arm and leg weakness along with left facial droop. Patient does have history of GI bleed due to ulcer and esophageal bleed (secondary to ETOH) along with thrombocytopenia (PLT 89 on admission). Patient was not a tPA candidate due to being out of window. He was admitted for further evaluation.  SUBJECTIVE No family at the bedside. Pt deciding between CIR and HH.  OBJECTIVE Most recent Vital Signs: Filed Vitals:   08/26/12 2149 08/27/12 0200 08/27/12 0244 08/27/12 0500  BP: 129/59 127/42  130/47  Pulse: 50 52  50  Temp: 98.1 F (36.7 C) 97.8 F (36.6 C)  97.6 F (36.4 C)  TempSrc: Oral   Oral  Resp: 20 18  20   Height:   5\' 7"  (1.702 m)   Weight:   61.553 kg (135 lb 11.2 oz)   SpO2: 100% 95%  97%   CBG (last 3)  No results found for this basename: GLUCAP,  in the last 72 hours  IV Fluid Intake:      MEDICATIONS  . aspirin EC  81 mg Oral Daily  . docusate sodium  100 mg Oral BID  . gabapentin  100 mg Oral BID  . pantoprazole  40 mg Oral Daily  . predniSONE  20 mg Oral Q breakfast   PRN:  acetaminophen, acetaminophen, acetaminophen-codeine, alum & mag hydroxide-simeth, magnesium hydroxide, ondansetron (ZOFRAN) IV, senna-docusate  Diet:  Cardiac thin liquids Activity:  Up with assistance DVT Prophylaxis:  SCDs  CLINICALLY SIGNIFICANT STUDIES Basic Metabolic Panel:   Recent Labs Lab 08/24/12 0430 08/25/12 0500  NA 140 139  K 5.1 4.7  CL 110 109  CO2 25 24  GLUCOSE 85 90  BUN 18 19  CREATININE 0.91 1.01  CALCIUM 8.6 8.7   Liver Function Tests:   Recent Labs Lab 08/23/12 0903  AST 26  ALT 13  ALKPHOS 67  BILITOT 1.3*  PROT  5.9*  ALBUMIN 2.8*   CBC:   Recent Labs Lab 08/23/12 0903  08/25/12 0500 08/26/12 0445  WBC 5.7  < > 5.7 5.3  NEUTROABS 4.0  --   --   --   HGB 10.7*  < > 10.5* 10.5*  HCT 31.6*  < > 31.4* 31.4*  MCV 101.0*  < > 101.6* 102.3*  PLT 83*  < > 88* 95*  < > = values in this interval not displayed. Coagulation:   Recent Labs Lab 08/23/12 0903  LABPROT 16.0*  INR 1.31   Cardiac Enzymes:   Recent Labs Lab 08/23/12 0907  TROPONINI <0.30   Urinalysis:   Recent Labs Lab 08/23/12 1410  COLORURINE YELLOW  LABSPEC 1.014  PHURINE 7.5  GLUCOSEU NEGATIVE  HGBUR NEGATIVE  BILIRUBINUR NEGATIVE  KETONESUR NEGATIVE  PROTEINUR NEGATIVE  UROBILINOGEN 1.0  NITRITE NEGATIVE  LEUKOCYTESUR NEGATIVE   Lipid Panel    Component Value Date/Time   CHOL 116 08/24/2012 0430   TRIG 46 08/24/2012 0430   HDL 56 08/24/2012 0430   CHOLHDL 2.1 08/24/2012 0430   VLDL 9 08/24/2012 0430   LDLCALC 51 08/24/2012 0430   HgbA1C  Lab Results  Component Value  Date   HGBA1C 4.6 08/24/2012    Urine Drug Screen:     Component Value Date/Time   LABOPIA POSITIVE* 08/23/2012 1410   COCAINSCRNUR NONE DETECTED 08/23/2012 1410   LABBENZ NONE DETECTED 08/23/2012 1410   AMPHETMU NONE DETECTED 08/23/2012 1410   THCU NONE DETECTED 08/23/2012 1410   LABBARB NONE DETECTED 08/23/2012 1410    Alcohol Level:   Recent Labs Lab 08/23/12 0903  ETH <11    Dg Chest 2 View 08/23/2012   No acute cardiopulmonary abnormality seen.    Ct Head Wo Contrast 08/23/2012 No acute abnormality.    Mr Brain Wo Contrast 08/23/2012 Acute right MCA territory infarction. This involves the right posterior frontal cortex, inferior frontal operculum, and right insular ribbon.  The distribution is consistent with the patient's history of left hemiparesis.  Atrophy and small vessel disease.    MRA HEAD   No proximal intracranial stenosis or aneurysm.  Severe narrowing of a right MCA M3 branch likely accounts for the observed  intracranial infarction.   2D Echocardiogram  ejection fraction 55%. No cardiac source of embolism was identified.  Carotid Doppler  Preliminary findings: 1-39% ICA stenosis bilaterally.  TEE 08/26/2012 Tiny PFO as tested by color doppler and with injection of agitated saline Mild to mod fixed atherosclerotic plaques in aorta.  LE venous dopplers  08/26/2012  Bilateral: No evidence of DVT, superficial thrombosis, or Baker's Cyst.  CXR  No acute cardiopulmonary abnormality seen.  EKG  sinus rhythm rate 53 beats per minute  Therapy Recommendations  CIR  Physical Exam   Neurologic Examination:  Blood pressure 122/46, pulse 50, temperature 98.6 F (37 C), temperature source Oral, resp. rate 12, SpO2 100.00%.  Mental Status:  Alert, oriented, thought content appropriate. Speech fluent without evidence of aphasia. Able to follow 3 step commands without difficulty.  Cranial Nerves:  II: Discs flat bilaterally; Visual fields grossly normal, pupils equal, round, reactive to light and accommodation  III,IV, VI: ptosis not present, extra-ocular motions intact bilaterally  V,VII: smile asymmetric on the left, facial light touch sensation decreased on the left  VIII: hearing normal bilaterally  IX,X: gag reflex present  XI: bilateral shoulder shrug  XII: midline tongue extension  Motor:  Right : Upper extremity 5/5 Left: Upper extremity 4/5 (drift)  Lower extremity 5/5 Lower extremity 4/5 (drift)  Tone and bulk:normal tone throughout; no atrophy noted  Sensory: Pinprick and light touch decreased on the left arm and leg with neglect of arm and leg to DSS  Deep Tendon Reflexes:  Right: Upper Extremity Left: Upper extremity  biceps (C-5 to C-6) 2/4 biceps (C-5 to C-6) 2/4  tricep (C7) 2/4 triceps (C7) 2/4  Brachioradialis (C6) 2/4 Brachioradialis (C6) 2/4  Lower Extremity Lower Extremity  quadriceps (L-2 to L-4) 2/4 quadriceps (L-2 to L-4) 2/4  Achilles (S1) 1/4 Achilles (S1) 1/4  Plantars:   Right: downgoing Left: downgoing  Cerebellar:  normal finger-to-nose on right with dysmetria on the left , normal heel-to-shin test on the right with dysmetria on the left due to weakness  CV: pulses palpable throughout    ASSESSMENT [name] is a 79 y.o. male presenting with left hemiparesis and left facial droop. TPA was not given as the patient was outside the therapeutic window.  Imaging confirms a right middle cerebral artery territory  Branch infarct, likely embolic causing R MCA M3 occlusion which led to the infarct. On no antithrombotics prior to admission. Now on low dose aspirin.  Patient with  resultant left hemiparesis. Work up completed.  PFO (patent foramen ovale) likely an incidental finding.   Anemia  Thrombocytopenia, chronic, PLT 95, not on lovenox for VTE, low dose aspirin for stroke, most likely not a long term anticoagulation candidate if needed  Urine drug screen positive for opiates  EtOH history, MCV slightly elevated  History of GI bleed, Hgb remains stable at 10.5  Chronic back pain, arthritis, on Prednisone therapy  Hospital day # 4  TREATMENT/PLAN  Continue aspirin 81 mg daily for secondary stroke prevention.  CIR vs Home health. Medically ready for discharge to appropriate venu No further stroke workup indicated. Patient has a 10-15% risk of having another stroke over the next year, the highest risk is within 2 weeks of the most recent stroke/TIA (risk of having a stroke following a stroke or TIA is the same). Ongoing risk factor control by Primary Care Physician Stroke Service will sign off. Please call should any needs arise. Follow up with Dr. Pearlean Brownie, Stroke Clinic, in 2 months.   Annie Main, MSN, RN, ANVP-BC, ANP-BC, [facility] Pager: [phone] 08/27/2012 10:37 AM  I have personally obtained a history, examined the patient, evaluated imaging results, and formulated the assessment and plan of care. I agree  with the above.  Delia Heady, MD

## 2012-08-27 NOTE — Progress Notes (Signed)
Patient told me that he wants to go to CIR, i paged Dr Donna Bernard with this information  Minor, Yvette Rack

## 2012-08-27 NOTE — Progress Notes (Addendum)
[facility] PROGRESS NOTE  Brandon Hardy NWG:[id number] DOB: Sep 15, 1927 DOA: 08/23/2012 PCP: Gaye Alken, MD Brief Narrative Pt is an 79 y.o. male with medical problems as listed below who presents with the above complaints. He states that he was out mowing his lawn yesterday and went inside to use the bathroom and noticed that he was having difficulty unbuttoning his clothes. He Also felt weak weak overall and more so his L. Side. His symptoms were persisting today and his son noted that he had a facial droop and weakness on his left side, so he was brought to ED. Pt reports he has had numbness in his hands due to"neck problems' for which he is followed by Dr Noel Gerold. He was seen in ED head CT neg, K 3.3, neuro consulted and admission to [facility] requested. He denies dysphagia, slurred speech, headaches and no visual changes.  Assessment/Plan: Acute left-sided muscle weakness/Acute R.MCA infarct -CT with no acute abnl  -obtain MRI with R.MCA infarct  -continue ASA  -appreciate Neuro assistance>>TEE with small PFO  - doppler of LE -neg for DVT -PT/OT>>rehab>>Pt now agrees to CIR, will await CIR coordinator follow up GERD (gastroesophageal reflux disease)  -continue PPI  Hypokalemia  -resolved, recheck in am  Thrombocytopenia, chronic  -no gross bleeding  -avoiding lovenox, plt increasing, plt ct 95 on8/25 .follow on ASA H/O Chronic back pain  -followed by Dr Noel Gerold  -placed prednisone to 20mg  on admit , ,will decrease back to his outpt chronic dose of 10mg    -CONITNUE tylenol #3 Remote H/O alcohol abuse  States he quit alcohol 14yrs ago  H/O GI bleed  -hgb stable, follow recheck Constipation Resolved, mom prn PFO -Per TEE -per Neuro incidental -doppler of LE to eval for DVT- neg, follow  Code Status: full Family Communication: son at bedside  Disposition Plan: await CIR,coordinator to follow back up.   Consultants:  Neuro  CARDS- for  TEE  Procedures:  Echo Study Conclusions  - Left ventricle: The cavity size was normal. Wall thickness was increased in a pattern of mild LVH. The estimated ejection fraction was 55%. Wall motion was normal; there were no regional wall motion abnormalities. - Aortic valve: Mild aortic stenosis. Mean gradient: 14mm Hg (S). Peak gradient: 26mm Hg (S). - Mitral valve: Mild regurgitation. - Left atrium: The atrium was mildly dilated. - Right ventricle: The cavity size was mildly dilated. Systolic function was mildly reduced. - Right atrium: The atrium was mildly dilated. - Impressions: No cardiac source of embolism was identified, but cannot be ruled out on the basis of this examination.  TEE Study Conclusions  - Left atrium: No evidence of thrombus in the atrial cavity or appendage. - Atrial septum: Very small PFO as tested by color doppler and with injection of agitated saline with few bubbles seen in L sided chambers.  LE doppler Summary:  - No evidence of deep vein or superficial thrombosis involving the right lower extremity and left lower extremity. - No evidence of Baker's cyst on the right or left. Other specific details can be found in the table(s) above. Prepared and Electronically Authenticated by    Antibiotics:  none  HPI/Subjective:  doing well today, states he needs to talk with family befor deciding on going to CIR..  Objective: Filed Vitals:   08/27/12 1000  BP: 165/51  Pulse: 60  Temp:   Resp: 20    Intake/Output Summary (Last 24 hours) at 08/27/12 1338 Last data filed at 08/27/12 5784  Gross per  24 hour  Intake    120 ml  Output   1720 ml  Net  -1600 ml   Filed Weights   08/23/12 1308 08/27/12 0244  Weight: 61.281 kg (135 lb 1.6 oz) 61.553 kg (135 lb 11.2 oz)      Exam:  General: alert & oriented x 3 In NAD Cardiovascular: RRR, nl S1 s2 Respiratory: CTAB Abdomen: soft +BS NT/ND, no masses palpable Extremities: No cyanosis and  no edema             Neuro: Facial assymetry LUE Strength 4/5, LLE strength 4/5 and R. Sided strength 5/5     Data Reviewed: Basic Metabolic Panel:  Recent Labs Lab 08/23/12 0903 08/24/12 0430 08/25/12 0500  NA 144 140 139  K 3.3* 5.1 4.7  CL 110 110 109  CO2 27 25 24   GLUCOSE 97 85 90  BUN 17 18 19   CREATININE 1.11 0.91 1.01  CALCIUM 8.9 8.6 8.7   Liver Function Tests:  Recent Labs Lab 08/23/12 0903  AST 26  ALT 13  ALKPHOS 67  BILITOT 1.3*  PROT 5.9*  ALBUMIN 2.8*   No results found for this basename: LIPASE, AMYLASE,  in the last 168 hours No results found for this basename: AMMONIA,  in the last 168 hours CBC:  Recent Labs Lab 08/23/12 0903 08/24/12 0430 08/25/12 0500 08/26/12 0445  WBC 5.7 5.5 5.7 5.3  NEUTROABS 4.0  --   --   --   HGB 10.7* 10.2* 10.5* 10.5*  HCT 31.6* 30.8* 31.4* 31.4*  MCV 101.0* 102.0* 101.6* 102.3*  PLT 83* 81* 88* 95*   Cardiac Enzymes:  Recent Labs Lab 08/23/12 0907  TROPONINI <0.30   BNP (last 3 results) No results found for this basename: PROBNP,  in the last 8760 hours CBG:  Recent Labs Lab 08/23/12 0927  GLUCAP 85    No results found for this or any previous visit (from the past 240 hour(s)).   Studies: No results found.  Scheduled Meds: . aspirin EC  81 mg Oral Daily  . docusate sodium  100 mg Oral BID  . gabapentin  100 mg Oral BID  . pantoprazole  40 mg Oral Daily  . predniSONE  20 mg Oral Q breakfast   Continuous Infusions:    Active Problems:   GERD (gastroesophageal reflux disease)   Acute left-sided muscle weakness   Hypokalemia   CVA (cerebral infarction)    Time spent: 25    [facility]  [facility] Pager [phone]. If 7PM-7AM, please contact night-coverage at [email or web address], password [facility] 08/27/2012, 1:38 PM  LOS: 4 days

## 2012-08-28 ENCOUNTER — Encounter (HOSPITAL_COMMUNITY): Payer: Self-pay | Admitting: Internal Medicine

## 2012-08-28 ENCOUNTER — Inpatient Hospital Stay (HOSPITAL_COMMUNITY)
Admission: RE | Admit: 2012-08-28 | Discharge: 2012-09-05 | DRG: 945 | Disposition: A | Discharge status: Home Health | Payer: Medicare Other | Source: Intra-hospital | Attending: Physical Medicine & Rehabilitation | Admitting: Physical Medicine & Rehabilitation

## 2012-08-28 DIAGNOSIS — I6330 Cerebral infarction due to thrombosis of unspecified cerebral artery: Secondary | ICD-10-CM

## 2012-08-28 DIAGNOSIS — IMO0002 Reserved for concepts with insufficient information to code with codable children: Secondary | ICD-10-CM

## 2012-08-28 DIAGNOSIS — R29810 Facial weakness: Secondary | ICD-10-CM

## 2012-08-28 DIAGNOSIS — K5900 Constipation, unspecified: Secondary | ICD-10-CM | POA: Diagnosis not present

## 2012-08-28 DIAGNOSIS — R471 Dysarthria and anarthria: Secondary | ICD-10-CM

## 2012-08-28 DIAGNOSIS — F1010 Alcohol abuse, uncomplicated: Secondary | ICD-10-CM | POA: Diagnosis present

## 2012-08-28 DIAGNOSIS — G8929 Other chronic pain: Secondary | ICD-10-CM | POA: Diagnosis not present

## 2012-08-28 DIAGNOSIS — Z87442 Personal history of urinary calculi: Secondary | ICD-10-CM

## 2012-08-28 DIAGNOSIS — Z8673 Personal history of transient ischemic attack (TIA), and cerebral infarction without residual deficits: Secondary | ICD-10-CM

## 2012-08-28 DIAGNOSIS — R269 Unspecified abnormalities of gait and mobility: Secondary | ICD-10-CM

## 2012-08-28 DIAGNOSIS — Z79899 Other long term (current) drug therapy: Secondary | ICD-10-CM | POA: Diagnosis not present

## 2012-08-28 DIAGNOSIS — Z7982 Long term (current) use of aspirin: Secondary | ICD-10-CM | POA: Diagnosis not present

## 2012-08-28 DIAGNOSIS — Z87891 Personal history of nicotine dependence: Secondary | ICD-10-CM | POA: Diagnosis not present

## 2012-08-28 DIAGNOSIS — D696 Thrombocytopenia, unspecified: Secondary | ICD-10-CM

## 2012-08-28 DIAGNOSIS — K219 Gastro-esophageal reflux disease without esophagitis: Secondary | ICD-10-CM | POA: Diagnosis present

## 2012-08-28 DIAGNOSIS — R29898 Other symptoms and signs involving the musculoskeletal system: Secondary | ICD-10-CM | POA: Diagnosis not present

## 2012-08-28 DIAGNOSIS — Q211 Atrial septal defect: Secondary | ICD-10-CM

## 2012-08-28 DIAGNOSIS — Z5189 Encounter for other specified aftercare: Secondary | ICD-10-CM | POA: Diagnosis present

## 2012-08-28 DIAGNOSIS — Q2111 Secundum atrial septal defect: Secondary | ICD-10-CM

## 2012-08-28 DIAGNOSIS — N400 Enlarged prostate without lower urinary tract symptoms: Secondary | ICD-10-CM

## 2012-08-28 DIAGNOSIS — E876 Hypokalemia: Secondary | ICD-10-CM | POA: Diagnosis not present

## 2012-08-28 DIAGNOSIS — M961 Postlaminectomy syndrome, not elsewhere classified: Secondary | ICD-10-CM

## 2012-08-28 DIAGNOSIS — I639 Cerebral infarction, unspecified: Secondary | ICD-10-CM

## 2012-08-28 DIAGNOSIS — Q2112 Patent foramen ovale: Secondary | ICD-10-CM

## 2012-08-28 DIAGNOSIS — G459 Transient cerebral ischemic attack, unspecified: Secondary | ICD-10-CM | POA: Diagnosis present

## 2012-08-28 DIAGNOSIS — R64 Cachexia: Secondary | ICD-10-CM

## 2012-08-28 DIAGNOSIS — M6281 Muscle weakness (generalized): Secondary | ICD-10-CM

## 2012-08-28 HISTORY — DX: Atrial septal defect: Q21.1

## 2012-08-28 HISTORY — DX: Patent foramen ovale: Q21.12

## 2012-08-28 HISTORY — DX: Thrombocytopenia, unspecified: D69.6

## 2012-08-28 LAB — CBC
HCT: 31.6 % — ABNORMAL LOW (ref 39.0–52.0)
Hemoglobin: 10.7 g/dL — ABNORMAL LOW (ref 13.0–17.0)
MCHC: 33.9 g/dL (ref 30.0–36.0)
MCV: 101 fL — ABNORMAL HIGH (ref 78.0–100.0)
RDW: 13.7 % (ref 11.5–15.5)

## 2012-08-28 LAB — BASIC METABOLIC PANEL
BUN: 21 mg/dL (ref 6–23)
Creatinine, Ser: 1.04 mg/dL (ref 0.50–1.35)
GFR calc Af Amer: 75 mL/min — ABNORMAL LOW (ref >90)
GFR calc non Af Amer: 65 mL/min — ABNORMAL LOW (ref >90)
Glucose, Bld: 82 mg/dL (ref 70–99)

## 2012-08-28 MED ORDER — PROCHLORPERAZINE MALEATE 5 MG PO TABS
5.0000 mg | ORAL_TABLET | Freq: Four times a day (QID) | ORAL | Status: DC | PRN
  Filled 2012-08-28: qty 2

## 2012-08-28 MED ORDER — GUAIFENESIN-DM 100-10 MG/5ML PO SYRP: 5.0000 mL | ORAL_SOLUTION | Freq: Four times a day (QID) | ORAL | Status: DC | PRN

## 2012-08-28 MED ORDER — FLEET ENEMA 7-19 GM/118ML RE ENEM
1.0000 | ENEMA | Freq: Once | RECTAL | Status: AC | PRN
  Filled 2012-08-28: qty 1

## 2012-08-28 MED ORDER — TRAMADOL HCL 50 MG PO TABS: 50.0000 mg | ORAL_TABLET | Freq: Four times a day (QID) | ORAL | Status: DC | PRN

## 2012-08-28 MED ORDER — PREDNISONE 20 MG PO TABS
20.0000 mg | ORAL_TABLET | Freq: Every day | ORAL | Status: DC
  Administered 2012-08-29 – 2012-09-05 (×8): 20 mg via ORAL
  Filled 2012-08-28 (×9): qty 1

## 2012-08-28 MED ORDER — DOCUSATE SODIUM 100 MG PO CAPS
100.0000 mg | ORAL_CAPSULE | Freq: Two times a day (BID) | ORAL | Status: DC
  Administered 2012-08-28 – 2012-09-03 (×12): 100 mg via ORAL
  Filled 2012-08-28 (×15): qty 1

## 2012-08-28 MED ORDER — PANTOPRAZOLE SODIUM 40 MG PO TBEC: 40.0000 mg | DELAYED_RELEASE_TABLET | Freq: Every day | ORAL | Status: DC

## 2012-08-28 MED ORDER — NON FORMULARY: 20.0000 mg | Freq: Every day | Status: DC

## 2012-08-28 MED ORDER — ACETAMINOPHEN-CODEINE #3 300-30 MG PO TABS
1.0000 | ORAL_TABLET | Freq: Four times a day (QID) | ORAL | Status: DC | PRN
  Administered 2012-08-28 – 2012-09-05 (×19): 1 via ORAL
  Filled 2012-08-28 (×20): qty 1

## 2012-08-28 MED ORDER — PROCHLORPERAZINE EDISYLATE 5 MG/ML IJ SOLN
5.0000 mg | Freq: Four times a day (QID) | INTRAMUSCULAR | Status: DC | PRN
  Filled 2012-08-28: qty 2

## 2012-08-28 MED ORDER — BISACODYL 10 MG RE SUPP: 10.0000 mg | Freq: Every day | RECTAL | Status: DC | PRN

## 2012-08-28 MED ORDER — DIPHENHYDRAMINE HCL 12.5 MG/5ML PO ELIX
12.5000 mg | ORAL_SOLUTION | Freq: Four times a day (QID) | ORAL | Status: DC | PRN
  Filled 2012-08-28: qty 10

## 2012-08-28 MED ORDER — ASPIRIN EC 81 MG PO TBEC
81.0000 mg | DELAYED_RELEASE_TABLET | Freq: Every day | ORAL | Status: DC
  Administered 2012-08-29 – 2012-09-05 (×8): 81 mg via ORAL
  Filled 2012-08-28 (×9): qty 1

## 2012-08-28 MED ORDER — OMEPRAZOLE 20 MG PO CPDR
20.0000 mg | DELAYED_RELEASE_CAPSULE | Freq: Every day | ORAL | Status: DC
  Administered 2012-08-29 – 2012-09-05 (×8): 20 mg via ORAL
  Filled 2012-08-28 (×9): qty 1

## 2012-08-28 MED ORDER — GABAPENTIN 100 MG PO CAPS
100.0000 mg | ORAL_CAPSULE | Freq: Two times a day (BID) | ORAL | Status: DC
  Administered 2012-08-28 – 2012-09-05 (×16): 100 mg via ORAL
  Filled 2012-08-28 (×18): qty 1

## 2012-08-28 MED ORDER — ALUM & MAG HYDROXIDE-SIMETH 200-200-20 MG/5ML PO SUSP
15.0000 mL | Freq: Four times a day (QID) | ORAL | Status: DC | PRN
  Administered 2012-08-29: 15 mL via ORAL
  Filled 2012-08-28: qty 30

## 2012-08-28 MED ORDER — TRAZODONE HCL 50 MG PO TABS: 25.0000 mg | ORAL_TABLET | Freq: Every evening | ORAL | Status: DC | PRN

## 2012-08-28 MED ORDER — SENNOSIDES-DOCUSATE SODIUM 8.6-50 MG PO TABS
1.0000 | ORAL_TABLET | Freq: Every evening | ORAL | Status: DC | PRN
  Filled 2012-08-28: qty 1

## 2012-08-28 MED ORDER — PROCHLORPERAZINE 25 MG RE SUPP
12.5000 mg | Freq: Four times a day (QID) | RECTAL | Status: DC | PRN
  Filled 2012-08-28: qty 1

## 2012-08-28 NOTE — PMR Pre-admission (Signed)
PMR Admission Coordinator Pre-Admission Assessment  Patient: Brandon Hardy is an 79 y.o., male MRN: [id number] DOB: 1927-11-24 Height: 5\' 7"  (170.2 cm) Weight: 61.553 kg (135 lb 11.2 oz)              Insurance Information HMO: yes    PPO:      PCP:      IPA:      80/20:      OTHER: medicare replacement policy PRIMARY: [employer] Medicare      Policy#: [id number]      Subscriber: pt CM Name: Genelle Bal      Phone#: [phone]     Fax#: [phone] Pre-Cert#: [id number]      Employer: retired. Onsite reviewer to follow, Kathleene Hazel at [phone] Benefits:  Phone #: [phone]     Name: 08/27/12 Eff. Date: 01/03/11     Deduct: none      Out of Pocket Max: $4900      Life Max: none CIR: $295 per day days 1 thru 5      SNF: $25 per day days 1 thru 20, $152 per day days 21 thru 49, no copay days 50 plus Outpatient: $45 per visit     Co-Pay: no visit limit Home Health: 100%      Co-Pay: no visit limit DME: 80%     Co-Pay: 20% Providers: in network  SECONDARY: none        Emergency Contact Information Contact Information   Name Relation Home Work Mobile   [place] Son   [phone]   [place], [place] [phone]  [phone]     Current Medical History  Patient Admitting Diagnosis: right MCA CVA  History of Present Illness:Brandon Hardy is a 79 y.o. right-handed male admitted 08/23/2012 with acute left-sided weakness while mowing his lawn. MRI of the brain showed acute right MCA territory infarction. Hypokalemia 3.3 with supplement added. MRA of the head with no proximal stenosis or aneurysm. Echocardiogram with ejection fraction of 55% without emboli. Carotid Dopplers with no ICA stenosis. TEE with tiny PFO. Felt to be incidental. Lower extremity dopplers negative for DVT. Patient did not receive TPA. Patient has a history of thrombocytopenia with platelet count 89,000 on admission. Neurology services consulted placed on aspirin therapy for CVA prophylaxis. Patient maintained  on low-dose prednisone for history of arthritis   .NIH Total: 3    Past Medical History  Past Medical History  Diagnosis Date  . Arthritis   . Blood transfusion   . Renal disorder   . Thrombocytopenia   . Heart murmur   . Chronic back pain   . Peripheral edema     was on Lasix but has been off for about 3months  . GERD (gastroesophageal reflux disease)     takes Prilosec daily  . GI bleed     hx of   . Constipation     takes Senna daily and eats Metamucil daily  . Urinary frequency   . History of kidney stones   . History of blood transfusion     no abnormal reaction noted  . Cataracts, bilateral     immature  . History of blood clots     left leg  . History of Clostridium difficile infection   . BPH (benign prostatic hyperplasia)   . Stroke 08/22/2012  . Esophageal varices with bleeding approx 20 years ago    per pt history  . Thrombocytopenia, unspecified 08/28/2012  . PFO (patent foramen ovale) 08/28/2012  Per TEE 08/2012    Family History  family history is not on file.  Prior Rehab/Hospitalizations: [facility] for 100 days in the past 2 years  Current Medications  Current facility-administered medications:acetaminophen (TYLENOL) suppository 650 mg, 650 mg, Rectal, Q4H PRN, Adeline C Viyuoh, MD;  acetaminophen (TYLENOL) tablet 650 mg, 650 mg, Oral, Q4H PRN, Adeline C Viyuoh, MD;  acetaminophen-codeine (TYLENOL #3) 300-30 MG per tablet 1 tablet, 1 tablet, Oral, Q6H PRN, Kela Millin, MD, 1 tablet at 08/27/12 2046 alum & mag hydroxide-simeth (MAALOX/MYLANTA) 200-200-20 MG/5ML suspension 15 mL, 15 mL, Oral, Q6H PRN, Elby Showers, MD, 15 mL at 08/27/12 1325;  aspirin EC tablet 81 mg, 81 mg, Oral, Daily, David L Rinehuls, PA-C, 81 mg at 08/28/12 1042;  docusate sodium (COLACE) capsule 100 mg, 100 mg, Oral, BID, Adeline C Viyuoh, MD, 100 mg at 08/28/12 1042 gabapentin (NEURONTIN) capsule 100 mg, 100 mg, Oral, BID, Adeline C Viyuoh, MD, 100 mg at 08/28/12 1042;   magnesium hydroxide (MILK OF MAGNESIA) suspension 30 mL, 30 mL, Oral, Daily PRN, Adeline C Viyuoh, MD, 30 mL at 08/24/12 0955;  ondansetron (ZOFRAN) injection 4 mg, 4 mg, Intravenous, Q6H PRN, Adeline C Viyuoh, MD;  pantoprazole (PROTONIX) EC tablet 40 mg, 40 mg, Oral, Daily, Adeline C Viyuoh, MD, 40 mg at 08/28/12 1042 predniSONE (DELTASONE) tablet 20 mg, 20 mg, Oral, Q breakfast, Adeline C Viyuoh, MD, 20 mg at 08/28/12 0754;  senna-docusate (Senokot-S) tablet 1 tablet, 1 tablet, Oral, QHS PRN, Kela Millin, MD  Patients Current Diet: Cardiac  Precautions / Restrictions Precautions Precautions: Fall Precaution Comments: cues to attend to left side  Restrictions Weight Bearing Restrictions: No   Prior Activity Level Community (5-7x/wk): patient out mowiong the grass pta, he states very active  Journalist, 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 Mostly uses cane. ADL's / Homemaking Assistance Needed: Reports he occasionally needs to assist wife and she sometimes gives him light assistance.  Comments: patient uses shower  Current Functional Level Cognition  Arousal/Alertness: Awake/alert Overall Cognitive Status: Impaired/Different from baseline Orientation Level: Oriented X4 Following Commands: Follows one step commands consistently Safety/Judgement: Decreased awareness of safety;Decreased awareness of deficits Attention: Selective Selective Attention: Appears intact Memory: Appears intact Awareness: Appears intact Problem Solving: Appears intact Executive Function: Reasoning;Sequencing Reasoning: Appears intact Sequencing:  Appears intact Safety/Judgment: Appears intact    Extremity Assessment (includes Sensation/Coordination)          ADLs  Eating/Feeding: Performed;Modified independent Where Assessed - Eating/Feeding: Chair Upper Body Bathing: Minimal assistance;Simulated Where Assessed - Upper Body Bathing: Unsupported sitting Lower Body Bathing: Simulated;Moderate assistance Where Assessed - Lower Body Bathing: Supported sit to stand Upper Body Dressing: Performed;Minimal assistance Where Assessed - Upper Body Dressing: Unsupported sitting Lower Body Dressing: Performed;Maximal assistance Where Assessed - Lower Body Dressing: Supported sit to stand Toilet Transfer: Hydrographic surveyor Method: Sit to Barista: Raised toilet seat with arms (or 3-in-1 over toilet) Toileting - Clothing Manipulation and Hygiene: Min guard Where Assessed - Toileting Clothing Manipulation and Hygiene: Sit to stand from 3-in-1 or toilet Equipment Used: Rolling walker;Cane Transfers/Ambulation Related to ADLs: Pt ambulating with RW and asking for cane use. pt provided cane with poor return demo. Pt reports "  i was using a cane before I came up here" Pt reports wife uses a cane sometimes but her balance is good. pt walking into door ways, doors, therapist, bed and cords on left side during transfers. When therapist gives verbal cues / feedback pt states "yall got to many cords in here. You stuck your foot out in front of me on purpose" Ot standing to the left of patient as PT entered room with patient. Pt progressed in the direction of OT . OT remained in one spot and pt rolled over OT left foot. Pt given v/c you are running over my foot pt proceed to attempt to proceed forward. Pt with lack of awarenss to left side inattention. Pt with incr safety errors with RW with fatigue. Pt insist on a condom cath but reports he will use a urinal at home. When encouraged to use urinal at hospital he reports deficits  holding the urinal. Pt with decr grasp holding glass and attempting to use Lt hand with Rt hand assisting to drink tea. Pt remains CIR candidate to d/c home at higher level of functional independence. ADL Comments: Pt is a fall risk and reports falling in last 6 months. Pt states "i fall all the time my wife is the steady one" pt defensive verbally during session when challenged. The education had to be a question and answer type of session to let patient create the answer to keep him engaged. Pt with chair alarm placed. Pt very aware of alarm and states "they have bells on everything here"    Mobility  Bed Mobility: Not assessed Supine to Sit: 4: Min assist;HOB flat Sitting - Scoot to Edge of Bed: 4: Min assist    Transfers  Transfers: Sit to Stand;Stand to Sit Sit to Stand: 4: Min assist;From chair/3-in-1;With upper extremity assist;With armrests Stand to Sit: 4: Min guard;With upper extremity assist;To chair/3-in-1;With armrests    Ambulation / Gait / Stairs / Wheelchair Mobility  Ambulation/Gait Ambulation/Gait Assistance: 4: Min Environmental consultant (Feet): 100 Feet Assistive device: Rolling walker Ambulation/Gait Assistance Details: pt needed max cues for posture, walker position and to attent to left for barrier/obstacle avoidance: hit barriers/obstacles x6 during this gait trial and when cued to negotiate said object/barrier pt presented a "reason" for running into it each time Gait Pattern: Step-through pattern;Decreased stride length;Shuffle;Trunk flexed;Narrow base of support Stairs: Yes Stairs Assistance: 3: Mod assist;4: Min assist Stairs Assistance Details (indicate cue type and reason): pt frequently stepping up with weaker leg and down with stronger leg and therefore much more unsafe than it needed to be. Stair Management Technique: One rail Right;Step to pattern;Sideways;Other (comment) (rail held with both hands) Number of Stairs: 5 Wheelchair Mobility Wheelchair  Mobility: No    Posture / Balance Static Sitting Balance Static Sitting - Balance Support: Feet supported;Right upper extremity supported;Left upper extremity supported Static Sitting - Level of Assistance: 5: Stand by assistance Dynamic Sitting Balance Dynamic Sitting - Balance Support: Feet supported;Right upper extremity supported;Left upper extremity supported Dynamic Sitting - Level of Assistance: 4: Min assist Dynamic Sitting - Comments: pt frequently listing posteriorly and to left when trying to get to EOB to prep for standing    Special needs/care consideration Bowel mgmt:continent Bladder mgmt:foley    Previous Home Environment Living Arrangements: Spouse/significant other  Lives With: Spouse Available Help at Discharge: Family;Available PRN/intermittently Type of Home: House Home Layout: One level Home Access: Stairs to enter Entrance Stairs-Rails: Left;Right;Can reach both (no rails in garage entrance) Entrance  Stairs-Number of Steps: 4; 2 in garage Bathroom Shower/Tub: Engineer, manufacturing systems: Standard Home Care Services: No Additional Comments: Wife is staying with son while husband is at hospital, she has had h/o CVA. Pt reports he does not want to depend on his wife.  Discharge Living Setting Plans for Discharge Living Setting: Patient's home;Lives with (comment) (spouse of 62 years) Type of Home at Discharge: House Discharge Home Layout: One level Discharge Home Access: Stairs to enter Entrance Stairs-Rails: Right;Left;Can reach both Entrance Stairs-Number of Steps: 4 and 2 Discharge Bathroom Shower/Tub: Tub/shower unit Discharge Bathroom Toilet: Standard Discharge Bathroom Accessibility: Yes How Accessible: Accessible via walker Does the patient have any problems obtaining your medications?: No  Social/Family/Support Systems Patient Roles: Spouse;Caregiver Contact Information: Kahleb Mcclane  [phone] Anticipated Caregiver: Andrey Campanile, daughter in  law Anticipated Caregiver's Contact Information: Silver Parkey, son is contact Ability/Limitations of Caregiver: Andrey Campanile 24/7 supervision Caregiver Availability: 24/7 Discharge Plan Discussed with Primary Caregiver: Yes Is Caregiver In Agreement with Plan?: Yes Does Caregiver/Family have Issues with Lodging/Transportation while Pt is in Rehab?: No    Goals/Additional Needs Patient/Family Goal for Rehab: supervision PT and OT Expected length of stay: ELOS 7 days Additional Information: patient with decreased safety awareness Pt/Family Agrees to Admission and willing to participate: Yes Program Orientation Provided & Reviewed with Pt/Caregiver Including Roles  & Responsibilities: Yes   Decrease burden of Care through IP rehab admission: n/a  Possible need for SNF placement upon discharge:n/a   Patient Condition: This patient's condition remains as documented in the consult dated 08/26/12, in which the Rehabilitation Physician determined and documented that the patient's condition is appropriate for intensive rehabilitative care in an inpatient rehabilitation facility pending social supports at d/c 24/7 supervision as well as pt min assist currently. These areas have been addressed.   Will admit to inpatient rehab today.  Preadmission Screen Completed By:  Clois Dupes, 08/28/2012 11:36 AM ______________________________________________________________________   Discussed status with Dr. Riley Kill on 08/28/12 at  1135 and received telephone approval for admission today.  Admission Coordinator:  Clois Dupes, time 8295 Date 08/28/2012.

## 2012-08-28 NOTE — Progress Notes (Signed)
I met with patient at bedside and spoke with his son , Brandon Hardy, by phone. Both are in agreement to an inpt rehab admission and aware that 24/7 supervision is recommended for the patient and his wife at d/c. Brandon Hardy, daughter in law has agreed to provide this care per Brandon Hardy and pt. I will arrange admission to inpt rehab admission for today. [phone]

## 2012-08-28 NOTE — H&P (Addendum)
Physical Medicine and Rehabilitation Admission H&P  Chief Complaint   Patient presents with   .  Left sided weakness and facial droop.   :  HPI: Brandon Hardy is a 79 y.o. right-handed male with history of OA, chronic back pain, gait disorder; who was admitted 08/23/2012 with acute left-sided weakness while mowing his lawn. MRI of the brain showed acute right MCA territory infarction. Hypokalemia 3.3 with supplement added. MRA of the head with no proximal stenosis or aneurysm. Echocardiogram with ejection fraction of 55% without emboli. Carotid Dopplers with no ICA stenosis. TEE pending. Patient did not receive TPA. Patient has a history of GIB due to ulcer and esophageal bleed (secondary to ETOH) along with thrombocytopenia-- platelet 89,000 on admission. UDS positive for opiates. Neurology services consulted and recommended low dose aspirin therapy for CVA prophylaxis. TEE with tiny PFO and BLE dopplers without evidence of DVT. Patient continues with left inattention with poor awareness, poor insight and resistant to redirection. Therapy team recommended CIR and patient admitted today.  Review of Systems  HENT: Negative for hearing loss.  Eyes: Negative for blurred vision and double vision.  Respiratory: Negative for cough, sputum production and wheezing.  Cardiovascular: Negative for chest pain and palpitations.  Gastrointestinal: Negative for heartburn, nausea and abdominal pain.  Genitourinary: Positive for urgency and frequency.  Musculoskeletal: Positive for myalgias, back pain and falls.  Neurological: Negative for dizziness, tingling, focal weakness and headaches.   Past Medical History   Diagnosis  Date   .  Arthritis    .  Blood transfusion    .  Renal disorder    .  Thrombocytopenia    .  Heart murmur    .  Chronic back pain    .  Peripheral edema      was on Lasix but has been off for about 3months   .  GERD (gastroesophageal reflux disease)      takes Prilosec daily   .   GI bleed      hx of   .  Constipation      takes Senna daily and eats Metamucil daily   .  Urinary frequency    .  History of kidney stones    .  History of blood transfusion      no abnormal reaction noted   .  Cataracts, bilateral      immature   .  History of blood clots      left leg   .  History of Clostridium difficile infection    .  BPH (benign prostatic hyperplasia)    .  Stroke  08/22/2012   .  Esophageal varices with bleeding  approx 20 years ago     per pt history   .  Thrombocytopenia, unspecified  08/28/2012   .  PFO (patent foramen ovale)  08/28/2012     Per TEE 08/2012    Past Surgical History   Procedure  Laterality  Date   .  Fracture surgery     .  Back surgery       multiple   .  Neck surgery   2013     x 2/plates, screws, decreased mobility   .  Left finger surgery     .  Foot surgery     .  Colonoscopy     .  Esophagogastroduodenoscopy     .  Gi bleed     .  Ulnar nerve transposition   01/09/2012  Procedure: ULNAR NERVE DECOMPRESSION/TRANSPOSITION; Surgeon: Kathryne Hitch, MD; Location: [facility] OR; Service: Orthopedics; Laterality: Right; Right ulnar nerve decompression at the elbow/cubital tunnel and transposition   .  Inguinal hernia repair  Right  04/23/2012     Procedure: HERNIA REPAIR INGUINAL ADULT; Surgeon: Kandis Cocking, MD; Location: [facility]; Service: General; Laterality: Right;   .  Insertion of mesh  Right  04/23/2012     Procedure: INSERTION OF MESH; Surgeon: Kandis Cocking, MD; Location: [facility]; Service: General; Laterality: Right;   .  Tee without cardioversion  N/A  08/26/2012     Procedure: TRANSESOPHAGEAL ECHOCARDIOGRAM (TEE); Surgeon: Pricilla Riffle, MD; Location: [facility] ENDOSCOPY; Service: Cardiovascular; Laterality: N/A;    History reviewed. No pertinent family history.  Social History: Married. Independent with cane PTA. Daughter in law assisting wife at this time. eports that he quit smoking about 39 years ago. His smoking use included  Cigarettes. He smoked 0.00 packs per day. He has never used smokeless tobacco. He reports that he does not drink alcohol or use illicit drugs.  Allergies   Allergen  Reactions   .  Chlorhexidine  Rash    Medications Prior to Admission   Medication  Sig  Dispense  Refill   .  docusate sodium (COLACE) 100 MG capsule  Take 100 mg by mouth 2 (two) times daily.     Marland Kitchen  gabapentin (NEURONTIN) 100 MG capsule  Take 100 mg by mouth 2 (two) times daily.     Marland Kitchen  omeprazole (PRILOSEC) 20 MG capsule  Take 20 mg by mouth daily.     .  predniSONE (DELTASONE) 10 MG tablet  Take 10 mg by mouth daily.     .  riboflavin (VITAMIN B-2) 100 MG TABS tablet  Take 100 mg by mouth daily.      Home:  Home Living  Family/patient expects to be discharged to:: Private residence  Living Arrangements: Spouse/significant other  Available Help at Discharge: Family;Available PRN/intermittently  Type of Home: House  Home Access: Stairs to enter  [employer] of Steps: 4; 2 in garage  Entrance Stairs-Rails: Left;Right;Can reach both (no rails in garage entrance)  Home Layout: One level  Home Equipment: Walker - 2 wheels;Cane - single point;Grab bars - toilet;Tub bench;Shower seat;Hand held shower head  Additional Comments: Wife is staying with son while husband is at hospital, she has had h/o CVA. Pt reports he does not want to depend on his wife.  Lives With: Spouse  Functional History:  Prior Function  Vocation: Retired  Comments: patient uses Artist Status:  Mobility:  Bed Mobility  Bed Mobility: Not assessed  Supine to Sit: 4: Min assist;HOB flat  Sitting - Scoot to [place] of Bed: 4: Min assist  Transfers  Transfers: Sit to Stand;Stand to Sit  Sit to Stand: 4: Min assist;From chair/3-in-1;With upper extremity assist;With armrests  Stand to Sit: 4: Min guard;With upper extremity assist;To chair/3-in-1;With armrests  Ambulation/Gait  Ambulation/Gait Assistance: 4: Min assist  Ambulation  Distance (Feet): 100 Feet  Assistive device: Rolling walker  Ambulation/Gait Assistance Details: pt needed max cues for posture, walker position and to attent to left for barrier/obstacle avoidance: hit barriers/obstacles x6 during this gait trial and when cued to negotiate said object/barrier pt presented a "reason" for running into it each time  Gait Pattern: Step-through pattern;Decreased stride length;Shuffle;Trunk flexed;Narrow base of support  Stairs: Yes  Stairs Assistance: 3: Mod assist;4: Min assist  Stairs Assistance Details (indicate cue type  and reason): pt frequently stepping up with weaker leg and down with stronger leg and therefore much more unsafe than it needed to be.  Stair Management Technique: One rail Right;Step to pattern;Sideways;Other (comment) (rail held with both hands)  Number of Stairs: 5  Wheelchair Mobility  Wheelchair Mobility: No  ADL:  ADL  Eating/Feeding: Performed;Modified independent  Where Assessed - Eating/Feeding: Chair  Upper Body Bathing: Minimal assistance;Simulated  Where Assessed - Upper Body Bathing: Unsupported sitting  Lower Body Bathing: Simulated;Moderate assistance  Where Assessed - Lower Body Bathing: Supported sit to stand  Upper Body Dressing: Performed;Minimal assistance  Where Assessed - Upper Body Dressing: Unsupported sitting  Lower Body Dressing: Performed;Maximal assistance  Where Assessed - Lower Body Dressing: Supported sit to stand  Toilet Transfer: Min Advertising copywriter Method: Sit to Production manager: Raised toilet seat with arms (or 3-in-1 over toilet)  Equipment Used: Rolling walker;Cane  Transfers/Ambulation Related to ADLs: Pt ambulating with RW and asking for cane use. pt provided cane with poor return demo. Pt reports "i was using a cane before I came up here" Pt reports wife uses a cane sometimes but her balance is good. pt walking into door ways, doors, therapist, bed and cords on left side during  transfers. When therapist gives verbal cues / feedback pt states "yall got to many cords in here. You stuck your foot out in front of me on purpose" Ot standing to the left of patient as PT entered room with patient. Pt progressed in the direction of OT . OT remained in one spot and pt rolled over OT left foot. Pt given v/c you are running over my foot pt proceed to attempt to proceed forward. Pt with lack of awarenss to left side inattention. Pt with incr safety errors with RW with fatigue. Pt insist on a condom cath but reports he will use a urinal at home. When encouraged to use urinal at hospital he reports deficits holding the urinal. Pt with decr grasp holding glass and attempting to use Lt hand with Rt hand assisting to drink tea. Pt remains CIR candidate to d/c home at higher level of functional independence.  ADL Comments: Pt is a fall risk and reports falling in last 6 months. Pt states "i fall all the time my wife is the steady one" pt defensive verbally during session when challenged. The education had to be a question and answer type of session to let patient create the answer to keep him engaged. Pt with chair alarm placed. Pt very aware of alarm and states "they have bells on everything here"  Cognition:  Cognition  Overall Cognitive Status: Impaired/Different from baseline  Arousal/Alertness: Awake/alert  Orientation Level: Oriented X4  Attention: Selective  Selective Attention: Appears intact  Memory: Appears intact  Awareness: Appears intact  Problem Solving: Appears intact  Executive Function: Reasoning;Sequencing  Reasoning: Appears intact  Sequencing: Appears intact  Safety/Judgment: Appears intact  Cognition  Arousal/Alertness: Awake/alert  Behavior During Therapy: WFL for tasks assessed/performed  Overall Cognitive Status: Impaired/Different from baseline  Area of Impairment: Memory;Following commands;Safety/judgement;Problem solving  Memory: Decreased recall of  precautions  Following Commands: Follows one step commands consistently  Safety/Judgement: Decreased awareness of safety;Decreased awareness of deficits  Problem Solving: Slow processing    Physical Exam:  Blood pressure 116/58, pulse 62, temperature 98.7 F (37.1 C), temperature source Oral, resp. rate 18, height 5\' 7"  (1.702 m), weight 61.553 kg (135 lb 11.2 oz), SpO2 98.00%.   Constitutional:  He is oriented to person, place, and time. He appears cachectic.  HENT: mucosa pink/moist Head: Normocephalic and atraumatic.  Wears full set dentures.  Eyes: Conjunctivae are normal. Pupils are equal, round, and reactive to light.  Neck: Normal range of motion. Neck supple.  Cardiovascular: Normal rate.  Frequent PVCs/ no murmurs, rubs Pulmonary/Chest: Effort normal and breath sounds normal. No respiratory distress. He has no wheezes.  Abdominal: Soft. Bowel sounds are normal. There is no tenderness. There is no rebound.  Musculoskeletal: He exhibits no edema and no tenderness.  Neurological: He is alert and oriented to person, place, and time.  Follows commands. Very alert.  left central 7, speech is dysarthric. There is also leftward deviation of the tongue. Mild left pronator drift. Strength nearly symmetrical in all 4's at 4/5. No gross sensory deficits. Decreased FMC of the LUE greater than LLE with impaired FTN and HTS.  Skin: Skin is warm and dry.   Results for orders placed during the hospital encounter of 08/23/12 (from the past 48 hour(s))   CBC Status: Abnormal    Collection Time    08/28/12 5:55 AM   Result  Value  Range    WBC  4.9  4.0 - 10.5 K/uL    RBC  3.13 (*)  4.22 - 5.81 MIL/uL    Hemoglobin  10.7 (*)  13.0 - 17.0 g/dL    HCT  16.1 (*)  09.6 - 52.0 %    MCV  101.0 (*)  78.0 - 100.0 fL    MCH  34.2 (*)  26.0 - 34.0 pg    MCHC  33.9  30.0 - 36.0 g/dL    RDW  04.5  40.9 - 81.1 %    Platelets  98 (*)  150 - 400 K/uL    Comment:  CONSISTENT WITH PREVIOUS RESULT   BASIC  METABOLIC PANEL Status: Abnormal    Collection Time    08/28/12 5:55 AM   Result  Value  Range    Sodium  139  135 - 145 mEq/L    Potassium  3.9  3.5 - 5.1 mEq/L    Chloride  108  96 - 112 mEq/L    CO2  26  19 - 32 mEq/L    Glucose, Bld  82  70 - 99 mg/dL    BUN  21  6 - 23 mg/dL    Creatinine, Ser  9.14  0.50 - 1.35 mg/dL    Calcium  8.7  8.4 - 10.5 mg/dL    GFR calc non Af Amer  65 (*)  >90 mL/min    GFR calc Af Amer  75 (*)  >90 mL/min    Comment:  (NOTE)     The eGFR has been calculated using the CKD EPI equation.     This calculation has not been validated in all clinical situations.     eGFR's persistently <90 mL/min signify possible Chronic Kidney     Disease.    No results found.  Post Admission Physician Evaluation:  1. Functional deficits secondary to thrombotic right MCA infarct. 2. Patient is admitted to receive collaborative, interdisciplinary care between the physiatrist, rehab nursing staff, and therapy team. 3. Patient's level of medical complexity and substantial therapy needs in context of that medical necessity cannot be provided at a lesser intensity of care such as a SNF. 4. Patient has experienced substantial functional loss from his/her baseline which was documented above under the "Functional History" and "Functional Status" headings. Judging  by the patient's diagnosis, physical exam, and functional history, the patient has potential for functional progress which will result in measurable gains while on inpatient rehab. These gains will be of substantial and practical use upon discharge in facilitating mobility and self-care at the household level. 5. Physiatrist will provide 24 hour management of medical needs as well as oversight of the therapy plan/treatment and provide guidance as appropriate regarding the interaction of the two. 6. 24 hour rehab nursing will assist with bladder management, bowel management, safety, skin/wound care, disease management,  medication administration, pain management and patient education and help integrate therapy concepts, techniques,education, etc. 7. PT will assess and treat for/with: Lower extremity strength, range of motion, stamina, balance, functional mobility, safety, adaptive techniques and equipment, NMR, education. Goals are: mod I to supervision. 8. OT will assess and treat for/with: ADL's, functional mobility, safety, upper extremity strength, adaptive techniques and equipment, NMR, education. Goals are: mod I to supervision. 9. SLP will assess and treat for/with: --hold for now. Pt near baseline. Consult if needed 10. Case Management and Social Worker will assess and treat for psychological issues and discharge planning. 11. Team conference will be held weekly to assess progress toward goals and to determine barriers to discharge. 12. Patient will receive at least 3 hours of therapy per day at least 5 days per week. 13. ELOS: 10-12 days  14. Prognosis: excellent   Medical Problem List and Plan:  1. DVT Prophylaxis/Anticoagulation: Mechanical: Sequential compression devices, below knee Bilateral lower extremities  2. Chronic back pain/ Pain Management: Was started on prednisone in the past month by Dr. Noel Gerold and this has helped some. Continue chronic steroids and neurontin.  3. Mood: Will have LCSW follow for evaluation.  4. Neuropsych: This patient is capable of making decisions on his own behalf.  5. Thrombocytopenia: Will monitor for signs of bleeding/routine check of CBC. Reports platelets normally in 80's range.  6. GERD: Protonix is ineffective and causing worsening of symptoms. Will resume home Prilosec.   Ranelle Oyster, MD, [facility] Physical Medicine & Rehabilitation   08/28/2012

## 2012-08-28 NOTE — Discharge Summary (Signed)
Physician Discharge Summary  Brandon Hardy EXB:[id number] DOB: July 24, 1927 DOA: 08/23/2012  PCP: Brandon Alken, MD  Admit date: 08/23/2012 Discharge date: 08/28/2012  Time spent: 65 minutes  Recommendations for Outpatient Follow-up:  1. Follow up with Brandon Hardy of neurology in 2 months. 2. Follow up with Brandon Alken, MD 2 weeks post discharge from Inpatient Rehab.  Discharge Diagnoses:  Principal Problem:   CVA (cerebral infarction) Active Problems:   Gait disturbance   GERD (gastroesophageal reflux disease)   BPH (benign prostatic hyperplasia)   Acute left-sided muscle weakness   Hypokalemia   Thrombocytopenia, unspecified   Chronic back pain   PFO (patent foramen ovale)   Unspecified constipation   Discharge Condition: Stable and improved.  Diet recommendation: Heart healthy  Filed Weights   08/23/12 1308 08/27/12 0244  Weight: 61.281 kg (135 lb 1.6 oz) 61.553 kg (135 lb 11.2 oz)    History of present illness:  Brandon Hardy is a 79 y.o. male with medical problems as listed below who presents with the above complaints. He states that he was out mowing his lawn yesterday and went inside to use the bathroom and noticed that he was having difficulty unbuttoning his clothes. He Also felt weak weak overall and more so his L. Side. His symptoms were persisting today and his son noted that he had a facial droop and weakness on his left side, so he was brought to ED. Pt reports he has had numbness in his hands due to"neck problems' for which he is followed by Brandon Hardy. He was seen in ED head CT neg, K 3.3, neuro consulted and admission to [facility] requested. He denies dysphagia, slurred speech, headaches and no visual changes.   Hospital Course:  #1 acute right MCA stroke Patient had presented with left upper extremity and left lower extremity weakness as well as left facial droop. Patient was deemed not a TPA candidate due to been out of the window. Patient was  admitted to the telemetry floor and a stroke workup undertaken. CT head which was done on admission was negative for any acute abnormalities. MRI MRA of the head showed an acute right MCA territory infarct with MRA of the head showing severe narrowing of the right MCA M3 branch 2-D echo was done which was negative for any cardiac source of emboli. Carotid Dopplers which were done had no significant ICA stenosis. A TEE was subsequently performed that showed a tiny PFO. Lower extremity Dopplers were done which were negative for any evidence of DVT. Patient was maintained on aspirin for secondary stroke prevention. Patient was seen by physical therapy.  Fasting lipid panel which was done and the LDL of 51. Patient was seen by neurology and followed by neurology throughout the hospitalization. Patient was also seen by physical therapy. Patient will be discharged to inpatient rehabilitation and will followup with neurology 2 months post discharge. Patient was discharged in stable and improved condition.  #2 gastroesophageal reflux disease Remained stable throughout the hospitalization. Patient was maintained on a PPI.  #3 hypokalemia Was noted on admission to hospital the patient was hypokalemic. Patient's potassium was repleted and his hypokalemia had resolved by day of discharge.  #4 chronic thrombocytopenia Patient with no overt bleeding. Lovenox was avoided. Patient was maintained on aspirin. Patient's platelets remained stable.  #5 constipation During the hospitalization patient was noted to be constipated. Patient was placed on milk of magnesia as needed with resolution of his constipation.  #6 PFO Patient was noted to have  a tiny PFO prior transesophageal echocardiogram. It was felt per neurology to be incidental. Lower extremity Dopplers which were done were negative for DVT.  #7 history of chronic back pain Remained stable throughout the hospitalization. On admission patient was placed on  prednisone 20 mg daily which was decreased back to his home regimen of 10 mg daily. Patient was also maintained on Tylenol No. 3 as needed.  The rest of patient's chronic medical issues remained stable throughout the hospitalization the patient be discharged in stable and improved condition. Procedures:  MRI MRA head 08/23/2012  CT head 08/23/2012  Chest x-ray 08/23/2012  2-D echo 08/23/2012  TEE 08/26/2012  Carotid Dopplers 08/23/2012  Lower extremity Dopplers 08/26/2012  Consultations:  Neurology: Brandon Hardy 08/23/2012  Rehabilitation medicine Brandon Hardy 08/26/2012  Discharge Exam: Filed Vitals:   08/28/12 1048  BP: 116/58  Pulse: 62  Temp: 98.7 F (37.1 C)  Resp: 18    General: NAD Cardiovascular: RRR Respiratory: CTAB  Discharge Instructions     Medication List         docusate sodium 100 MG capsule  Commonly known as:  COLACE  Take 100 mg by mouth 2 (two) times daily.     gabapentin 100 MG capsule  Commonly known as:  NEURONTIN  Take 100 mg by mouth 2 (two) times daily.     omeprazole 20 MG capsule  Commonly known as:  PRILOSEC  Take 20 mg by mouth daily.     predniSONE 10 MG tablet  Commonly known as:  DELTASONE  Take 10 mg by mouth daily.     riboflavin 100 MG Tabs tablet  Commonly known as:  VITAMIN B-2  Take 100 mg by mouth daily.       Allergies  Allergen Reactions  . Chlorhexidine Rash   Follow-up Information   Follow up with Brandon Rigg, MD. Schedule an appointment as soon as possible for a visit in 2 months. (stroke clinic)    Specialties:  Neurology, Radiology   Contact information:   [address] [place] [address] [phone]        The results of significant diagnostics from this hospitalization (including imaging, microbiology, ancillary and laboratory) are listed below for reference.    Significant Diagnostic Studies: Dg Chest 2 View  08/23/2012   *RADIOLOGY REPORT*  Clinical Data:  Cerebrovascular accident.  CHEST - 2 VIEW  Comparison: July 30, 2011.  Findings: Stable mild cardiomegaly.  No acute pulmonary disease is noted. No pleural effusion or pneumothorax is noted.  Bony thorax is intact.  IMPRESSION: No acute cardiopulmonary abnormality seen.   Original Report Authenticated By: Lupita Raider.,  M.D.   Ct Head Wo Contrast  08/23/2012   *RADIOLOGY REPORT*  Clinical Data: Fatigue.  CT HEAD WITHOUT CONTRAST  Technique:  Contiguous axial images were obtained from the base of the skull through the vertex without contrast.  Comparison: 07/30/2011.  Findings: No intracranial hemorrhage.  Global atrophy without hydrocephalus.  Small vessel disease type changes without CT evidence of large acute infarct.  Vascular calcifications.  No intracranial mass lesion detected on this unenhanced exam.  Mastoid air cells, middle ear cavities and visualized paranasal sinuses are clear.  Orbital structures unremarkable.  IMPRESSION: No acute abnormality.  Please see above.   Original Report Authenticated By: Lacy Duverney, M.D.   Mr Brain Wo Contrast  08/23/2012   *RADIOLOGY REPORT*  Clinical Data:  Acute onset of left face, arm, and leg weakness.  MRI HEAD WITHOUT  CONTRAST MRA HEAD WITHOUT CONTRAST  Technique:  Multiplanar, multiecho pulse sequences of the brain and surrounding structures were obtained without intravenous contrast. Angiographic images of the head were obtained using MRA technique without contrast.  Comparison:  CT head 08/23/2012.  MRI HEAD  Findings:  There is a significant area of acute right hemisphere infarction involving the right posterior frontal precentral gyrus, subcortical white matter, posterior insular ribbon, and right inferior frontal operculum.  This is non hemorrhagic.  No midline shift.  No mass lesion or hydrocephalus.  Moderately advanced atrophy.  Extensive chronic microvascular ischemic change.  No foci of chronic hemorrhage.  No osseous lesions.  Prior cervical  fusion.  Normal pituitary and cerebellar tonsils.  Mild chronic sinus disease.  IMPRESSION: Acute right MCA territory infarction. This involves the right posterior frontal cortex, inferior frontal operculum, and right insular ribbon.  The distribution is consistent with the patient's history of left hemiparesis.  Atrophy and small vessel disease.  MRA HEAD  Findings: The internal carotid arteries are widely patent.  The basilar artery is widely patent.  Vertebrals codominant.  There is no proximal or distal stenosis of the anterior cerebral arteries.  Both middle cerebral arteries are widely patent in their M1 segments, and in the proximal M2 segments.  There is a high-grade stenosis of a right M3 MCA branch, representing intracranial atherosclerotic change, subserving the precentral gyrus accounting for the observed infarction.  Posterior cerebral arteries widely patent.  No cerebellar branch occlusion.  No intracranial aneurysm.  IMPRESSION: No proximal intracranial stenosis or aneurysm.  Severe narrowing of a right MCA M3 branch likely accounts for the observed intracranial infarction.   Original Report Authenticated By: Davonna Belling, M.D.   Mr Mra Head/brain Wo Cm  08/23/2012   *RADIOLOGY REPORT*  Clinical Data:  Acute onset of left face, arm, and leg weakness.  MRI HEAD WITHOUT CONTRAST MRA HEAD WITHOUT CONTRAST  Technique:  Multiplanar, multiecho pulse sequences of the brain and surrounding structures were obtained without intravenous contrast. Angiographic images of the head were obtained using MRA technique without contrast.  Comparison:  CT head 08/23/2012.  MRI HEAD  Findings:  There is a significant area of acute right hemisphere infarction involving the right posterior frontal precentral gyrus, subcortical white matter, posterior insular ribbon, and right inferior frontal operculum.  This is non hemorrhagic.  No midline shift.  No mass lesion or hydrocephalus.  Moderately advanced atrophy.  Extensive  chronic microvascular ischemic change.  No foci of chronic hemorrhage.  No osseous lesions.  Prior cervical fusion.  Normal pituitary and cerebellar tonsils.  Mild chronic sinus disease.  IMPRESSION: Acute right MCA territory infarction. This involves the right posterior frontal cortex, inferior frontal operculum, and right insular ribbon.  The distribution is consistent with the patient's history of left hemiparesis.  Atrophy and small vessel disease.  MRA HEAD  Findings: The internal carotid arteries are widely patent.  The basilar artery is widely patent.  Vertebrals codominant.  There is no proximal or distal stenosis of the anterior cerebral arteries.  Both middle cerebral arteries are widely patent in their M1 segments, and in the proximal M2 segments.  There is a high-grade stenosis of a right M3 MCA branch, representing intracranial atherosclerotic change, subserving the precentral gyrus accounting for the observed infarction.  Posterior cerebral arteries widely patent.  No cerebellar branch occlusion.  No intracranial aneurysm.  IMPRESSION: No proximal intracranial stenosis or aneurysm.  Severe narrowing of a right MCA M3 branch likely accounts for the  observed intracranial infarction.   Original Report Authenticated By: Davonna Belling, M.D.    Microbiology: No results found for this or any previous visit (from the past 240 hour(s)).   Labs: Basic Metabolic Panel:  Recent Labs Lab 08/23/12 0903 08/24/12 0430 08/25/12 0500 08/28/12 0555  NA 144 140 139 139  K 3.3* 5.1 4.7 3.9  CL 110 110 109 108  CO2 27 25 24 26   GLUCOSE 97 85 90 82  BUN 17 18 19 21   CREATININE 1.11 0.91 1.01 1.04  CALCIUM 8.9 8.6 8.7 8.7   Liver Function Tests:  Recent Labs Lab 08/23/12 0903  AST 26  ALT 13  ALKPHOS 67  BILITOT 1.3*  PROT 5.9*  ALBUMIN 2.8*   No results found for this basename: LIPASE, AMYLASE,  in the last 168 hours No results found for this basename: AMMONIA,  in the last 168  hours CBC:  Recent Labs Lab 08/23/12 0903 08/24/12 0430 08/25/12 0500 08/26/12 0445 08/28/12 0555  WBC 5.7 5.5 5.7 5.3 4.9  NEUTROABS 4.0  --   --   --   --   HGB 10.7* 10.2* 10.5* 10.5* 10.7*  HCT 31.6* 30.8* 31.4* 31.4* 31.6*  MCV 101.0* 102.0* 101.6* 102.3* 101.0*  PLT 83* 81* 88* 95* 98*   Cardiac Enzymes:  Recent Labs Lab 08/23/12 0907  TROPONINI <0.30   BNP: BNP (last 3 results) No results found for this basename: PROBNP,  in the last 8760 hours CBG:  Recent Labs Lab 08/23/12 0927  GLUCAP 85       Signed:  THOMPSON,DANIEL  [facility] 08/28/2012, 11:00 AM

## 2012-08-28 NOTE — Progress Notes (Signed)
Report given to [facility] from [facility]. Family is at bedside and will accompany him to CIR. All questions answered at this time. All belong sent with patient.   Sim Boast, RN 08/28/12 1345

## 2012-08-29 ENCOUNTER — Inpatient Hospital Stay (HOSPITAL_COMMUNITY): Payer: Medicare Other

## 2012-08-29 ENCOUNTER — Inpatient Hospital Stay (HOSPITAL_COMMUNITY): Payer: Medicare Other | Admitting: *Deleted

## 2012-08-29 DIAGNOSIS — M4712 Other spondylosis with myelopathy, cervical region: Secondary | ICD-10-CM

## 2012-08-29 DIAGNOSIS — I6330 Cerebral infarction due to thrombosis of unspecified cerebral artery: Secondary | ICD-10-CM

## 2012-08-29 LAB — CBC WITH DIFFERENTIAL/PLATELET
Basophils Absolute: 0 10*3/uL (ref 0.0–0.1)
Basophils Relative: 0 % (ref 0–1)
Eosinophils Absolute: 0.3 10*3/uL (ref 0.0–0.7)
Hemoglobin: 10.7 g/dL — ABNORMAL LOW (ref 13.0–17.0)
MCH: 33.4 pg (ref 26.0–34.0)
MCHC: 33.1 g/dL (ref 30.0–36.0)
Monocytes Relative: 10 % (ref 3–12)
Neutrophils Relative %: 51 % (ref 43–77)
RDW: 13.6 % (ref 11.5–15.5)

## 2012-08-29 LAB — COMPREHENSIVE METABOLIC PANEL
ALT: 12 U/L (ref 0–53)
AST: 23 U/L (ref 0–37)
Alkaline Phosphatase: 62 U/L (ref 39–117)
BUN: 22 mg/dL (ref 6–23)
Calcium: 8.5 mg/dL (ref 8.4–10.5)
GFR calc Af Amer: 74 mL/min — ABNORMAL LOW (ref >90)
GFR calc non Af Amer: 64 mL/min — ABNORMAL LOW (ref >90)
Potassium: 4 mEq/L (ref 3.5–5.1)
Total Bilirubin: 0.6 mg/dL (ref 0.3–1.2)

## 2012-08-29 NOTE — Progress Notes (Signed)
Social Work Assessment and Plan Social Work Assessment and Plan  Patient Details  Name: Brandon Hardy MRN: [id number] Date of Birth: 11/23/1927  Today's Date: 08/29/2012  Problem List:  Patient Active Problem List   Diagnosis Date Noted  . Thrombocytopenia, unspecified 08/28/2012  . Chronic back pain 08/28/2012  . PFO (patent foramen ovale) 08/28/2012  . Unspecified constipation 08/28/2012  . Acute left-sided muscle weakness 08/23/2012  . Hypokalemia 08/23/2012  . CVA (cerebral infarction) 08/23/2012  . Cubital tunnel syndrome on right 01/09/2012  . Right groin pain 09/19/2011  . Gait disturbance 07/30/2011  . Fall 07/30/2011  . Humerus fracture 07/30/2011  . GERD (gastroesophageal reflux disease) 07/30/2011  . BPH (benign prostatic hyperplasia) 07/30/2011  . Leg edema 07/30/2011   Past Medical History:  Past Medical History  Diagnosis Date  . Arthritis   . Blood transfusion   . Renal disorder   . Thrombocytopenia   . Heart murmur   . Chronic back pain   . Peripheral edema     was on Lasix but has been off for about 3months  . GERD (gastroesophageal reflux disease)     takes Prilosec daily  . GI bleed     hx of   . Constipation     takes Senna daily and eats Metamucil daily  . Urinary frequency   . History of kidney stones   . History of blood transfusion     no abnormal reaction noted  . Cataracts, bilateral     immature  . History of blood clots     left leg  . History of Clostridium difficile infection   . BPH (benign prostatic hyperplasia)   . Stroke 08/22/2012  . Esophageal varices with bleeding approx 20 years ago    per pt history  . Thrombocytopenia, unspecified 08/28/2012  . PFO (patent foramen ovale) 08/28/2012    Per TEE 08/2012   Past Surgical History:  Past Surgical History  Procedure Laterality Date  . Fracture surgery    . Back surgery      multiple  . Neck surgery  2013    x 2/plates, screws, decreased mobility  . Left finger surgery     . Foot surgery    . Colonoscopy    . Esophagogastroduodenoscopy    . Gi bleed    . Ulnar nerve transposition  01/09/2012    Procedure: ULNAR NERVE DECOMPRESSION/TRANSPOSITION;  Surgeon: Kathryne Hitch, MD;  Location: [facility] OR;  Service: Orthopedics;  Laterality: Right;  Right ulnar nerve decompression at the elbow/cubital tunnel and transposition  . Inguinal hernia repair Right 04/23/2012    Procedure: HERNIA REPAIR INGUINAL ADULT;  Surgeon: Kandis Cocking, MD;  Location: [facility] ORS;  Service: General;  Laterality: Right;  . Insertion of mesh Right 04/23/2012    Procedure: INSERTION OF MESH;  Surgeon: Kandis Cocking, MD;  Location: [facility] ORS;  Service: General;  Laterality: Right;  . Tee without cardioversion N/A 08/26/2012    Procedure: TRANSESOPHAGEAL ECHOCARDIOGRAM (TEE);  Surgeon: Pricilla Riffle, MD;  Location: [facility] ENDOSCOPY;  Service: Cardiovascular;  Laterality: N/A;   Social History:  reports that he quit smoking about 39 years ago. His smoking use included Cigarettes. He smoked 0.00 packs per day. He has never used smokeless tobacco. He reports that he does not drink alcohol or use illicit drugs.  Family / Support Systems Marital Status: Married How Long?: 69 years Patient Roles: Spouse;Parent Spouse/Significant Other: Randa Evens  [phone]-home  [phone]-cell Children: Kenneth-son  [phone]-cell Other  Supports: Andrey Campanile- D-I-L Anticipated Caregiver: Andrey Campanile Ability/Limitations of Caregiver: Sandy up in [place] dealing with her ill Mom.  Wife has had a hisotry of CVA and ambulates with a cane Caregiver Availability: Other (Comment) (Depends upon Sandy's Mom situation) Family Dynamics: Pt and wife have four boys all but one is local.  All are involved and supportive.  They are assisting his wife while he is here.  Pt states: " I can depend upon my boys."  Social History Preferred language: English Religion: Baptist Cultural Background: No issues Education: High School Read: Yes Write:  Yes Employment Status: Retired Fish farm manager Issues: No issues Guardian/Conservator: None-according to MD pt is capable of making his own decisions   Abuse/Neglect Physical Abuse: Denies Verbal Abuse: Denies Sexual Abuse: Denies Exploitation of patient/patient's resources: Denies Self-Neglect: Denies  Emotional Status Pt's affect, behavior adn adjustment status: Pt is motivated and wants to get home as soon as possible.  He reports: " I have to do what is needed my wife can't help me."  He is very stubborn and haed strong regarding his progress.  He is fuly confident he will be independent by the time he leaves. Recent Psychosocial Issues: Other medical issues-balance and lack of sensation of his hands after neck surgery. Pyschiatric History: No history-deferred depression screen due to pt felt not needed.  Will monitor while here. Substance Abuse History: Does drink but feels it is not an issue, he has all of his life and he is 79 years old  Patient / Family Perceptions, Expectations & Goals Pt/Family understanding of illness & functional limitations: Pt and son can explain his condition and deficits.  He had some of these deficits prior to admission after neck surgery.  He feels he can adapt to his balance issues and resume his activities. Premorbid pt/family roles/activities: Father, Husband, Grandfather, Church member, Chief Financial Officer, etc Anticipated changes in roles/activities/participation: Plans to resume Pt/family expectations/goals: Pt states: " I plan to be independent when I leave, my wife can not help me."  Son states: " I hope he can move aorund on his own."  [employer]: Other (Comment) (In the past) Premorbid Home Care/DME Agencies: Other (Comment) (In the past) Transportation available at discharge: Children Resource referrals recommended: Support group (specify) (CVA Support group)  Discharge Planning Living Arrangements:  Spouse/significant other Support Systems: Spouse/significant other;Children;Friends/neighbors;Other relatives Type of Residence: Private residence Insurance Resources: Media planner (specify) Investment banker, operational) Financial Resources: Restaurant manager, fast food Screen Referred: No Living Expenses: Lives with family Money Management: Spouse;Patient Does the patient have any problems obtaining your medications?: No Home Management: Both and family can help with Patient/Family Preliminary Plans: Return home with wife and duaghte rin-law to assist with home management and transportation, if needed.  Pt plans to be independent when he leaves here, he will manage according to him.  Daughter in-law dealing with her Mom in [place] and unsure how long she will be there. Social Work Anticipated Follow Up Needs: HH/OP;Support Group  Clinical Impression Very determined gentleman who is used to adapting to his limitations and creating other ways to accomplish tasks.  Supportive family who are willing to assist, wife can be there but not Provide care.  Son's and daughter in-law's supportive and will assist, Andrey Campanile to assist, but out of town currently.  Will come up with a safe discharge plan.  Lucy Chris 08/29/2012, 12:14 PM

## 2012-08-29 NOTE — Progress Notes (Signed)
Patient information reviewed and entered into eRehab system by Kendyn Zaman, RN, CRRN, PPS Coordinator.  Information including medical coding and functional independence measure will be reviewed and updated through discharge.     Per nursing patient was given "Data Collection Information Summary for Patients in Inpatient Rehabilitation Facilities with attached "Privacy Act Statement-Health Care Records" upon admission.  

## 2012-08-29 NOTE — Progress Notes (Signed)
Physical Therapy Session Note  Patient Details  Name: Brandon Hardy MRN: [id number] Date of Birth: Jan 29, 1927  Today's Date: 08/29/2012 Time: 4098-1191 Time Calculation (min): 49 min  Short Term Goals: Week 1:  PT Short Term Goal 1 (Week 1): Patient will perform bed mobliity with mod I. PT Short Term Goal 2 (Week 1): Patient will perform stand pivot transfers with RW and supervision. PT Short Term Goal 3 (Week 1): Patient will perform gait training x150' in controlled environment with RW and supervision. PT Short Term Goal 4 (Week 1): Patient will negotiate 4 stairs with B handrails and supervision. PT Short Term Goal 5 (Week 1): Patiet will negotiate 2 steps without handrails and with RW and supervision.  Skilled Therapeutic Interventions/Progress Updates:    Patient received sitting in recliner. Session focused on gait training and LE strengthening. See details below for gait training. Patient performed [employer] Exercises standing at parallel bar with B UE support: heel raises, hip abd, knee flex, mini squats x20 reps each. Patient requires minA during exercises and frequent rest breaks. Patient exercised on NuStep Level 4 with B UE and B LE to improve strength, endurance, and coordination. Patient returned to room and left seated edge of bed with nurse tech present to take vitals. Communicated to nurse tech that patient wants to take a nap, to please assist him to supine and set bed alarm on.  Therapy Documentation Precautions:  Precautions Precautions: Fall Precaution Comments: L inattention Restrictions Weight Bearing Restrictions: No Pain: Pain Assessment Pain Assessment: No/denies pain Pain Score: 0-No pain Locomotion : Ambulation Ambulation: Yes Ambulation/Gait Assistance: 4: Min guard Ambulation Distance (Feet): 173 Feet Assistive device: Rolling walker Ambulation/Gait Assistance Details: Verbal cues for gait pattern;Verbal cues for precautions/safety;Tactile cues for  weight shifting Ambulation/Gait Assistance Details: Patient instructed in gait training 173' x2 with RW and min guard in controlled environment. Gait Gait: Yes Gait Pattern: Impaired Gait Pattern: Decreased stride length;Step-through pattern;Decreased step length - right;Decreased step length - left;Trunk flexed;Narrow base of support;Decreased weight shift to left   See FIM for current functional status  Therapy/Group: Individual Therapy  Chipper Herb. Sun Kihn, PT, DPT 08/29/2012, 3:57 PM

## 2012-08-29 NOTE — Progress Notes (Signed)
Occupational Therapy Assessment and Plan  Patient Details  Name: Brandon Hardy MRN: [id number] Date of Birth: 24-Jan-1927  OT Diagnosis: cognitive deficits, disturbance of vision and muscle weakness (generalized) Rehab Potential:  good ELOS:   10-12 days   Today's Date: 08/29/2012 Time: 2841-3244 Time Calculation (min): 60 min  Problem List:  Patient Active Problem List   Diagnosis Date Noted  . Thrombocytopenia, unspecified 08/28/2012  . Chronic back pain 08/28/2012  . PFO (patent foramen ovale) 08/28/2012  . Unspecified constipation 08/28/2012  . Acute left-sided muscle weakness 08/23/2012  . Hypokalemia 08/23/2012  . CVA (cerebral infarction) 08/23/2012  . Cubital tunnel syndrome on right 01/09/2012  . Right groin pain 09/19/2011  . Gait disturbance 07/30/2011  . Fall 07/30/2011  . Humerus fracture 07/30/2011  . GERD (gastroesophageal reflux disease) 07/30/2011  . BPH (benign prostatic hyperplasia) 07/30/2011  . Leg edema 07/30/2011    Past Medical History:  Past Medical History  Diagnosis Date  . Arthritis   . Blood transfusion   . Renal disorder   . Thrombocytopenia   . Heart murmur   . Chronic back pain   . Peripheral edema     was on Lasix but has been off for about 3months  . GERD (gastroesophageal reflux disease)     takes Prilosec daily  . GI bleed     hx of   . Constipation     takes Senna daily and eats Metamucil daily  . Urinary frequency   . History of kidney stones   . History of blood transfusion     no abnormal reaction noted  . Cataracts, bilateral     immature  . History of blood clots     left leg  . History of Clostridium difficile infection   . BPH (benign prostatic hyperplasia)   . Stroke 08/22/2012  . Esophageal varices with bleeding approx 20 years ago    per pt history  . Thrombocytopenia, unspecified 08/28/2012  . PFO (patent foramen ovale) 08/28/2012    Per TEE 08/2012   Past Surgical History:  Past Surgical History   Procedure Laterality Date  . Fracture surgery    . Back surgery      multiple  . Neck surgery  2013    x 2/plates, screws, decreased mobility  . Left finger surgery    . Foot surgery    . Colonoscopy    . Esophagogastroduodenoscopy    . Gi bleed    . Ulnar nerve transposition  01/09/2012    Procedure: ULNAR NERVE DECOMPRESSION/TRANSPOSITION;  Surgeon: Kathryne Hitch, MD;  Location: [facility] OR;  Service: Orthopedics;  Laterality: Right;  Right ulnar nerve decompression at the elbow/cubital tunnel and transposition  . Inguinal hernia repair Right 04/23/2012    Procedure: HERNIA REPAIR INGUINAL ADULT;  Surgeon: Kandis Cocking, MD;  Location: [facility] ORS;  Service: General;  Laterality: Right;  . Insertion of mesh Right 04/23/2012    Procedure: INSERTION OF MESH;  Surgeon: Kandis Cocking, MD;  Location: [facility] ORS;  Service: General;  Laterality: Right;  . Tee without cardioversion N/A 08/26/2012    Procedure: TRANSESOPHAGEAL ECHOCARDIOGRAM (TEE);  Surgeon: Pricilla Riffle, MD;  Location: [facility] ENDOSCOPY;  Service: Cardiovascular;  Laterality: N/A;    Assessment & Plan Clinical Impression: Patient is a 79 y.o. year old right-handed  male with history of OA, chronic back pain, gait disorder; who was admitted 08/23/2012 with acute left-sided weakness while mowing his lawn. MRI of the brain showed acute  right MCA territory infarction. Hypokalemia 3.3 with supplement added. MRA of the head with no proximal stenosis or aneurysm. Echocardiogram with ejection fraction of 55% without emboli. Carotid Dopplers with no ICA stenosis. TEE pending. Patient did not receive TPA. Patient has a history of GIB due to ulcer and esophageal bleed (secondary to ETOH) along with thrombocytopenia-- platelet 89,000 on admission. UDS positive for opiates. Neurology services consulted and recommended low dose aspirin therapy for CVA prophylaxis. TEE with tiny PFO and BLE dopplers without evidence of DVT. Patient continues with left  inattention with poor awareness, poor insight and resistant to redirection.  Patient transferred to CIR on 08/28/2012 .    Patient currently requires min with basic self-care skills secondary to decreased visual perceptual skills, decreased attention to left and decreased awareness, decreased problem solving, decreased safety awareness and decreased memory.  Prior to hospitalization, patient could complete BADLs with modified independent . Patient was using a cane PTA and driving.   Patient will benefit from skilled intervention to increase independence with basic self-care skills prior to discharge home with care partner.  Anticipate patient will require intermittent to no supervision and follow up home health.      Skilled Therapeutic Intervention Therapy session on ADL retraining focused on attention to L, safety awareness, functional transfers, and cognitive skills. Pt received supine in bed and ready for therapy session. Pt verbalized "I need to get my balance back" however decreased safety awareness and problem solving when asked what to do when he needs to go to the bathroom. Pt on multiple times stating I use to walk to the bathroom with a cane and no help and therapist emphasizing deficits from stroke impacting his functional ability at this time. Pt required min verbal cues to attend to L side (e.g. Removing sleeve of gown from L arm). Pt reluctant to receive assist from OT with orienting shirt as he attempted to don backwards and requested assist after struggling. After OT oriented shirt pt required assist to thread LUE completely. Pt had difficulty threading LLE into pants and was not open to threading LLE prior to RLE after OT suggested it. Completed oral care and combing hair in standing with min assist for balance and all items placed on L side with pt attending to without cues. Pt required cues and min assist for upright posture in standing as he was flexing forward. Pt completed stand pivot  transfers with min assist using RW and carryover with safety for hand placement during sit<>stand.   OT Evaluation Precautions/Restrictions  Precautions Precautions: Fall Precaution Comments: cues to attend to left side  Restrictions Weight Bearing Restrictions: No General   Vital Signs Therapy Vitals Temp: 98 F (36.7 C) Pulse Rate: 50 Resp: 16 BP: 117/53 mmHg Patient Position, if appropriate: Lying Oxygen Therapy SpO2: 94 % O2 Device: None (Room air) Pain Pain Assessment Pain Assessment: No/denies pain Home Living/Prior Functioning Home Living Available Help at Discharge: Family;Available PRN/intermittently Type of Home: House Home Access: Stairs to enter [employer] of Steps: 4; 2 in garage (rails with 4 stairs and no rails in garage entrance) Entrance Stairs-Rails: Left;Right;Can reach both Home Layout: One level Additional Comments: Wife is staying with son while husband is at hospital, she has had h/o CVA. Pt reports he does not want to depend on his wife.  Lives With: Spouse ADL   Vision/Perception  Vision - History Baseline Vision: Bifocals Visual History: Cataracts Patient Visual Report: No change from baseline Vision - Assessment Eye Alignment: Within  Functional Limits Perception Inattention/Neglect: Impaired-to be further tested in functional context;Other (comment) (decreased attention to L)  Cognition Overall Cognitive Status: Impaired/Different from baseline Arousal/Alertness: Awake/alert Orientation Level: Oriented X4 Attention: Selective Selective Attention: Appears intact Memory: Appears intact Awareness: Appears intact Problem Solving: Appears intact Executive Function: Reasoning;Sequencing Reasoning: Appears intact Sequencing: Appears intact Behaviors: Impulsive Safety/Judgment: Impaired Sensation Sensation Light Touch: Appears Intact Coordination Gross Motor Movements are Fluid and Coordinated: No Fine Motor Movements are  Fluid and Coordinated: No Finger Nose Finger Test: decreased speed and slight decreased accuracy; pt reports having this problem prior to CVA Motor    Mobility     Trunk/Postural Assessment     Balance   Extremity/Trunk Assessment RUE Assessment RUE Assessment: Within Functional Limits LUE Assessment LUE Assessment: Within Functional Limits  FIM:  FIM - Grooming Grooming Steps: Wash, rinse, dry hands;Wash, rinse, dry face;Brush, comb hair Grooming: 5: Set-up assist to obtain items FIM - Upper Body Dressing/Undressing Upper body dressing/undressing steps patient completed: Thread/unthread right sleeve of pullover shirt/dresss;Pull shirt over trunk Upper body dressing/undressing: 3: Mod-Patient completed 50-74% of tasks FIM - Lower Body Dressing/Undressing Lower body dressing/undressing steps patient completed: Thread/unthread right underwear leg;Pull underwear up/down;Thread/unthread right pants leg;Pull pants up/down;Fasten/unfasten right shoe;Fasten/unfasten left shoe Lower body dressing/undressing: 3: Mod-Patient completed 50-74% of tasks FIM - Bed/Chair Transfer Bed/Chair Transfer: 4: Supine > Sit: Min A (steadying Pt. > 75%/lift 1 leg);4: Bed > Chair or W/C: Min A (steadying Pt. > 75%) FIM - Diplomatic Services operational officer Devices: Best boy Transfers: 4-To toilet/BSC: Min A (steadying Pt. > 75%);4-From toilet/BSC: Min A (steadying Pt. > 75%)   Refer to Care Plan for Long Term Goals  Recommendations for other services: None  Discharge Criteria: Patient will be discharged from OT if patient refuses treatment 3 consecutive times without medical reason, if treatment goals not met, if there is a change in medical status, if patient makes no progress towards goals or if patient is discharged from hospital.  The above assessment, treatment plan, treatment alternatives and goals were discussed and mutually agreed upon: by patient  Daneil Dan 08/29/2012, 9:44 AM

## 2012-08-29 NOTE — Progress Notes (Signed)
Occupational Therapy Session Note  Patient Details  Name: Brandon Hardy MRN: [id number] Date of Birth: Jun 12, 1927  Today's Date: 08/29/2012 Time: 1130-1200 Time Calculation (min): 30 min  Short Term Goals: Week 1:  OT Short Term Goal 1 (Week 1): Refer to LTGs d/t expected short LOS  Skilled Therapeutic Interventions/Progress Updates:  Therapy session focused on functional transfers, activity tolerance, safety awareness, and attention to L side. Pt received after PT session in therapy gym. Pt ambulated with steadying assist from therapy gym to ADL apartment with increased time and 1 verbal cue for attention to L side as he ran into side of w/c. Pt then able to navigate with RW safely in ADL apartment bathroom. Completed tub transfer with TTB however used as chair having pt step into tub using grab bars. Completed 2x with min assist and more difficulty with transferring out of tub. Pt demonstrated increased carryover of safety techniques during second transfer. Ambulated back to room with RW and pt with good awareness as he navigate between 2 close trash cans and attended to all items on L side. Pt verbalized on way back to room "Don't want to run into anything on the left." Pt left sitting in w/c with all needs and quick release belt on.   Therapy Documentation Precautions:  Precautions Precautions: Fall Precaution Comments: L inattention Restrictions Weight Bearing Restrictions: No General:   Vital Signs:   Pain: No c/o pain during therapy session.  See FIM for current functional status  Therapy/Group: Individual Therapy  Daneil Dan 08/29/2012, 12:14 PM

## 2012-08-29 NOTE — Progress Notes (Signed)
Patient ID: Brandon Hardy, male   DOB: 1927-07-12, 79 y.o.   MRN: [id number] Subjective/Complaints: 79 y.o. right-handed male with history of OA, chronic back pain, gait disorder; who was admitted 08/23/2012 with acute left-sided weakness while mowing his lawn. MRI of the brain showed acute right MCA territory infarction. Hypokalemia 3.3 with supplement added. MRA of the head with no proximal stenosis or aneurysm. Echocardiogram with ejection fraction of 55% without emboli. Carotid Dopplers with no ICA stenosis. TEE pending. Patient did not receive TPA. Patient has a history of GIB due to ulcer and esophageal bleed (secondary to ETOH) along with thrombocytopenia-- platelet 89,000 on admission. UDS positive for opiates. Neurology services consulted and recommended low dose aspirin therapy for CVA prophylaxis. TEE with tiny PFO and BLE dopplers without evidence of DVT  Slept well Feet burn but are not numb  Review of Systems  Neurological: Positive for sensory change.  All other systems reviewed and are negative.     Objective: Vital Signs: Blood pressure 117/53, pulse 50, temperature 98 F (36.7 C), temperature source Oral, resp. rate 16, height 5\' 7"  (1.702 m), weight 62.914 kg (138 lb 11.2 oz), SpO2 94.00%. No results found. Results for orders placed during the hospital encounter of 08/28/12 (from the past 72 hour(s))  CBC WITH DIFFERENTIAL     Status: Abnormal   Collection Time    08/29/12  6:00 AM      Result Value Range   WBC 5.0  4.0 - 10.5 K/uL   RBC 3.20 (*) 4.22 - 5.81 MIL/uL   Hemoglobin 10.7 (*) 13.0 - 17.0 g/dL   HCT 40.9 (*) 81.1 - 91.4 %   MCV 100.9 (*) 78.0 - 100.0 fL   MCH 33.4  26.0 - 34.0 pg   MCHC 33.1  30.0 - 36.0 g/dL   RDW 78.2  95.6 - 21.3 %   Platelets 106 (*) 150 - 400 K/uL   Comment: CONSISTENT WITH PREVIOUS RESULT   Neutrophils Relative % 51  43 - 77 %   Neutro Abs 2.5  1.7 - 7.7 K/uL   Lymphocytes Relative 33  12 - 46 %   Lymphs Abs 1.7  0.7 - 4.0 K/uL    Monocytes Relative 10  3 - 12 %   Monocytes Absolute 0.5  0.1 - 1.0 K/uL   Eosinophils Relative 5  0 - 5 %   Eosinophils Absolute 0.3  0.0 - 0.7 K/uL   Basophils Relative 0  0 - 1 %   Basophils Absolute 0.0  0.0 - 0.1 K/uL      Vitals reviewed.  Constitutional: He is oriented to person, place, and time.  HENT:  Head: Normocephalic.  Eyes: EOM are normal.  Neck: Normal range of motion. Neck supple. No thyromegaly present.  Cardiovascular: Regular rhythm.  Pulmonary/Chest: Effort normal and breath sounds normal. No respiratory distress.  Abdominal: Soft. Bowel sounds are normal. He exhibits no distension.  Neurological: He is alert and oriented to person, place, and time.  Follows commands. Very alert. Mild left central 7. Mild left pronator drift. Strength nearly symmetrical in all 4's at 4/5. No gross sensory deficits. Mild dysmetria on Left side Skin: Skin is warm and dry.  Psychiatric: He has a normal mood and affect.    Assessment/Plan: 1. Functional deficits secondary to Right MCA infarct which require 3+ hours per day of interdisciplinary therapy in a comprehensive inpatient rehab setting. Physiatrist is providing close team supervision and 24 hour management of active medical problems  listed below. Physiatrist and rehab team continue to assess barriers to discharge/monitor patient progress toward functional and medical goals. FIM: FIM - Bathing Bathing: 0: Activity did not occur  FIM - Upper Body Dressing/Undressing Upper body dressing/undressing: 0: Activity did not occur FIM - Lower Body Dressing/Undressing Lower body dressing/undressing: 0: Activity did not occur  FIM - Toileting Toileting: 0: Activity did not occur  FIM - Archivist Transfers: 0-Activity did not occur  FIM - Games developer Transfer: 0: Activity did not occur  FIM - Locomotion: Wheelchair Locomotion: Wheelchair: 0: Activity did not occur FIM - Locomotion:  Ambulation Locomotion: Ambulation: 0: Activity did not occur  Comprehension Comprehension Mode: Auditory Comprehension: 4-Understands basic 77 - 89% of the time/requires cueing 10 - 24% of the time  Expression Expression Mode: Verbal Expression: 5-Expresses basic 90% of the time/requires cueing < 10% of the time.  Social Interaction Social Interaction: 4-Interacts appropriately 75 - 89% of the time - Needs redirection for appropriate language or to initiate interaction.  Problem Solving Problem Solving: 4-Solves basic 77 - 89% of the time/requires cueing 10 - 24% of the time  Memory Memory: 5-Recognizes or recalls 90% of the time/requires cueing < 10% of the time  Medical Problem List and Plan:  1. DVT Prophylaxis/Anticoagulation: Mechanical: Sequential compression devices, below knee Bilateral lower extremities  2. Chronic back pain/ Pain Management: Cervical and lumbar post lami syndrome Was started on prednisone in the past month by Dr. Noel Gerold and this has helped some. Continue chronic steroids and neurontin.  3. Mood: Will have LCSW follow for evaluation.  4. Neuropsych: This patient is capable of making decisions on his own behalf.  5. Thrombocytopenia: Will monitor for signs of bleeding/routine check of CBC. Reports platelets normally in 80's range.  6. GERD: Protonix is ineffective and causing worsening of symptoms. Will resume home Prilosec.  7.  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

## 2012-08-29 NOTE — Care Management Note (Signed)
[facility] Individual Statement of Services  Patient Name:  Brandon Hardy  Date:  08/29/2012  Welcome to the [facility].  Our goal is to provide you with an individualized program based on your diagnosis and situation, designed to meet your specific needs.  With this comprehensive rehabilitation program, you will be expected to participate in at least 3 hours of rehabilitation therapies Monday-Friday, with modified therapy programming on the weekends.  Your rehabilitation program will include the following services:  Physical Therapy (PT), Occupational Therapy (OT), Speech Therapy (ST), 24 hour per day rehabilitation nursing, Case Management (Social Worker), Rehabilitation Medicine, Nutrition Services and Pharmacy Services  Weekly team conferences will be held on Wednesday to discuss your progress.  Your Social Worker will talk with you frequently to get your input and to update you on team discussions.  Team conferences with you and your family in attendance may also be held.  Expected length of stay: 10-12 days Overall anticipated outcome: mod/i-supervision level  Depending on your progress and recovery, your program may change. Your Social Worker will coordinate services and will keep you informed of any changes. Your Social Worker's name and contact numbers are listed  below.  The following services may also be recommended but are not provided by the [facility]:   Driving Evaluations  Home Health Rehabiltiation Services  Outpatient Rehabilitation Services    Arrangements will be made to provide these services after discharge if needed.  Arrangements include referral to agencies that provide these services.  Your insurance has been verified to be: [employer]Med[employer] Your primary doctor is:  Dr Juluis Rainier  Pertinent information will be shared with your doctor and your insurance company.  Social Worker:  Dossie Der, SW  [phone] or [phone]  Information discussed with and copy given to patient by: Lucy Chris, 08/29/2012, 9:49 AM

## 2012-08-29 NOTE — Evaluation (Signed)
Physical Therapy Assessment and Plan  Patient Details  Name: Brandon Hardy MRN: [id number] Date of Birth: Jun 23, 1927  PT Diagnosis: Abnormal posture, Abnormality of gait, Coordination disorder, Hemiparesis non-dominant, Low back pain and Muscle weakness Rehab Potential: Good ELOS: 10-12 days   Today's Date: 08/29/2012 Time: 1030-1130 Time Calculation (min): 60 min  Problem List:  Patient Active Problem List   Diagnosis Date Noted  . Thrombocytopenia, unspecified 08/28/2012  . Chronic back pain 08/28/2012  . PFO (patent foramen ovale) 08/28/2012  . Unspecified constipation 08/28/2012  . Acute left-sided muscle weakness 08/23/2012  . Hypokalemia 08/23/2012  . CVA (cerebral infarction) 08/23/2012  . Cubital tunnel syndrome on right 01/09/2012  . Right groin pain 09/19/2011  . Gait disturbance 07/30/2011  . Fall 07/30/2011  . Humerus fracture 07/30/2011  . GERD (gastroesophageal reflux disease) 07/30/2011  . BPH (benign prostatic hyperplasia) 07/30/2011  . Leg edema 07/30/2011    Past Medical History:  Past Medical History  Diagnosis Date  . Arthritis   . Blood transfusion   . Renal disorder   . Thrombocytopenia   . Heart murmur   . Chronic back pain   . Peripheral edema     was on Lasix but has been off for about 3months  . GERD (gastroesophageal reflux disease)     takes Prilosec daily  . GI bleed     hx of   . Constipation     takes Senna daily and eats Metamucil daily  . Urinary frequency   . History of kidney stones   . History of blood transfusion     no abnormal reaction noted  . Cataracts, bilateral     immature  . History of blood clots     left leg  . History of Clostridium difficile infection   . BPH (benign prostatic hyperplasia)   . Stroke 08/22/2012  . Esophageal varices with bleeding approx 20 years ago    per pt history  . Thrombocytopenia, unspecified 08/28/2012  . PFO (patent foramen ovale) 08/28/2012    Per TEE 08/2012   Past Surgical  History:  Past Surgical History  Procedure Laterality Date  . Fracture surgery    . Back surgery      multiple  . Neck surgery  2013    x 2/plates, screws, decreased mobility  . Left finger surgery    . Foot surgery    . Colonoscopy    . Esophagogastroduodenoscopy    . Gi bleed    . Ulnar nerve transposition  01/09/2012    Procedure: ULNAR NERVE DECOMPRESSION/TRANSPOSITION;  Surgeon: Kathryne Hitch, MD;  Location: [facility] OR;  Service: Orthopedics;  Laterality: Right;  Right ulnar nerve decompression at the elbow/cubital tunnel and transposition  . Inguinal hernia repair Right 04/23/2012    Procedure: HERNIA REPAIR INGUINAL ADULT;  Surgeon: Kandis Cocking, MD;  Location: [facility] ORS;  Service: General;  Laterality: Right;  . Insertion of mesh Right 04/23/2012    Procedure: INSERTION OF MESH;  Surgeon: Kandis Cocking, MD;  Location: [facility] ORS;  Service: General;  Laterality: Right;  . Tee without cardioversion N/A 08/26/2012    Procedure: TRANSESOPHAGEAL ECHOCARDIOGRAM (TEE);  Surgeon: Pricilla Riffle, MD;  Location: [facility] ENDOSCOPY;  Service: Cardiovascular;  Laterality: N/A;    Assessment & Plan Clinical Impression: Brandon Hardy is a 79 y.o. right-handed male with history of OA, chronic back pain, gait disorder; who was admitted 08/23/2012 with acute left-sided weakness while mowing his lawn. MRI of the brain  showed acute right MCA territory infarction. Hypokalemia 3.3 with supplement added. MRA of the head with no proximal stenosis or aneurysm. Echocardiogram with ejection fraction of 55% without emboli. Carotid Dopplers with no ICA stenosis. TEE pending. Patient did not receive TPA. Patient has a history of GIB due to ulcer and esophageal bleed (secondary to ETOH) along with thrombocytopenia-- platelet 89,000 on admission. UDS positive for opiates. Neurology services consulted and recommended low dose aspirin therapy for CVA prophylaxis. TEE with tiny PFO and BLE dopplers without evidence of DVT.  Patient continues with left inattention with poor awareness, poor insight and resistant to redirection. Therapy team recommended CIR and patient admitted today. Patient transferred to CIR on 08/28/2012 .   Patient currently requires min with mobility secondary to muscle weakness, decreased cardiorespiratoy endurance and impaired timing and sequencing, abnormal tone and decreased coordination.  Prior to hospitalization, patient was modified independent  with mobility and lived with Spouse in a House home.  Home access is 4 stairs in front with B handrails; 2 stairs in garage-pt reports stairs are very wide and he can place RW on each stepStairs to enter.  Patient will benefit from skilled PT intervention to maximize safe functional mobility, minimize fall risk and decrease caregiver burden for planned discharge home with intermittent assist.  Anticipate patient will benefit from follow up [facility] at discharge.  PT - End of Session Activity Tolerance: Tolerates 30+ min activity without fatigue Endurance Deficit: No PT Assessment Rehab Potential: Good Barriers to Discharge: Decreased caregiver support (unsure if patient's wife can provide support?) PT Patient demonstrates impairments in the following area(s): Balance;Endurance;Motor;Safety PT Transfers Functional Problem(s): Bed Mobility;Bed to Chair;Car;Furniture;Floor PT Locomotion Functional Problem(s): Ambulation;Wheelchair Mobility;Stairs PT Plan PT Intensity: Minimum of 1-2 x/day ,45 to 90 minutes PT Frequency: 5 out of 7 days PT Duration Estimated Length of Stay: 10-12 days PT Treatment/Interventions: Ambulation/gait training;Balance/vestibular training;Community reintegration;Discharge planning;DME/adaptive equipment instruction;Functional mobility training;Neuromuscular re-education;Patient/family education;Psychosocial support;Stair training;Therapeutic Exercise;Therapeutic Activities;UE/LE Strength taining/ROM;UE/LE Coordination  activities;Wheelchair propulsion/positioning PT Transfers Anticipated Outcome(s): Mod I PT Locomotion Anticipated Outcome(s): Mod I PT Recommendation Recommendations for Other Services: Speech consult Follow Up Recommendations: Home health PT Patient destination: Home  Skilled Therapeutic Intervention Skilled therapeutic intervention initiated after completion of evaluation. Patient engaged in rolling to B sides on flat mat and into prone position then prone on elbows. Patient able to maintain prone on elbows for approx 15" before he c/o LBP and exercise discontinued. Additionally, patient decline to perform bridging exercise, stating "those exercises caused me to have a hernia last time I was in therapy." Patient instructed in gait training x70' with RW and min guard in controlled environment then 25' x2 on carpeted surface with RW and min guard. Patient performed transfer to low positioned, cushioned couch and requires min guard for stand>sit, min A for sit>stand. Patient performed x6 sit<>stand transfers from mat without use of RW. Patient requires minA, but upon standing, has LOB anteriorly during every trial without UE support on RW. Patient handed off to OT while seated edge of mat.  PT Evaluation Precautions/Restrictions Precautions Precautions: Fall Precaution Comments: L inattention Restrictions Weight Bearing Restrictions: No General Chart Reviewed: Yes Family/Caregiver Present: No  Pain Pain Assessment Pain Assessment: No/denies pain Pain Score: 0-No pain Home Living/Prior Functioning Home Living Available Help at Discharge: Family;Available PRN/intermittently Type of Home: House Home Access: Stairs to enter [employer] of Steps: 4 stairs in front with B handrails; 2 stairs in garage-pt reports stairs are very wide and he can place RW  on each step Entrance Stairs-Rails: Left;Right;Can reach both Home Layout: One level Additional Comments: Wife is staying with  son while husband is at hospital, she has had h/o CVA. Pt reports he does not want to depend on his wife.  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 History: Cataracts Patient Visual Report: No change from baseline Vision - Assessment Eye Alignment: Within Functional Limits Perception Perception: Impaired Inattention/Neglect: Does not attend to left visual field  Cognition Overall Cognitive Status: Impaired/Different from baseline Arousal/Alertness: Awake/alert Orientation Level: Oriented X4 Attention: Selective Selective Attention: Appears intact Memory: Appears intact Awareness: Appears intact Problem Solving: Appears intact Executive Function: Reasoning;Sequencing Reasoning: Appears intact Sequencing: Appears intact Behaviors: Impulsive Safety/Judgment: Impaired Sensation Sensation Light Touch: Appears Intact Proprioception: Appears Intact Additional Comments: Proprioception intact B ankles and great toes. Coordination Gross Motor Movements are Fluid and Coordinated: Yes Fine Motor Movements are Fluid and Coordinated: No Coordination and Movement Description: decreased speed and accuracy with rapid, alternating movements Finger Nose Finger Test: decreased speed and slight decreased accuracy; pt reports having this problem prior to CVA Heel Shin Test: slight dysmetria L LE Motor  Motor Motor: Abnormal postural alignment and control;Abnormal tone Motor - Skilled Clinical Observations: Mild hemiparesis L side  Mobility Bed Mobility Bed Mobility: Rolling Right;Rolling Left;Supine to Sit;Sitting - Scoot to Edge of Bed;Sit to Supine Rolling Right: 5: Supervision Rolling Right Details: Verbal cues for sequencing Rolling Left: 5: Supervision Rolling Left Details: Verbal cues for  sequencing Supine to Sit: HOB flat;5: Supervision Supine to Sit Details: Verbal cues for sequencing Sitting - Scoot to Edge of Bed: 5: Supervision Sit to Supine: HOB flat;5: Supervision Transfers Transfers: Yes Sit to Stand: 4: Min assist;From chair/3-in-1;With upper extremity assist;With armrests Sit to Stand Details: Verbal cues for precautions/safety;Verbal cues for sequencing;Verbal cues for technique;Manual facilitation for weight shifting Sit to Stand Details (indicate cue type and reason): Patient requires minA secondary to posterior lean. Stand to Sit: 4: Min guard;With upper extremity assist;With armrests;To chair/3-in-1 Stand Pivot Transfers: 4: Min assist;With armrests Stand Pivot Transfer Details: Verbal cues for sequencing;Verbal cues for precautions/safety Locomotion  Ambulation Ambulation: Yes Ambulation/Gait Assistance: 4: Min assist;4: Min guard;5: Supervision Ambulation Distance (Feet): 173 Feet Assistive device: Rolling walker Ambulation/Gait Assistance Details: Verbal cues for gait pattern;Verbal cues for precautions/safety;Tactile cues for weight shifting Ambulation/Gait Assistance Details: Patient instructed in gait training 173' x1 with RW and minA initially, quickly progressing to min guard, then supervision. Patient initially requires minA secondary to decreased step and stride length, but improves as gait training proceeds. Patient performed gait training in controlled environment with little distractions. Patient appears to tend to L visual field and does not run into any obstacles, doorways, etc. Requires verbal and tactile cues for increased weight shift to the L. Gait Gait: Yes Gait Pattern: Impaired Gait Pattern: Decreased stride length;Step-through pattern;Decreased step length - right;Decreased step length - left;Trunk flexed;Narrow base of support;Decreased weight shift to left Stairs / Additional Locomotion Stairs: Yes Stairs Assistance: 4: Min  assist Stairs Assistance Details: Verbal cues for precautions/safety;Verbal cues for sequencing;Verbal cues for technique Stairs Assistance Details (indicate cue type and reason): Patient requires verbal cues for whole foot on step with ascending. Requires verbal cues to ascend with R LE, but patient recalls to descend with L LE. Stair Management Technique: Two rails;Step to pattern;Forwards Number of Stairs: 5 Height of  Stairs: 6 Curb: 4: Min assist (with RW) Naval architect Mobility: No  Trunk/Postural Assessment  Cervical Assessment Cervical Assessment: Exceptions to [facility] (forward head posture) Thoracic Assessment Thoracic Assessment: Within Functional Limits Lumbar Assessment Lumbar Assessment: Within Functional Limits Postural Control Postural Control: Within Functional Limits  Balance Balance Balance Assessed: Yes Static Sitting Balance Static Sitting - Balance Support: Feet supported;No upper extremity supported Static Sitting - Level of Assistance: 6: Modified independent (Device/Increase time) Static Standing Balance Static Standing - Balance Support: Bilateral upper extremity supported;During functional activity Static Standing - Level of Assistance: 4: Min assist;5: Stand by assistance Static Standing - Comment/# of Minutes: 2 min; minA progressing to supervision Extremity Assessment  RUE Assessment RUE Assessment: Within Functional Limits LUE Assessment LUE Assessment: Within Functional Limits RLE Assessment RLE Assessment:  (grossly 4/5) LLE Assessment LLE Assessment: Within Functional Limits (grossly 4/5)  FIM:  FIM - Banker Devices: Walker;Arm rests Bed/Chair Transfer: 5: Supine > Sit: Supervision (verbal cues/safety issues);5: Sit > Supine: Supervision (verbal cues/safety issues);4: Chair or W/C > Bed: Min A (steadying Pt. > 75%);4: Bed > Chair or W/C: Min A (steadying Pt. > 75%) FIM - Locomotion:  Wheelchair Locomotion: Wheelchair: 0: Activity did not occur FIM - Locomotion: Ambulation Locomotion: Ambulation Assistive Devices: Designer, industrial/product Ambulation/Gait Assistance: 4: Min assist;4: Min guard;5: Supervision Locomotion: Ambulation: 4: Travels 150 ft or more with minimal assistance (Pt.>75%) FIM - Locomotion: Stairs Locomotion: Building control surveyor: Hand rail - 2 Locomotion: Stairs: 2: Up and Down 4 - 11 stairs with minimal assistance (Pt.>75%)   Refer to Care Plan for Long Term Goals  Recommendations for other services: None  Discharge Criteria: Patient will be discharged from PT if patient refuses treatment 3 consecutive times without medical reason, if treatment goals not met, if there is a change in medical status, if patient makes no progress towards goals or if patient is discharged from hospital.  The above assessment, treatment plan, treatment alternatives and goals were discussed and mutually agreed upon: by patient  Chipper Herb. Demontay Grantham, PT, DPT 08/29/2012, 11:54 AM

## 2012-08-30 ENCOUNTER — Inpatient Hospital Stay (HOSPITAL_COMMUNITY): Payer: Medicare Other | Admitting: *Deleted

## 2012-08-30 ENCOUNTER — Inpatient Hospital Stay (HOSPITAL_COMMUNITY): Payer: Medicare Other

## 2012-08-30 DIAGNOSIS — F1010 Alcohol abuse, uncomplicated: Secondary | ICD-10-CM | POA: Diagnosis present

## 2012-08-30 NOTE — IPOC Note (Signed)
Overall Plan of Care [facility]) Patient Details Name: Brandon Hardy MRN: [id number] DOB: 09/26/1927  Admitting Diagnosis: R CVA  Hospital Problems: Principal Problem:   CVA (cerebral infarction) Active Problems:   GERD (gastroesophageal reflux disease)   Thrombocytopenia, unspecified   Chronic back pain   Co-morbidities: Remote history of breath and all of use, chronic pain, chronic cervical myelopathy with upper extremity weakness and contractures   Functional Problem List: Nursing Bladder;Bowel;Endurance;Medication Management;Nutrition;Pain;Safety;Sensory;Skin Integrity  PT Balance;Endurance;Motor;Safety  OT Balance;Cognition;Motor;Perception;Safety;Sensory;Vision;Behavior  SLP    TR         Basic ADL's: OT Grooming;Bathing;Dressing;Toileting     Advanced  ADL's: OT Light Housekeeping     Transfers: PT Bed Mobility;Bed to Chair;Car;Furniture;Floor  OT Tub/Shower;Toilet     Locomotion: PT Ambulation;Wheelchair Mobility;Stairs     Additional Impairments: OT None  SLP        TR      Anticipated Outcomes Item Anticipated Outcome  Self Feeding Mod I   Swallowing      Basic self-care  Mod I   Toileting  Mod I    Bathroom Transfers Mod I   Bowel/Bladder  continent with toileting modified independence  Transfers  Mod I  Locomotion  Mod I  Communication     Cognition     Pain  3 or less on scale of 10  Safety/Judgment  supervision   Therapy Plan: PT Intensity: Minimum of 1-2 x/day ,45 to 90 minutes PT Frequency: 5 out of 7 days PT Duration Estimated Length of Stay: 10-12 days OT Intensity: Minimum of 1-2 x/day, 45 to 90 minutes OT Frequency: 5 out of 7 days OT Duration/Estimated Length of Stay: 10-12 days          Team Interventions: Nursing Interventions Patient/Family Education;Bladder Management;Bowel Management;Disease Management/Prevention;Medication Management;Pain Management;Skin Care/Wound Management;Discharge Planning;Psychosocial  Support  PT interventions Ambulation/gait training;Balance/vestibular training;Community reintegration;Discharge planning;DME/adaptive equipment instruction;Functional mobility training;Neuromuscular re-education;Patient/family education;Psychosocial support;Stair training;Therapeutic Exercise;Therapeutic Activities;UE/LE Strength taining/ROM;UE/LE Coordination activities;Wheelchair propulsion/positioning  OT Interventions Balance/vestibular training;Cognitive remediation/compensation;Community reintegration;Discharge planning;DME/adaptive equipment instruction;Functional mobility training;Neuromuscular re-education;Psychosocial support;Patient/family education;Self Care/advanced ADL retraining;Therapeutic Activities;Visual/perceptual remediation/compensation;UE/LE Strength taining/ROM;UE/LE Coordination activities;Therapeutic Exercise  SLP Interventions    TR Interventions    SW/CM Interventions Discharge Planning;Patient/Family Education;Psychosocial Support    Team Discharge Planning: Destination: PT-Home ,OT- Home , SLP-  Projected Follow-up: PT-Home health PT, OT-  Home health OT, SLP-  Projected Equipment Needs: PT- , OT- None recommended by OT, SLP-  Patient/family involved in discharge planning: PT- Patient,  OT-Patient, SLP-   MD ELOS: 7-10 days Medical Rehab Prognosis:  Good Assessment: 79 year old male with prior history of cervical myelopathy, spinal cord changes at C3-C4 was admitted for acute CVA. Now requiring 24/7 Rehab RN,MD, as well as CIR level PT, OT .  Treatment team will focus on ADLs and mobility with goals set at Mod I.   See Team Conference Notes for weekly updates to the plan of care

## 2012-08-30 NOTE — Progress Notes (Signed)
Physical Therapy Session Note  Patient Details  Name: Brandon Hardy MRN: [id number] Date of Birth: 1927-06-28  Today's Date: 08/30/2012 Time: 1035-1130 and 3244-0102 Time Calculation (min): 55 min and 47 min  Short Term Goals: Week 1:  PT Short Term Goal 1 (Week 1): Patient will perform bed mobliity with mod I. PT Short Term Goal 2 (Week 1): Patient will perform stand pivot transfers with RW and supervision. PT Short Term Goal 3 (Week 1): Patient will perform gait training x150' in controlled environment with RW and supervision. PT Short Term Goal 4 (Week 1): Patient will negotiate 4 stairs with B handrails and supervision. PT Short Term Goal 5 (Week 1): Patiet will negotiate 2 steps without handrails and with RW and supervision.  Skilled Therapeutic Interventions/Progress Updates:    AM Session: Patient received sitting in recliner. Session focused on gait training, RW management with gait training, and sit<>stand transfers; see details below. Patient performed x2 trials obstacle course, requiring him to weave in/out of 4 cones and 2 bolsters, all positioned closely to the wall to facilitate attention to the L and RW management with negotiation of obstacles. Patient performed with supervision, 2 instances of min guard when turning. Patient returned to room and left sitting in recliner with seatbelt donned and all needs within reach.  PM Session: Patient received sitting in recliner. Session focused on gait training, unsupported standing dynamic balance and negotiation of/stepping over obstacles with RW. See details below for standing dynamic balance activities. Noted patient appears to have increased awareness of balance deficits when UE support removed.  Patient performed gait training requiring him to step over 3 bolsters. Provided patient visual demonstration for sequencing and technique. Patient performed 2 trials with min guard and no overt LOB. Patient returned to room and left sitting  in recliner with all needs within reach. Trial without safety belt and patient able to verbalize that he needs to call for assistance if he needs to get up for any reason. RN notified and in agreement.  Therapy Documentation Precautions:  Precautions Precautions: Fall Precaution Comments: L inattention Restrictions Weight Bearing Restrictions: No General:   Vital Signs:   Pain: Pain Assessment Pain Assessment: No/denies pain Pain Score: 0-No pain Locomotion : Ambulation Ambulation: Yes Ambulation/Gait Assistance: 5: Supervision;4: Min guard;4: Min assist Ambulation Distance (Feet): 175 Feetx1 in AM; 185 x2 in PM Assistive device: Rolling walker;Straight cane Ambulation/Gait Assistance Details: Verbal cues for gait pattern;Verbal cues for precautions/safety;Tactile cues for weight shifting Ambulation/Gait Assistance Details: Patient instructed in gait training 175' x1 with RW and S in controlled environment. Patient performed gait 20' x2 with SPC and minA; noted significantly decreased step/stride length as compared to with RW, wide BOS, increased lateral trunk sway, and patient reaching for various stable objects. Patient then performed gait training in gift shop with RW and supervision-min guard to facilitate gait training in confined spaces. Patient with good RW management and negotiation of obstacles in confined spaces. Gait Gait: Yes Gait Pattern: Impaired Gait Pattern: Decreased stride length;Step-through pattern;Decreased step length - right;Decreased step length - left;Trunk flexed;Narrow base of support;Decreased weight shift to left  Balance: Balance Balance Assessed: Yes Dynamic Standing Balance Dynamic Standing - Balance Support: No upper extremity supported Dynamic Standing - Level of Assistance: 3: Mod assist Dynamic Standing - Balance Activities: Lateral lean/weight shifting;Forward lean/weight shifting;Reaching for objects;Reaching for weighted objects Dynamic  Standing - Comments: Patient performed 2 rounds of reaching for 7 horseshoes on elevated basketball hoop positioned on his L and placing  them on rolling table positioned on his R, and then back on hoop. Patient without UE support and requires modA for activity. Patient picked up 3 horseshoes and 2 cones from floor with min-modA; emphasis on stepping closer to objects prior to attempting to pick them up. Patient appears to have increased awareness of balance deficits when UE support removed. Other Treatments: Treatments Therapeutic Activity: x10 sit<>stand transfers with RW from wheelchair. Emphasis on scooting to edge of seat and not using B LEs to brace on wheelchair.  See FIM for current functional status  Therapy/Group: Individual Therapy  Chipper Herb. Christoper Bushey, PT, DPT 08/30/2012, 3:36 PM

## 2012-08-30 NOTE — Progress Notes (Signed)
Patient ID: Brandon Hardy, male   DOB: [birth year], 79 y.o.   MRN: [id number] Subjective/Complaints: 79 y.o. right-handed male with history of OA, chronic back pain, gait disorder; who was admitted 08/23/2012 with acute left-sided weakness while mowing his lawn. MRI of the brain showed acute right MCA territory infarction. Hypokalemia 3.3 with supplement added. MRA of the head with no proximal stenosis or aneurysm. Echocardiogram with ejection fraction of 55% without emboli. Carotid Dopplers with no ICA stenosis. TEE pending. Patient did not receive TPA. Patient has a history of GIB due to ulcer and esophageal bleed (secondary to ETOH) along with thrombocytopenia-- platelet 89,000 on admission. UDS positive for opiates. Neurology services consulted and recommended low dose aspirin therapy for CVA prophylaxis. TEE with tiny PFO and BLE dopplers without evidence of DVT  No new issues States EtOH abuse was remote On chronic narcotics for spinal stenosis  Review of Systems  Neurological: Positive for sensory change.  All other systems reviewed and are negative.     Objective: Vital Signs: Blood pressure 136/62, pulse 53, temperature 98.5 F (36.9 C), temperature source Oral, resp. rate 16, height 5\' 7"  (1.702 m), weight 62.914 kg (138 lb 11.2 oz), SpO2 96.00%. No results found. Results for orders placed during the hospital encounter of 08/28/12 (from the past 72 hour(s))  CBC WITH DIFFERENTIAL     Status: Abnormal   Collection Time    08/29/12  6:00 AM      Result Value Range   WBC 5.0  4.0 - 10.5 K/uL   RBC 3.20 (*) 4.22 - 5.81 MIL/uL   Hemoglobin 10.7 (*) 13.0 - 17.0 g/dL   HCT 57.8 (*) 46.9 - 62.9 %   MCV 100.9 (*) 78.0 - 100.0 fL   MCH 33.4  26.0 - 34.0 pg   MCHC 33.1  30.0 - 36.0 g/dL   RDW 52.8  41.3 - 24.4 %   Platelets 106 (*) 150 - 400 K/uL   Comment: CONSISTENT WITH PREVIOUS RESULT   Neutrophils Relative % 51  43 - 77 %   Neutro Abs 2.5  1.7 - 7.7 K/uL   Lymphocytes Relative  33  12 - 46 %   Lymphs Abs 1.7  0.7 - 4.0 K/uL   Monocytes Relative 10  3 - 12 %   Monocytes Absolute 0.5  0.1 - 1.0 K/uL   Eosinophils Relative 5  0 - 5 %   Eosinophils Absolute 0.3  0.0 - 0.7 K/uL   Basophils Relative 0  0 - 1 %   Basophils Absolute 0.0  0.0 - 0.1 K/uL  COMPREHENSIVE METABOLIC PANEL     Status: Abnormal   Collection Time    08/29/12  6:00 AM      Result Value Range   Sodium 140  135 - 145 mEq/L   Potassium 4.0  3.5 - 5.1 mEq/L   Chloride 108  96 - 112 mEq/L   CO2 26  19 - 32 mEq/L   Glucose, Bld 90  70 - 99 mg/dL   BUN 22  6 - 23 mg/dL   Creatinine, Ser 0.10  0.50 - 1.35 mg/dL   Calcium 8.5  8.4 - 27.2 mg/dL   Total Protein 5.3 (*) 6.0 - 8.3 g/dL   Albumin 2.3 (*) 3.5 - 5.2 g/dL   AST 23  0 - 37 U/L   ALT 12  0 - 53 U/L   Alkaline Phosphatase 62  39 - 117 U/L   Total Bilirubin 0.6  0.3 - 1.2 mg/dL   GFR calc non Af Amer 64 (*) >90 mL/min   GFR calc Af Amer 74 (*) >90 mL/min   Comment: (NOTE)     The eGFR has been calculated using the CKD EPI equation.     This calculation has not been validated in all clinical situations.     eGFR's persistently <90 mL/min signify possible Chronic Kidney     Disease.      Vitals reviewed.  Constitutional: He is oriented to person, place, and time.  HENT:  Head: Normocephalic.  Eyes: EOM are normal.  Neck: Normal range of motion. Neck supple. No thyromegaly present.  Cardiovascular: Regular rhythm.  Pulmonary/Chest: Effort normal and breath sounds normal. No respiratory distress.  Abdominal: Soft. Bowel sounds are normal. He exhibits no distension.  Neurological: He is alert and oriented to person, place, and time.  Follows commands. Very alert. Mild left central 7. Mild left pronator drift. Strength nearly symmetrical in all 4's at 4/5. No gross sensory deficits. Mild dysmetria on Left side Skin: Skin is warm and dry.  Psychiatric: He has a normal mood and affect.    Assessment/Plan: 1. Functional deficits  secondary to Right MCA infarct which require 3+ hours per day of interdisciplinary therapy in a comprehensive inpatient rehab setting. Physiatrist is providing close team supervision and 24 hour management of active medical problems listed below. Physiatrist and rehab team continue to assess barriers to discharge/monitor patient progress toward functional and medical goals. FIM: FIM - Bathing Bathing: 0: Activity did not occur  FIM - Upper Body Dressing/Undressing Upper body dressing/undressing steps patient completed: Thread/unthread right sleeve of pullover shirt/dresss;Pull shirt over trunk Upper body dressing/undressing: 3: Mod-Patient completed 50-74% of tasks FIM - Lower Body Dressing/Undressing Lower body dressing/undressing steps patient completed: Thread/unthread right underwear leg;Pull underwear up/down;Thread/unthread right pants leg;Pull pants up/down;Fasten/unfasten right shoe;Fasten/unfasten left shoe Lower body dressing/undressing: 3: Mod-Patient completed 50-74% of tasks  FIM - Toileting Toileting: 0: Activity did not occur  FIM - Diplomatic Services operational officer Devices: Best boy Transfers: 4-To toilet/BSC: Min A (steadying Pt. > 75%);4-From toilet/BSC: Min A (steadying Pt. > 75%)  FIM - Banker Devices: Walker;Arm rests Bed/Chair Transfer: 4: Bed > Chair or W/C: Min A (steadying Pt. > 75%);4: Chair or W/C > Bed: Min A (steadying Pt. > 75%)  FIM - Locomotion: Wheelchair Locomotion: Wheelchair: 0: Activity did not occur FIM - Locomotion: Ambulation Locomotion: Ambulation Assistive Devices: Designer, industrial/product Ambulation/Gait Assistance: 4: Min guard Locomotion: Ambulation: 4: Travels 150 ft or more with minimal assistance (Pt.>75%)  Comprehension Comprehension Mode: Auditory Comprehension: 4-Understands basic 75 - 89% of the time/requires cueing 10 - 24% of the time  Expression Expression Mode:  Verbal Expression: 5-Expresses basic 90% of the time/requires cueing < 10% of the time.  Social Interaction Social Interaction: 4-Interacts appropriately 75 - 89% of the time - Needs redirection for appropriate language or to initiate interaction.  Problem Solving Problem Solving: 4-Solves basic 75 - 89% of the time/requires cueing 10 - 24% of the time  Memory Memory: 5-Recognizes or recalls 90% of the time/requires cueing < 10% of the time  Medical Problem List and Plan:  1. DVT Prophylaxis/Anticoagulation: Mechanical: Sequential compression devices, below knee Bilateral lower extremities  2. Chronic back pain/ Pain Management: Cervical and lumbar post lami syndrome Was started on prednisone in the past month by Dr. Noel Gerold and this has helped some. Continue chronic steroids and neurontin.  3. Mood:  Will have LCSW follow for evaluation.  4. Neuropsych: This patient is capable of making decisions on his own behalf.  5. Thrombocytopenia: Will monitor for signs of bleeding/routine check of CBC. Reports platelets normally in 80's range.  6. GERD: Protonix is ineffective and causing worsening of symptoms. Will resume home Prilosec.  7.  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

## 2012-08-30 NOTE — Plan of Care (Signed)
Problem: RH SKIN INTEGRITY Goal: RH STG SKIN FREE OF INFECTION/BREAKDOWN Patient Will be free from skin breakdown and infection while on rehab with min assist  Outcome: Not Progressing Skin tear to left elbow. Goal: RH STG MAINTAIN SKIN INTEGRITY WITH ASSISTANCE STG Maintain Skin Integrity With Min Assistance.  Outcome: Not Progressing Skin tear to left elbow.

## 2012-08-30 NOTE — Progress Notes (Signed)
Occupational Therapy Session Note  Patient Details  Name: Brandon Hardy MRN: [id number] Date of Birth: 15-May-1927  Today's Date: 08/30/2012 Time: 0930-1030 and 1130-1200 Time Calculation (min): 60 min and 30 min   Short Term Goals: Week 1:  OT Short Term Goal 1 (Week 1): Refer to LTGs d/t expected short LOS  Skilled Therapeutic Interventions/Progress Updates:    Session 1: Therapy session focused on ADL retraining, safety awareness, and functional transfers. Pt received sitting in recliner chair. Ambulated from room into bathroom with steadying assist using RW and pt required only 1 verbal cue to attend to L side of RW. Complete toilet transfer and task with steadying assist. Completed wet shower transfer min assist and min verbal cues for technique. Required min assist for LB dressing to thread LLE and verbal cues for crossover tech. Pt required min assist to orient shirt and locate LUE sleeve when donning. Pt required increased time for self-care tasks. Pt ambulated back to recliner chair with steadying assist and good awareness of items around him.   Session 2: Therapy session focused on safety awareness, dynamic standing balance and UE coordination. Pt ambulated room<>therapy gym with steadying assist-supervision and no verbal cues for attention to left. Engaged in Wii bowling game in standing. Pt holding onto RW with LUE and using RUE to control remote. Pt declining releasing RW whenever asked. Pt completed activity with min-supervision and no lob. Pt had difficulty with coordination of RUE to bring control straight forward and decreased FM coordination.   Therapy Documentation Precautions:  Precautions Precautions: Fall Precaution Comments: L inattention Restrictions Weight Bearing Restrictions: No General:   Vital Signs:   Pain: No c/o pain during therapy sessions.  See FIM for current functional status  Therapy/Group: Individual Therapy  Daneil Dan 08/30/2012,  12:10 PM

## 2012-08-31 ENCOUNTER — Inpatient Hospital Stay (HOSPITAL_COMMUNITY): Payer: Medicare Other | Admitting: Physical Therapy

## 2012-08-31 ENCOUNTER — Inpatient Hospital Stay (HOSPITAL_COMMUNITY): Payer: Medicare Other

## 2012-08-31 DIAGNOSIS — I6350 Cerebral infarction due to unspecified occlusion or stenosis of unspecified cerebral artery: Secondary | ICD-10-CM

## 2012-08-31 DIAGNOSIS — K5900 Constipation, unspecified: Secondary | ICD-10-CM

## 2012-08-31 DIAGNOSIS — R269 Unspecified abnormalities of gait and mobility: Secondary | ICD-10-CM

## 2012-08-31 MED ORDER — MAGNESIUM HYDROXIDE 400 MG/5ML PO SUSP
15.0000 mL | Freq: Every day | ORAL | Status: DC | PRN
  Administered 2012-09-01 – 2012-09-02 (×2): 15 mL via ORAL
  Filled 2012-08-31 (×2): qty 30

## 2012-08-31 NOTE — Progress Notes (Signed)
LUKEN SHADOWENS is a 79 y.o. male April 13, 1927 [id number]  Subjective: No new complaints. No new problems. Slept well. Feeling OK. Stool: q 3 days  Objective: Vital signs in last 24 hours: Temp:  [98.2 F (36.8 C)-98.4 F (36.9 C)] 98.2 F (36.8 C) (08/30 0635) Pulse Rate:  [50-56] 50 (08/30 0635) Resp:  [17-18] 18 (08/30 0635) BP: (100-108)/(46-58) 100/46 mmHg (08/30 0635) SpO2:  [92 %-96 %] 96 % (08/30 4696) Weight change:  Last BM Date: 08/29/12  Intake/Output from previous day: 08/29 0701 - 08/30 0700 In: 960 [P.O.:960] Out: 1 [Urine:1] Last cbgs: CBG (last 3)  No results found for this basename: GLUCAP,  in the last 72 hours   Physical Exam General: No apparent distress    HEENT: moist mucosa Lungs: Normal effort. Lungs clear to auscultation, no crackles or wheezes. Cardiovascular: Regular rate and rhythm, no edema Musculoskeletal:  No change from before Neurological: No new neurological deficits Wounds: N/A    Skin: clear Alert, cooperative   Lab Results: BMET    Component Value Date/Time   NA 140 08/29/2012 0600   K 4.0 08/29/2012 0600   CL 108 08/29/2012 0600   CO2 26 08/29/2012 0600   GLUCOSE 90 08/29/2012 0600   BUN 22 08/29/2012 0600   CREATININE 1.05 08/29/2012 0600   CALCIUM 8.5 08/29/2012 0600   GFRNONAA 64* 08/29/2012 0600   GFRAA 74* 08/29/2012 0600   CBC    Component Value Date/Time   WBC 5.0 08/29/2012 0600   RBC 3.20* 08/29/2012 0600   HGB 10.7* 08/29/2012 0600   HCT 32.3* 08/29/2012 0600   PLT 106* 08/29/2012 0600   MCV 100.9* 08/29/2012 0600   MCH 33.4 08/29/2012 0600   MCHC 33.1 08/29/2012 0600   RDW 13.6 08/29/2012 0600   LYMPHSABS 1.7 08/29/2012 0600   MONOABS 0.5 08/29/2012 0600   EOSABS 0.3 08/29/2012 0600   BASOSABS 0.0 08/29/2012 0600    Studies/Results: No results found.  Medications: I have reviewed the patient's current medications.  Assessment/Plan:  1. DVT Prophylaxis/Anticoagulation: Mechanical: Sequential compression devices,  below knee Bilateral lower extremities  2. Chronic back pain/ Pain Management: Cervical and lumbar post lami syndrome Was started on prednisone in the past month by Dr. Noel Gerold and this has helped some. Continue chronic steroids and neurontin.  3. Mood: Will have LCSW follow for evaluation.  4. Neuropsych: This patient is capable of making decisions on his own behalf.  5. Thrombocytopenia: Will monitor for signs of bleeding/routine check of CBC. Reports platelets normally in 80's range.  6. GERD: Protonix is ineffective and causing worsening of symptoms. Will resume home Prilosec.  7. 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 Constipation: MOM prn      Length of stay, days: 3  Sonda Primes , MD 08/31/2012, 10:49 AM

## 2012-08-31 NOTE — Progress Notes (Signed)
Physical Therapy Session Note  Patient Details  Name: Brandon Hardy MRN: [id number] Date of Birth: 04-Sep-1927  Today's Date: 08/31/2012 Time: 1030-1130 Time Calculation (min): 60 min  Short Term Goals: Week 1:  PT Short Term Goal 1 (Week 1): Patient will perform bed mobliity with mod I. PT Short Term Goal 2 (Week 1): Patient will perform stand pivot transfers with RW and supervision. PT Short Term Goal 3 (Week 1): Patient will perform gait training x150' in controlled environment with RW and supervision. PT Short Term Goal 4 (Week 1): Patient will negotiate 4 stairs with B handrails and supervision. PT Short Term Goal 5 (Week 1): Patiet will negotiate 2 steps without handrails and with RW and supervision.  Therapy Documentation Precautions:  Precautions Precautions: Fall Precaution Comments: L inattention Restrictions Weight Bearing Restrictions: No Pain: denies pain currently  Therapeutic Activity:(15') Transfers sit<->stand into RW and car transfers are SBA multiple times. Transfer training in/out of training car with S/Mod-I . Therapeutic Exercise:(15')  Manually resisted B LE's in sitting. Nu-Step x 5 minutes at Level 4 Gait Training:(30') using RW 2 x 200' with S/Mod-I and verbal cues for Left hand grip repositioning. Up/Down 4 steps using B Handrails.   Therapy/Group: Individual Therapy  Tamiya Colello J 08/31/2012, 11:05 AM

## 2012-08-31 NOTE — Progress Notes (Signed)
Occupational Therapy Session Note  Patient Details  Name: Brandon Hardy MRN: [id number] Date of Birth: 1927-06-14  Today's Date: 08/31/2012 Time: 1478-2956 Time Calculation (min): 39 min  Short Term Goals: Week 1:  OT Short Term Goal 1 (Week 1): Refer to LTGs d/t expected short LOS  Skilled Therapeutic Interventions/Progress Updates: ADL-retraining with emphasis on seated dressing, error recognition and problem-solving, motor-planning, dynamic sitting and standing balance, and endurance.   Patient reported having taken a bath the day before and he was not agreeable to bathing again but was more interested in improving his balance.  Patient denied deficits with bathing or dressing but when challenged to dress himself he was unable to place his feet through pant openings or don his shoes without significant assistance due to confusion (dressing apraxia).    Patient required 30 minutes to dress himself and additional time to ambulate outside of his room with RW and close supervision.   Patient demo'd LOB X2 while standing to pull up underwear and pants.    Therapy Documentation Precautions:  Precautions Precautions: Fall Precaution Comments: L inattention Restrictions Weight Bearing Restrictions: No  Pain: Pain Assessment Pain Score: 5  Pain Type: Acute pain Pain Location: Back Pain Intervention(s): Medication (See eMAR)  See FIM for current functional status  Therapy/Group: Individual Therapy  Second session: Time: 1300-1335 Time Calculation (min):35 min  Pain Assessment: No report pain  Skilled Therapeutic Interventions: Therapeutic activity with emphasis on endurance, functional mobility using RW, and dynamic standing balance during functional activity.  Patient ambulated to gym with RW and close supervision, completed 10 min of general conditioning using bil UE/LE on NuStep, level 2 & 3, ambulated back to his room (approx 150') and performed care of dentures while standing at  sink, for approx 5 minutes.   Patient requires redirection to attend to task during treatment due to distraction from conversations and socialization.  See FIM for current functional status  Therapy/Group: Individual Therapy  Georgeanne Nim 08/31/2012, 12:26 PM

## 2012-08-31 NOTE — Progress Notes (Signed)
Physical Therapy Session Note  Patient Details  Name: Brandon Hardy MRN: [id number] Date of Birth: [birth year]  Today's Date: 08/31/2012 Time: 1500-1600 Time Calculation (min): 60 min  Short Term Goals: Week 1:  PT Short Term Goal 1 (Week 1): Patient will perform bed mobliity with mod I. PT Short Term Goal 2 (Week 1): Patient will perform stand pivot transfers with RW and supervision. PT Short Term Goal 3 (Week 1): Patient will perform gait training x150' in controlled environment with RW and supervision. PT Short Term Goal 4 (Week 1): Patient will negotiate 4 stairs with B handrails and supervision. PT Short Term Goal 5 (Week 1): Patiet will negotiate 2 steps without handrails and with RW and supervision.  Therapy Documentation Precautions:  Precautions Precautions: Fall Precaution Comments: L inattention Restrictions Weight Bearing Restrictions: No  Therapeutic Activity:(15)Transfer training sit<->stand into RW multiple times. Therapeutic Exercise:(15') Nu-Step, Level 3, x 10 minutes with rest breaks  Gait Training:(30') Using RW 3 x 200' with patient remembering to attend to L hand grip on handle. Ambulation in hallway without RW with patient using handrail on right side with SBA. Patient had short spaces where the handrail was unavailable (ie; across doorways) where he navigated cautiously without assistive device/handrail. Patient has decreased speed, widened BOS and decreased stride length when not using assistive device and mildly improved with right handrail.   Therapy/Group: Individual Therapy  Kelia Gibbon J 08/31/2012, 3:03 PM

## 2012-09-01 ENCOUNTER — Inpatient Hospital Stay (HOSPITAL_COMMUNITY): Payer: Medicare Other

## 2012-09-01 DIAGNOSIS — E876 Hypokalemia: Secondary | ICD-10-CM

## 2012-09-01 DIAGNOSIS — M6281 Muscle weakness (generalized): Secondary | ICD-10-CM

## 2012-09-01 DIAGNOSIS — M549 Dorsalgia, unspecified: Secondary | ICD-10-CM

## 2012-09-01 DIAGNOSIS — G8929 Other chronic pain: Secondary | ICD-10-CM

## 2012-09-01 NOTE — Progress Notes (Signed)
Brandon Hardy is a 79 y.o. male June 14, 1927 [id number]  Subjective: No new complaints. No new problems. Slept well. Feeling good. Stool: q 3 days  Objective: Vital signs in last 24 hours: Temp:  [98.2 F (36.8 C)-98.3 F (36.8 C)] 98.3 F (36.8 C) (08/31 0645) Pulse Rate:  [55-60] 60 (08/31 0645) Resp:  [16-18] 16 (08/31 0645) BP: (119-122)/(49-70) 122/49 mmHg (08/31 0645) SpO2:  [97 %] 97 % (08/31 0645) Weight change:  Last BM Date: 08/29/12  Intake/Output from previous day: 08/30 0701 - 08/31 0700 In: 360 [P.O.:360] Out: 925 [Urine:925] Last cbgs: CBG (last 3)  No results found for this basename: GLUCAP,  in the last 72 hours   Physical Exam General: No apparent distress    HEENT: moist mucosa Lungs: Normal effort. Lungs clear to auscultation, no crackles or wheezes. Cardiovascular: Regular rate and rhythm, no edema Musculoskeletal:  No change from before Neurological: No new neurological deficits Wounds: N/A    Skin: clear Alert, cooperative   Lab Results: BMET    Component Value Date/Time   NA 140 08/29/2012 0600   K 4.0 08/29/2012 0600   CL 108 08/29/2012 0600   CO2 26 08/29/2012 0600   GLUCOSE 90 08/29/2012 0600   BUN 22 08/29/2012 0600   CREATININE 1.05 08/29/2012 0600   CALCIUM 8.5 08/29/2012 0600   GFRNONAA 64* 08/29/2012 0600   GFRAA 74* 08/29/2012 0600   CBC    Component Value Date/Time   WBC 5.0 08/29/2012 0600   RBC 3.20* 08/29/2012 0600   HGB 10.7* 08/29/2012 0600   HCT 32.3* 08/29/2012 0600   PLT 106* 08/29/2012 0600   MCV 100.9* 08/29/2012 0600   MCH 33.4 08/29/2012 0600   MCHC 33.1 08/29/2012 0600   RDW 13.6 08/29/2012 0600   LYMPHSABS 1.7 08/29/2012 0600   MONOABS 0.5 08/29/2012 0600   EOSABS 0.3 08/29/2012 0600   BASOSABS 0.0 08/29/2012 0600    Studies/Results: No results found.  Medications: I have reviewed the patient's current medications.  Assessment/Plan:  1. DVT Prophylaxis/Anticoagulation: Mechanical: Sequential compression devices,  below knee Bilateral lower extremities  2. Chronic back pain/ Pain Management: Cervical and lumbar post lami syndrome Was started on prednisone in the past month by Dr. Noel Gerold and this has helped some. Continue chronic steroids and neurontin.  3. Mood: Will have LCSW follow for evaluation.  4. Neuropsych: This patient is capable of making decisions on his own behalf.  5. Thrombocytopenia: Will monitor for signs of bleeding/routine check of CBC. Reports platelets normally in 80's range.  6. GERD: Protonix is ineffective and causing worsening of symptoms. Will resume home Prilosec.  7. 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 Constipation: MOM prn  Cont Rx     Length of stay, days: 4  Sonda Primes , MD 09/01/2012, 9:33 AM

## 2012-09-01 NOTE — Progress Notes (Signed)
Occupational Therapy Session Note  Patient Details  Name: Brandon Hardy MRN: [id number] Date of Birth: Dec 10, 1927  Today's Date: 09/01/2012 Time: 4098-1191 Time Calculation (min): 40 min  Short Term Goals: Week 1:  OT Short Term Goal 1 (Week 1): Refer to LTGs d/t expected short LOS  Skilled Therapeutic Interventions/Progress Updates: Therapeutic activities with emphasis on static standing balance exercises using printed [employer] home exercise program (as per [employer]).   Patient completed static standing using bil UE supported on countertop, completing weigh-shifts form right to left as directed and alternating one-leg standing with supervision for safety.   Patient performed exercises 1 and 2, beginner on solid surface but was unable to progress beyond using left hand as additional stabilizer during this session.  OT educated patient on alternate methods to reduce risk for falls at home but advised patient to always have stand-by assistance for safety.     Therapy Documentation Precautions:  Precautions Precautions: Fall Precaution Comments: L inattention Restrictions Weight Bearing Restrictions: No  Pain: Pain Assessment Pain Assessment: No/denies pain  See FIM for current functional status  Therapy/Group: Individual Therapy  Georgeanne Nim 09/01/2012, 3:38 PM

## 2012-09-02 ENCOUNTER — Inpatient Hospital Stay (HOSPITAL_COMMUNITY): Payer: Medicare Other

## 2012-09-02 ENCOUNTER — Inpatient Hospital Stay (HOSPITAL_COMMUNITY): Payer: Medicare Other | Admitting: *Deleted

## 2012-09-02 NOTE — Plan of Care (Signed)
Problem: RH BLADDER ELIMINATION Goal: RH STG MANAGE BLADDER WITH ASSISTANCE STG Manage Bladder With Modified independence Assistance  Outcome: Progressing Daytime up to bathroom or uses urinal, night time using condom cath staff manages, will start to try and toilet during night verses use of condom cath

## 2012-09-02 NOTE — Progress Notes (Signed)
Occupational Therapy Session Note  Patient Details  Name: Brandon Hardy MRN: [id number] Date of Birth: 08-26-1927  Today's Date: 09/02/2012 Time: 0930-1030 and 1130-1200 Time Calculation (min): 60 min and 30 min   Short Term Goals: Week 1:  OT Short Term Goal 1 (Week 1): Refer to LTGs d/t expected short LOS  Skilled Therapeutic Interventions/Progress Updates:    Session 1: Therapy session focused on ADL retraining, functional transfers, dynamic standing balance, and safety awareness. Pt completed dressing on this date with increased time however pt continuously reporting he took an extended amount of time at home. Pt required assist to thread belt through loops as he was completing in standing with SBA for balance. Educated on threading belt prior to donning pants and pt agreed. Discussed safety extensively with pt and increasing awareness of deficits. Pt reported he pushes rolling cart with laundry at home then wife loads and unloads washing machine. Practiced pushing w/c to simulate cart without ambulatory device with steadying-SBA for safety.   Session 2: Therapy session focused on safety with home management tasks and dynamic standing balance. Pt ambulated to ADL apartment with supervision using RW. Engaged in loading and unloading dishwasher with supervision and min verbal cues for safety tech. Pt with good dynamic standing balance and safety when reaching out of BOS into overhead cabinets and low cabinets. Pt reported he keeps all items in easy to reach places at home. Pt ambulated back to room and left sitting in recliner chair with lunch.   Therapy Documentation Precautions:  Precautions Precautions: Fall Precaution Comments: L inattention Restrictions Weight Bearing Restrictions: No General:   Vital Signs: Therapy Vitals Temp: 98.4 F (36.9 C) Temp src: Oral Pulse Rate: 53 Resp: 16 BP: 108/52 mmHg Patient Position, if appropriate: Lying Oxygen Therapy SpO2: 98 % O2  Device: None (Room air) Pain: No c/o pain during therapy sessions.   See FIM for current functional status  Therapy/Group: Individual Therapy  Americus Perkey, Vara Guardian 09/02/2012, 10:30 AM

## 2012-09-02 NOTE — Progress Notes (Signed)
Physical Therapy Session Note  Patient Details  Name: Brandon Hardy MRN: [id number] Date of Birth: 12/16/1927  Today's Date: 09/02/2012 Time: 4098-1191; 4782-9562 Time Calculation (min): 45 min and 47 min  Short Term Goals: Week 1:  PT Short Term Goal 1 (Week 1): Patient will perform bed mobliity with mod I. PT Short Term Goal 2 (Week 1): Patient will perform stand pivot transfers with RW and supervision. PT Short Term Goal 3 (Week 1): Patient will perform gait training x150' in controlled environment with RW and supervision. PT Short Term Goal 4 (Week 1): Patient will negotiate 4 stairs with B handrails and supervision. PT Short Term Goal 5 (Week 1): Patiet will negotiate 2 steps without handrails and with RW and supervision.  Skilled Therapeutic Interventions/Progress Updates:    AM Session: Patient received sitting in recliner. Session focused on gait training in various environments, furniture transfers, floor transfer, stairs, and curb/ramp negotiation; see details below.   Education about falls risk at home and indications vs. Contraindications for attempting floor transfer vs. Calling EMS. Patient reports he always has cell phone in his pocket and calls his wife or neighbor if he has fallen. Patient able to recite back indications vs. Contraindications for attempting floor transfer. Patient performed floor transfer with visual demonstration prior. Patient transfers standing>standing with B UE on mat>half kneeling with B UE on mat>tall kneeling with B UE on mat>quadruped>R side sitting>R sidelying>supine>R sidelying> R side sitting>quadruped>tall kneeling with B UE on mat>half kneeling with B UE on mat>standing with B UE on mat>sitting on mat. Emphasis on always using stable piece of furniture for UE support and not a furniture that will move.  Patient returned to room and left seated in recliner with all needs within reach.  PM Session: Patient received sitting in wheelchair. Session  focused on ambulation on uneven surfaces and in community environment as well as static and dynamic standing balance. Patient instructed in gait training >300' x3 with RW and supervision. Patient performed gait training off unit, negotiating on/off elevator, in community environment of hospital lobby, negotiating people and obstacles, changes in surfaces. Patient ambulated outside on brick and concrete surfaces, inclines/declines, etc. Without any overt LOB. Patient continues to require verbal cues for B foot clearance secondary to shuffling.  Patient performed gait training 100' x2 with R handheld assist and minA. Patient demonstrates significantly decreased step/stride length, increased lateral trunk sway, and patient is more anxious and reaching for objects to steady himself. Patient performed static standing balance, see details below. Patient returned to room and left seated in recliner with all needs within reach.   Therapy Documentation Precautions:  Precautions Precautions: Fall Precaution Comments: L inattention Restrictions Weight Bearing Restrictions: No General: Amount of Missed PT Time (min): 13 Minutes Missed Time Reason: Other (comment) (PT in meeting) Pain: Pain Assessment Pain Assessment: No/denies pain Pain Score: 0-No pain Locomotion : Ambulation Ambulation: Yes Ambulation/Gait Assistance: 5: Supervision Ambulation Distance (Feet): 200 Feet Assistive device: Rolling walker Ambulation/Gait Assistance Details: Verbal cues for gait pattern Ambulation/Gait Assistance Details: Patient instructed in gait training 200' x2 and 160' x2 with RW and S in controlled and home environments (on carpet and in confined spaces). Verbal cues to maintain longer step/stride lengths and for B foot clearance. Patient insists he "always had trouble picking up (his) feet". Gait Gait: Yes Gait Pattern: Impaired Gait Pattern: Decreased stride length;Step-through pattern;Narrow base of  support Stairs / Additional Locomotion Stairs: Yes Stairs Assistance: 5: Supervision Stairs Assistance Details: Verbal cues for precautions/safety;Verbal  cues for technique Stair Management Technique: Step to pattern;Forwards;One rail Left Number of Stairs: 5 Height of Stairs: 6 Ramp: 5: Supervision (with RW) Curb: 5: Supervision (with RW)  Balance: Balance Balance Assessed: Yes Static Standing Balance Static Standing - Balance Support: No upper extremity supported Static Standing - Level of Assistance: 3: Mod assist;4: Min assist Static Standing - Comment/# of Minutes: Feet together x30" with min-modA; Tandem x30" leading with each LE with min-modA. Other Treatments: Treatments Therapeutic Activity: Patient performed x2 sit<>stand transfers with RW from low, cushioned sofa (without arm rests) with supervision.  See FIM for current functional status  Therapy/Group: Individual Therapy  Chipper Herb. Cynda Soule, PT, DPT 09/02/2012, 2:45 PM

## 2012-09-02 NOTE — Progress Notes (Signed)
Patient ID: Brandon Hardy, male   DOB: 05/16/1927, 79 y.o.   MRN: [id number] Subjective/Complaints: 79 y.o. right-handed male with history of OA, chronic back pain, gait disorder; who was admitted 08/23/2012 with acute left-sided weakness while mowing his lawn. MRI of the brain showed acute right MCA territory infarction. Hypokalemia 3.3 with supplement added. MRA of the head with no proximal stenosis or aneurysm. Echocardiogram with ejection fraction of 55% without emboli. Carotid Dopplers with no ICA stenosis. TEE pending. Patient did not receive TPA. Patient has a history of GIB due to ulcer and esophageal bleed (secondary to ETOH) along with thrombocytopenia-- platelet 89,000 on admission. UDS positive for opiates. Neurology services consulted and recommended low dose aspirin therapy for CVA prophylaxis. TEE with tiny PFO and BLE dopplers without evidence of DVT  No new issues States EtOH abuse was remote "can I drive?"  Review of Systems  Neurological: Positive for sensory change.  All other systems reviewed and are negative.     Objective: Vital Signs: Blood pressure 108/52, pulse 53, temperature 98.4 F (36.9 C), temperature source Oral, resp. rate 16, height 5\' 7"  (1.702 m), weight 62.914 kg (138 lb 11.2 oz), SpO2 98.00%. No results found. No results found for this or any previous visit (from the past 72 hour(s)).    Vitals reviewed.  Constitutional: He is oriented to person, place, and time.  HENT:  Head: Normocephalic.  Eyes: EOM are normal.  Neck: Normal range of motion. Neck supple. No thyromegaly present.  Cardiovascular: Regular rhythm.  Pulmonary/Chest: Effort normal and breath sounds normal. No respiratory distress.  Abdominal: Soft. Bowel sounds are normal. He exhibits no distension.  Neurological: He is alert and oriented to person, place, and time.  Follows commands. Very alert. Mild left central 7. Mild left pronator drift. Strength nearly symmetrical in all 4's  at 4/5. No gross sensory deficits. Mild dysmetria on Left side Skin: Skin is warm and dry.  Psychiatric: He has a normal mood and affect.    Assessment/Plan: 1. Functional deficits secondary to Right MCA infarct which require 3+ hours per day of interdisciplinary therapy in a comprehensive inpatient rehab setting. Physiatrist is providing close team supervision and 24 hour management of active medical problems listed below. Physiatrist and rehab team continue to assess barriers to discharge/monitor patient progress toward functional and medical goals. FIM: FIM - Bathing Bathing Steps Patient Completed: Right Arm;Chest;Front perineal area;Buttocks;Right upper leg;Left Arm;Left upper leg;Abdomen Bathing: 4: Min-Patient completes 8-9 75f 10 parts or 75+ percent  FIM - Upper Body Dressing/Undressing Upper body dressing/undressing steps patient completed: Thread/unthread right sleeve of pullover shirt/dresss;Thread/unthread left sleeve of pullover shirt/dress;Put head through opening of pull over shirt/dress;Pull shirt over trunk Upper body dressing/undressing: 4: Min-Patient completed 75 plus % of tasks FIM - Lower Body Dressing/Undressing Lower body dressing/undressing steps patient completed: Pull underwear up/down;Pull pants up/down Lower body dressing/undressing: 4: Min-Patient completed 75 plus % of tasks  FIM - Toileting Toileting steps completed by patient: Adjust clothing prior to toileting;Performs perineal hygiene;Adjust clothing after toileting Toileting Assistive Devices: Grab bar or rail for support Toileting: 4: Steadying assist  FIM - Diplomatic Services operational officer Devices: Best boy Transfers: 4-To toilet/BSC: Min A (steadying Pt. > 75%);4-From toilet/BSC: Min A (steadying Pt. > 75%)  FIM - Banker Devices: Walker;Arm rests Bed/Chair Transfer: 5: Chair or W/C > Bed: Supervision (verbal cues/safety issues);5:  Bed > Chair or W/C: Supervision (verbal cues/safety issues)  FIM - Locomotion: Wheelchair Locomotion: Wheelchair:  0: Activity did not occur FIM - Locomotion: Ambulation Locomotion: Ambulation Assistive Devices: Walker - Rolling Ambulation/Gait Assistance: 5: Supervision Locomotion: Ambulation: 5: Travels 150 ft or more with supervision/safety issues  Comprehension Comprehension Mode: Auditory Comprehension: 4-Understands basic 75 - 89% of the time/requires cueing 10 - 24% of the time  Expression Expression Mode: Verbal Expression: 5-Expresses basic needs/ideas: With extra time/assistive device  Social Interaction Social Interaction: 4-Interacts appropriately 75 - 89% of the time - Needs redirection for appropriate language or to initiate interaction.  Problem Solving Problem Solving: 4-Solves basic 75 - 89% of the time/requires cueing 10 - 24% of the time  Memory Memory: 5-Recognizes or recalls 90% of the time/requires cueing < 10% of the time  Medical Problem List and Plan:  1. DVT Prophylaxis/Anticoagulation: Mechanical: Sequential compression devices, below knee Bilateral lower extremities  2. Chronic back pain/ Pain Management: Cervical and lumbar post lami syndrome Was started on prednisone in the past month by Dr. Noel Gerold and this has helped some. Continue chronic steroids and neurontin.  3. Mood: Will have LCSW follow for evaluation.  4. Neuropsych: This patient is capable of making decisions on his own behalf.  5. Thrombocytopenia: Will monitor for signs of bleeding/routine check of CBC. Reports platelets normally in 80's range.  6. GERD: Protonix is ineffective and causing worsening of symptoms. Will resume home Prilosec.  7.  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

## 2012-09-03 ENCOUNTER — Inpatient Hospital Stay (HOSPITAL_COMMUNITY): Payer: Medicare Other

## 2012-09-03 ENCOUNTER — Inpatient Hospital Stay (HOSPITAL_COMMUNITY): Payer: Medicare Other | Admitting: *Deleted

## 2012-09-03 MED ORDER — SENNOSIDES-DOCUSATE SODIUM 8.6-50 MG PO TABS
1.0000 | ORAL_TABLET | Freq: Two times a day (BID) | ORAL | Status: DC
  Administered 2012-09-03 – 2012-09-05 (×4): 1 via ORAL
  Filled 2012-09-03 (×6): qty 1

## 2012-09-03 NOTE — Progress Notes (Signed)
Physical Therapy Session Note  Patient Details  Name: Brandon Hardy MRN: [id number] Date of Birth: February 15, 1927  Today's Date: 09/03/2012 Time: 7829-5621 Time Calculation (min): 30 min  Short Term Goals: Week 2:  PT Short Term Goal 1 (Week 2): STGs=LTGS due to shortened LOS  Skilled Therapeutic Interventions/Progress Updates:   Treatment focused on gait on level tile and carpet, transporting items while ambulating with RW, neuromuscular re-education via demo, VCS, tactile cues for activities below.  Gait x 150' x 2 with supervision including turns.  During external perturbations in standing without UE support, pt demonstrated adequate ankle strategy for slow posterior perturbations, but absent hip strategy for quick posterior perturbations.  Despite cues to leave bil hands loose beside him during this, he chose to extend bil shoulders and hold fisted hands behind his body, apparently as a protective response.  During dry mopping of kitchen floor , pt attempted to use L hand on center-front of RW while it was turned to the side, and mop with R hand, but due to force of L hand, he tipped walker forward and needed mod assistance to prevent fall.  Therapist suggested that pt use L hand on counter, which was successful, but pt did not attend to L hand as he encountered obstacles on the counter top. At end of counter, pt required mod assist without RW to take one step to turn L and mop other side of floor.  Pt is unconcerned and unaware of the safety risks during simple housekeeping tasks.  Pt performed bil hand task placing clothing items on hangers, in sitting; demonstrating L inattention.  gait in ADL apt on carpet, transporting 3 clothing items on hangers over RW.  He hung hangers up with supervision, without LOB.    Therapy Documentation Precautions:  Precautions Precautions: Fall Precaution Comments: L inattention Restrictions Weight Bearing Restrictions: No Pain: Pain Assessment Pain  Assessment: No/denies pain Pain Score: 0-No pain   Locomotion : Ambulation Ambulation: Yes Ambulation/Gait Assistance: 5: Supervision Trunk/Postural Assessment :    Balance:   Exercises:   Other Treatments: Treatments Therapeutic Activity: dry mopping kitchen floor, placing clothes on hangers,  transporting clothing on RW to hang up in standing, dynamic standing balance recovery from external perturbations Neuromuscular Facilitation: Forced use;Activity to increase timing and sequencing;Activity to increase grading;Left;Upper Extremity;Lower Extremity;Activity to increase sustained activation;Activity to increase anterior-posterior weight shifting  See FIM for current functional status  Therapy/Group: Individual Therapy  Brayah Urquilla 09/03/2012, 4:02 PM

## 2012-09-03 NOTE — Progress Notes (Signed)
Occupational Therapy Session Note  Patient Details  Name: Brandon Hardy MRN: [id number] Date of Birth: June 08, 1927  Today's Date: 09/03/2012 Time: 4098-1191 and 4782-9562 Time Calculation (min): 75 min and 40 min   Short Term Goals: Week 1:  OT Short Term Goal 1 (Week 1): Refer to LTGs d/t expected short LOS  Skilled Therapeutic Interventions/Progress Updates:    Session 1: Therapy session focused on functional transfers, dynamic standing balance, and ADL retraining. Educated pt on placing clothing over front of RW before transporting and pt with carryover of tech then ambulated with RW with supervision to ADL shower. Completed tub transfer with supervision and increased time however pt able to problem solve to safely complete with cues. Engaged in shower while standing 80% of task with increased time for shower. Pt use grab bars throughout shower and no lob reported. Pt required increased time for dressing on this date and demonstrated carryover with donning belt in pants prior to donning pants. Pt ambulated back to room at end of shower and placed dirty clothes in laundry. Pt left sitting in recliner chair with all needs in reach.   Session 2: Therapy session focused on safety awareness, dynamic standing balance, community re-integration, and activity tolerance. Pt received sitting in recliner chair finishing lunch. Pt ambulated with RW to elevator and down to main lobby with supervision and I verbal cue as he became slightly off balance when stepping over small threshold he did not see. Pt made excuse for threshold stating "that wasn't there before." Provided education on always being aware of environment and how to adjust. Ambulated outside and down slope that pt reported being similar to the one at his grocery store. Good carryover of recall of safety tech when ambulating up and down slopes. Pt completed curb step with supervision. Discussed home environment and pt reported he will not be doing  any more gardening this year and was unsure about the following year. Pt reported him and his wife go to a cafeteria style restaurant every Wednesday. Discussed safety with retrieving food and transporting it when using RW. Pt reports he is able to slide tray down counter until the cashier then his wife asks an employee to assist with carrying their trays. Informed him this was a great technique then educated on getting a to-go bag for the items if they were unable to receive help. Pt with good awareness of people surrounding him during therapy session. Pt left with all items in reach.   Therapy Documentation Precautions:  Precautions Precautions: Fall Precaution Comments: L inattention Restrictions Weight Bearing Restrictions: No General:   Vital Signs:   Pain: Pt with no c/o pain during therapy sessions.   See FIM for current functional status  Therapy/Group: Individual Therapy  Daneil Dan 09/03/2012, 11:20 AM

## 2012-09-03 NOTE — Progress Notes (Signed)
Social Work Patient ID: Brandon Hardy, male   DOB: 10/01/1927, 79 y.o.   MRN: [id number] Met with team and pt and spoke with son via telephone to inform team feels ready for discharge Thurs.  Son very pleased with plan. Pt feels he will be ready and is getting better each day.  He is aware of not being able to drive when he leaves spoke with MD regarding this. Will prepare pt for discharge Thurs.

## 2012-09-03 NOTE — Progress Notes (Signed)
Physical Therapy Session Note  Patient Details  Name: Brandon Hardy MRN: [id number] Date of Birth: Jul 19, 1927  Today's Date: 09/03/2012 Time: 4098-1191 Time Calculation (min): 45 min  Short Term Goals: Week 1:  PT Short Term Goal 1 (Week 1): Patient will perform bed mobliity with mod I. PT Short Term Goal 1 - Progress (Week 1): Met PT Short Term Goal 2 (Week 1): Patient will perform stand pivot transfers with RW and supervision. PT Short Term Goal 2 - Progress (Week 1): Met PT Short Term Goal 3 (Week 1): Patient will perform gait training x150' in controlled environment with RW and supervision. PT Short Term Goal 3 - Progress (Week 1): Met PT Short Term Goal 4 (Week 1): Patient will negotiate 4 stairs with B handrails and supervision. PT Short Term Goal 4 - Progress (Week 1): Met PT Short Term Goal 5 (Week 1): Patiet will negotiate 2 steps without handrails and with RW and supervision. PT Short Term Goal 5 - Progress (Week 1): Met  PT Short Term Goal : STGs=LTGS due to shortened LOS  Skilled Therapeutic Interventions/Progress Updates:    Patient received sitting in recliner. Session focused on gait training, stair negotiation, car transfer, and floor transfer; see details below. Patient performed car transfer with RW and supervision (for safety cues). Patient able to open door to car while managing RW, but requires supervision/set up for management of RW (folding up to put in car and take out of car). Patient completed floor transfer with supervision and able to recall 3 contraindications for attempting floor transfer vs. Calling EMS. Patient transfers standing>standing with B UE on mat>half kneeling with B UE on mat>tall kneeling with B UE on mat>quadruped>R side sitting>R sidelying>supine>R sidelying> R side sitting>quadruped>tall kneeling with B UE on mat>half kneeling with B UE on mat>standing with B UE on mat>sitting on mat. Patient able to complete without demonstration prior and recalls  proper sequencing and technique without increased cues.  Patient performed dynamic standing balance activity: vacuuming with emphasis on use of RW during vacuuming, proper positioning and use of RW to assist with maintaining balance. Emphasis on pace and management of one thing at a time (i.e. Don't try to move the RW and vacuum at the same time). Patient performed activity for approximately 4-5 min with supervision, but mod cues for proper/safe management of RW.  Therapy Documentation Precautions:  Precautions Precautions: Fall Precaution Comments: L inattention Restrictions Weight Bearing Restrictions: No Pain: Pain Assessment Pain Assessment: No/denies pain Pain Score: 0-No pain Locomotion : Ambulation Ambulation: Yes Ambulation/Gait Assistance: 5: Supervision Ambulation Distance (Feet): 200 Feet Assistive device: Rolling walker Ambulation/Gait Assistance Details: Verbal cues for gait pattern Ambulation/Gait Assistance Details: Patient instructed in gait training 200' x2 with RW and S in controlled and home environments (on carpet and in confined spaces). Verbal cues for attention to L hand placement on RW when patient is distracted. Verbal cues for longer step length on R and patient continues to insist he "always walked like this." Gait Gait: Yes Gait Pattern: Impaired Gait Pattern: Step-through pattern;Narrow base of support;Decreased step length - right High Level Ambulation High Level Ambulation: Backwards walking Backwards Walking: x50' with RW and supervision, no overt LOB Stairs / Additional Locomotion Curb: 5: Supervision (x4 with RW to simulate STE home through garage)   See FIM for current functional status  Therapy/Group: Individual Therapy  Chipper Herb. Milisa Kimbell, PT, DPT 09/03/2012, 12:25 PM

## 2012-09-03 NOTE — Progress Notes (Signed)
Patient ID: Brandon Hardy, male   DOB: 27-Dec-1927, 79 y.o.   MRN: [id number] Subjective/Complaints: 79 y.o. right-handed male with history of OA, chronic back pain, gait disorder; who was admitted 08/23/2012 with acute left-sided weakness while mowing his lawn. MRI of the brain showed acute right MCA territory infarction. Hypokalemia 3.3 with supplement added. MRA of the head with no proximal stenosis or aneurysm. Echocardiogram with ejection fraction of 55% without emboli. Carotid Dopplers with no ICA stenosis. TEE pending. Patient did not receive TPA. Patient has a history of GIB due to ulcer and esophageal bleed (secondary to ETOH) along with thrombocytopenia-- platelet 89,000 on admission. UDS positive for opiates. Neurology services consulted and recommended low dose aspirin therapy for CVA prophylaxis. TEE with tiny PFO and BLE dopplers without evidence of DVT  Had large BM after several days  Review of Systems  Neurological: Positive for sensory change.  All other systems reviewed and are negative.     Objective: Vital Signs: Blood pressure 111/50, pulse 56, temperature 98.7 F (37.1 C), temperature source Oral, resp. rate 17, height 5\' 7"  (1.702 m), weight 62.914 kg (138 lb 11.2 oz), SpO2 99.00%. No results found. Results for orders placed during the hospital encounter of 08/28/12 (from the past 72 hour(s))  OCCULT BLOOD X 1 CARD TO LAB, STOOL     Status: None   Collection Time    09/02/12  7:18 PM      Result Value Range   Fecal Occult Bld NEGATIVE  NEGATIVE  OCCULT BLOOD X 1 CARD TO LAB, STOOL     Status: None   Collection Time    09/02/12  8:30 PM      Result Value Range   Fecal Occult Bld NEGATIVE  NEGATIVE      Vitals reviewed.  Constitutional: He is oriented to person, place, and time.  HENT:  Head: Normocephalic.  Eyes: EOM are normal.  Neck: Normal range of motion. Neck supple. No thyromegaly present.  Cardiovascular: Regular rhythm.  Pulmonary/Chest: Effort  normal and breath sounds normal. No respiratory distress.  Abdominal: Soft. Bowel sounds are normal. He exhibits no distension.  Neurological: He is alert and oriented to person, place, and time.  Follows commands. Very alert. Mild left central 7. Mild left pronator drift. Strength nearly symmetrical in all 4's at 4/5. No gross sensory deficits. Mild dysmetria on Left side Skin: Skin is warm and dry.  Psychiatric: He has a normal mood and affect.    Assessment/Plan: 1. Functional deficits secondary to Right MCA infarct which require 3+ hours per day of interdisciplinary therapy in a comprehensive inpatient rehab setting. Physiatrist is providing close team supervision and 24 hour management of active medical problems listed below. Physiatrist and rehab team continue to assess barriers to discharge/monitor patient progress toward functional and medical goals. FIM: FIM - Bathing Bathing Steps Patient Completed: Right Arm;Chest;Front perineal area;Buttocks;Right upper leg;Left Arm;Left upper leg;Abdomen Bathing: 4: Min-Patient completes 8-9 67f 10 parts or 75+ percent  FIM - Upper Body Dressing/Undressing Upper body dressing/undressing steps patient completed: Thread/unthread right sleeve of pullover shirt/dresss;Thread/unthread left sleeve of pullover shirt/dress;Put head through opening of pull over shirt/dress;Pull shirt over trunk Upper body dressing/undressing: 5: Set-up assist to: Obtain clothing/put away FIM - Lower Body Dressing/Undressing Lower body dressing/undressing steps patient completed: Pull pants up/down;Thread/unthread right pants leg;Thread/unthread left pants leg;Don/Doff right shoe;Fasten/unfasten right shoe;Fasten/unfasten left shoe;Don/Doff left shoe Lower body dressing/undressing: 4: Min-Patient completed 75 plus % of tasks  FIM - Toileting Toileting steps completed  by patient: Adjust clothing prior to toileting;Performs perineal hygiene;Adjust clothing after  toileting Toileting Assistive Devices: Grab bar or rail for support Toileting: 4: Steadying assist  FIM - Diplomatic Services operational officer Devices: Walker;Grab bars Toilet Transfers: 4-From toilet/BSC: Min A (steadying Pt. > 75%)  FIM - Banker Devices: Walker;Arm rests Bed/Chair Transfer: 5: Bed > Chair or W/C: Supervision (verbal cues/safety issues)  FIM - Locomotion: Wheelchair Locomotion: Wheelchair: 0: Activity did not occur FIM - Locomotion: Ambulation Locomotion: Ambulation Assistive Devices: Designer, industrial/product Ambulation/Gait Assistance: 5: Supervision Locomotion: Ambulation: 5: Travels 150 ft or more with supervision/safety issues  Comprehension Comprehension Mode: Auditory Comprehension: 4-Understands basic 75 - 89% of the time/requires cueing 10 - 24% of the time  Expression Expression Mode: Verbal Expression: 5-Expresses basic needs/ideas: With extra time/assistive device  Social Interaction Social Interaction: 5-Interacts appropriately 90% of the time - Needs monitoring or encouragement for participation or interaction.  Problem Solving Problem Solving: 4-Solves basic 75 - 89% of the time/requires cueing 10 - 24% of the time  Memory Memory: 5-Recognizes or recalls 90% of the time/requires cueing < 10% of the time  Medical Problem List and Plan:  1. DVT Prophylaxis/Anticoagulation: Mechanical: Sequential compression devices, below knee Bilateral lower extremities  2. Chronic back pain/ Pain Management: Cervical and lumbar post lami syndrome Was started on prednisone in the past month by Dr. Noel Gerold and this has helped some. Continue chronic steroids and neurontin.  3. Mood: Will have LCSW follow for evaluation.  4. Neuropsych: This patient is capable of making decisions on his own behalf.  5. Thrombocytopenia: Will monitor for signs of bleeding/routine check of CBC. Reports platelets normally in 80's range.  6. GERD:  Protonix is ineffective and causing worsening of symptoms. Will resume home Prilosec.  7.  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

## 2012-09-04 ENCOUNTER — Inpatient Hospital Stay (HOSPITAL_COMMUNITY): Payer: Medicare Other

## 2012-09-04 ENCOUNTER — Inpatient Hospital Stay (HOSPITAL_COMMUNITY): Payer: Medicare Other | Admitting: *Deleted

## 2012-09-04 LAB — CBC
HCT: 31.6 % — ABNORMAL LOW (ref 39.0–52.0)
MCH: 34.4 pg — ABNORMAL HIGH (ref 26.0–34.0)
MCHC: 34.5 g/dL (ref 30.0–36.0)
MCV: 99.7 fL (ref 78.0–100.0)
Platelets: 96 10*3/uL — ABNORMAL LOW (ref 150–400)
RDW: 13.1 % (ref 11.5–15.5)
WBC: 5 10*3/uL (ref 4.0–10.5)

## 2012-09-04 NOTE — Progress Notes (Signed)
Physical Therapy Session Note  Patient Details  Name: Brandon Hardy MRN: [id number] Date of Birth: [birth year]  Today's Date: 09/04/2012 Time: 7829-5621 Time Calculation (min): 45 min   Skilled Therapeutic Interventions/Progress Updates:    1:1. Pt req close (S) to min A for lower body dressing this AM, combination of sitting and standing w/ RW. Focus majority of tx session on ambulation off unit in and outside in busy community environments. Pt able to amb x500' + w/ RW and close (S) on hard level and uneven surfaces, but req min guard during downhill ramp negotiation x2. Pt demonstrates slow, but steady pace w/ shuffled step pattern especially when conversing. Pt consistently cued to take larger steps and intermittent cues to attend to obstacles in environment. Pt able to safely negotiate RW over cracks in ground as well as elevator threshold. Pt also able to negotiate up 6, 6" stairs using step-to pattern in community w/ use of L rail and able to manage RW as well by angling between two steps, req min guard for balance. Pt w/ overall good functional endurance and demonstration of safety during standing functional mobility in community environments this tx session. Pt sitting in recliner at end of tx session w/ all needs in reach, pt verbalized understanding of needing to call for assistance to ambulate in room.   Therapy Documentation Precautions:  Precautions Precautions: Fall Precaution Comments: L inattention Restrictions Weight Bearing Restrictions: No  See FIM for current functional status  Therapy/Group: Individual Therapy  Denzil Hughes 09/04/2012, 9:23 AM

## 2012-09-04 NOTE — Patient Care Conference (Signed)
Inpatient RehabilitationTeam Conference and Plan of Care Update Date: 09/04/2012   Time: 10:45 Am    Patient Name: Brandon Hardy      Medical Record Number: [id number]  Date of Birth: [birth year] Sex: Male         Room/Bed: 4M04C/4M04C-01 Payor Info: Payor: Multimedia programmer / Plan: [employer] MEDICARE COMPLETE / Product Type: *No Product type* /    Admitting Diagnosis: R CVA  Admit Date/Time:  08/28/2012  2:48 PM Admission Comments: No comment available   Primary Diagnosis:  CVA (cerebral infarction) Principal Problem: CVA (cerebral infarction)  Patient Active Problem List   Diagnosis Date Noted  . History of alcohol abuse 08/30/2012  . Thrombocytopenia, unspecified 08/28/2012  . Chronic back pain 08/28/2012  . PFO (patent foramen ovale) 08/28/2012  . Unspecified constipation 08/28/2012  . Acute left-sided muscle weakness 08/23/2012  . Hypokalemia 08/23/2012  . CVA (cerebral infarction) 08/23/2012  . Cubital tunnel syndrome on right 01/09/2012  . Right groin pain 09/19/2011  . Gait disturbance 07/30/2011  . Fall 07/30/2011  . Humerus fracture 07/30/2011  . GERD (gastroesophageal reflux disease) 07/30/2011  . BPH (benign prostatic hyperplasia) 07/30/2011  . Leg edema 07/30/2011    Expected Discharge Date: Expected Discharge Date: 09/05/12  Team Members Present: Physician leading conference: Dr. Claudette Laws Social Worker Present: Staci Acosta, LCSW;Becky Ralyn Stlaurent, LCSW Nurse Present: Gregor Hams, RN PT Present: Edman Circle, Lillie Columbia, PT OT Present: Bretta Bang, OT;Sarah Hoxie, Carollee Sires, OT SLP Present: Fae Pippin, SLP PPS Coordinator present : Tora Duck, RN, CRRN     Current Status/Progress Goal Weekly Team Focus  Medical   Chronic quadriplegia secondary to spinal cord compression, superimposed stroke  Upgrade functional status to premorbid  Establish post discharge care   Bowel/Bladder   Continent of bowel and bladder. Uses  urinals. bathroom priveleges.LBM 9/1. Senokot given as per scheduled.  Remain continent of bowel and baldder.  Monitor any s/s of constipation/diarrhea q shift.   Swallow/Nutrition/ Hydration     WFL        ADL's   supervision-mod I for self-care tasks   Mod I overall   plans to d/c 9/4; continue working towards Mod I goals and safety awareness    Mobility   mod I all mobility, S stairs and community amb; ready for d/c  mod I all mobility, S stairs and community amb  safety, transfers, gait, balance, stairs, strengthening, activity tolerance, balance   Communication     [facility]        Safety/Cognition/ Behavioral Observations    NA        Pain   Pain to right upper extremities and lower back. Acetaminophen with codeine Tylenol # 3 given PRN with good relief noted.  Pain level 3 or less on a scale of 0-10  Monitor any onset of pain q shift. Assess for effectiveness of PRN pain meds.   Skin   Left elbow skin tear with tegaderm. Fragile skin. Bruises to arms, belly and hands  No new skin infection/ breakdwon.         *See Care Plan and progress notes for long and short-term goals.  Barriers to Discharge: See above    Possible Resolutions to Barriers:  See above, should be ready for discharge 09/05/2012    Discharge Planning/Teaching Needs:  Pt reaching his goals ready for d/c Thurs.  family and pt aware and are agreeable      Team Discussion:  Reached goals and ready for d/c tomorrow.  Mod/i in his room today. No driving-MD addressed with him  Revisions to Treatment Plan:  None   Continued Need for Acute Rehabilitation Level of Care: The patient requires daily medical management by a physician with specialized training in physical medicine and rehabilitation for the following conditions: Daily direction of a multidisciplinary physical rehabilitation program to ensure safe treatment while eliciting the highest outcome that is of practical value to the patient.: Yes Daily medical  management of patient stability for increased activity during participation in an intensive rehabilitation regime.: Yes Daily analysis of laboratory values and/or radiology reports with any subsequent need for medication adjustment of medical intervention for : Neurological problems;Post surgical problems  Kashae Carstens, Lemar Livings 09/04/2012, 1:47 PM

## 2012-09-04 NOTE — Progress Notes (Signed)
Physical Therapy Discharge Summary  Patient Details  Name: Brandon Hardy MRN: [id number] Date of Birth: 1927-07-27  Today's Date: 09/04/2012 Time: 1307-1400 Time Calculation (min): 53 min  Patient has met 10 of 10 long term goals due to improved activity tolerance, improved balance, improved postural control, increased strength, ability to compensate for deficits, improved attention, improved awareness and improved coordination.  Patient to discharge at an ambulatory level Modified Independent.   Patient's care partner not necessary secondary to patient discharging at mod I level to provide the necessary cognitive assistance at discharge.  Reasons goals not met: N/A, all LTGs met.  Recommendation:  Patient will benefit from ongoing skilled PT services in home health setting to continue to advance safe functional mobility, address ongoing impairments in postural control, balance, strength, attention, awareness, coordination, and gait, and minimize fall risk.  Equipment: No equipment provided  Reasons for discharge: treatment goals met and discharge from hospital  Patient/family agrees with progress made and goals achieved: Yes  Patient completes floor transfer with supervision.  PT Discharge Precautions/Restrictions Precautions Precautions: None Precaution Comments: L inattention  Restrictions Weight Bearing Restrictions: No Pain Pain Assessment Pain Assessment: No/denies pain Pain Score: 0-No pain Vision/Perception  Vision - History Baseline Vision: Bifocals Visual History: Cataracts Patient Visual Report: No change from baseline Vision - Assessment Eye Alignment: Within Functional Limits  Cognition Overall Cognitive Status: Within Functional Limits for tasks assessed Arousal/Alertness: Awake/alert Orientation Level: Oriented X4 Memory: Appears intact Awareness: Appears intact Problem Solving: Appears intact Safety/Judgment: Impaired Sensation Sensation Light  Touch: Appears Intact Proprioception: Appears Intact Additional Comments: Proprioception intact B ankles and great toes. Coordination Gross Motor Movements are Fluid and Coordinated: Yes Fine Motor Movements are Fluid and Coordinated: Yes Coordination and Movement Description: decreased speed and accuracy with rapid, alternating movements Heel Shin Test: slight dysmetria L LE Motor  Motor Motor: Within Functional Limits  Mobility Bed Mobility Bed Mobility: Supine to Sit;Sit to Supine Supine to Sit: HOB flat;6: Modified independent (Device/Increase time) Sit to Supine: HOB flat;6: Modified independent (Device/Increase time) Transfers Transfers: Yes Sit to Stand: 6: Modified independent (Device/Increase time);With upper extremity assist;With armrests;From bed;From chair/3-in-1;From toilet Stand to Sit: 6: Modified independent (Device/Increase time);With armrests;With upper extremity assist;To bed;To chair/3-in-1;To toilet Stand Pivot Transfers: 6: Modified independent (Device/Increase time);With armrests Locomotion  Ambulation Ambulation: Yes Ambulation/Gait Assistance: 6: Modified independent (Device/Increase time) Ambulation Distance (Feet): 200 Feet Assistive device: Rolling walker Ambulation/Gait Assistance Details: 200'x2 with RW and mod I in controlled environment and 25' x2 in home environment with RW and mod I Gait Gait: Yes Gait Pattern: Step-through pattern;Narrow base of support;Decreased step length - right Stairs / Additional Locomotion Stairs: Yes Stairs Assistance: 5: Supervision Stairs Assistance Details: Verbal cues for precautions/safety Stairs Assistance Details (indicate cue type and reason): 4 with B handrails and 2 without handrails and use of RW Stair Management Technique: Step to pattern;Forwards;Two rails;With walker;No rails Number of Stairs: 5 Height of Stairs: 6 Curb: 5: Supervision (x2 trials w/ RW to simulate STE home in garage) Wheelchair  Mobility Wheelchair Mobility: No  Trunk/Postural Assessment  Cervical Assessment Cervical Assessment: Exceptions to [facility] (forward head posture) Thoracic Assessment Thoracic Assessment: Within Functional Limits Lumbar Assessment Lumbar Assessment: Within Functional Limits Postural Control Postural Control: Within Functional Limits  Balance Balance Balance Assessed: Yes Static Sitting Balance Static Sitting - Balance Support: Feet supported;No upper extremity supported Static Sitting - Level of Assistance: 6: Modified independent (Device/Increase time) Dynamic Sitting Balance Dynamic Sitting - Balance Support: Feet supported;No upper  extremity supported;During functional activity Dynamic Sitting - Level of Assistance: 6: Modified independent (Device/Increase time) Static Standing Balance Static Standing - Balance Support: Bilateral upper extremity supported;During functional activity Static Standing - Level of Assistance: 6: Modified independent (Device/Increase time) Dynamic Standing Balance Dynamic Standing - Balance Support: Bilateral upper extremity supported;During functional activity Dynamic Standing - Level of Assistance: 6: Modified independent (Device/Increase time) Extremity Assessment  RLE Assessment RLE Assessment: Within Functional Limits (Grossly 4/5) LLE Assessment LLE Assessment: Within Functional Limits (Grossly 4/5)  See FIM for current functional status  Stelios Kirby S Zian Mohamed S. Hansen Carino, PT, DPT 09/04/2012, 4:47 PM

## 2012-09-04 NOTE — Progress Notes (Signed)
Patient ID: VERLAN GROTZ, male   DOB: 1927-05-28, 79 y.o.   MRN: [id number] Subjective/Complaints: 79 y.o. right-handed male with history of OA, chronic back pain, gait disorder; who was admitted 08/23/2012 with acute left-sided weakness while mowing his lawn. MRI of the brain showed acute right MCA territory infarction. Hypokalemia 3.3 with supplement added. MRA of the head with no proximal stenosis or aneurysm. Echocardiogram with ejection fraction of 55% without emboli. Carotid Dopplers with no ICA stenosis. TEE pending. Patient did not receive TPA. Patient has a history of GIB due to ulcer and esophageal bleed (secondary to ETOH) along with thrombocytopenia-- platelet 89,000 on admission. UDS positive for opiates. Neurology services consulted and recommended low dose aspirin therapy for CVA prophylaxis. TEE with tiny PFO and BLE dopplers without evidence of DVT  No pain c/os No further BMs  Review of Systems  Neurological: Positive for sensory change.  All other systems reviewed and are negative.     Objective: Vital Signs: Blood pressure 124/55, pulse 48, temperature 98 F (36.7 C), temperature source Oral, resp. rate 19, height 5\' 7"  (1.702 m), weight 62.914 kg (138 lb 11.2 oz), SpO2 96.00%. No results found. Results for orders placed during the hospital encounter of 08/28/12 (from the past 72 hour(s))  OCCULT BLOOD X 1 CARD TO LAB, STOOL     Status: None   Collection Time    09/02/12  7:18 PM      Result Value Range   Fecal Occult Bld NEGATIVE  NEGATIVE  OCCULT BLOOD X 1 CARD TO LAB, STOOL     Status: None   Collection Time    09/02/12  8:30 PM      Result Value Range   Fecal Occult Bld NEGATIVE  NEGATIVE  CBC     Status: Abnormal   Collection Time    09/04/12  6:30 AM      Result Value Range   WBC 5.0  4.0 - 10.5 K/uL   RBC 3.17 (*) 4.22 - 5.81 MIL/uL   Hemoglobin 10.9 (*) 13.0 - 17.0 g/dL   HCT 84.1 (*) 32.4 - 40.1 %   MCV 99.7  78.0 - 100.0 fL   MCH 34.4 (*) 26.0 -  34.0 pg   MCHC 34.5  30.0 - 36.0 g/dL   RDW 02.7  25.3 - 66.4 %   Platelets 96 (*) 150 - 400 K/uL   Comment: CONSISTENT WITH PREVIOUS RESULT      Vitals reviewed.  Constitutional: He is oriented to person, place, and time.  HENT:  Head: Normocephalic.  Eyes: EOM are normal.  Neck: Normal range of motion. Neck supple. No thyromegaly present.  Cardiovascular: Regular rhythm.  Pulmonary/Chest: Effort normal and breath sounds normal. No respiratory distress.  Abdominal: Soft. Bowel sounds are normal. He exhibits no distension.  Neurological: He is alert and oriented to person, place, and time.  Follows commands. Very alert. Mild left central 7. Mild left pronator drift. Strength nearly symmetrical in all 4's at 4/5. No gross sensory deficits. Mild dysmetria on Left side Skin: Skin is warm and dry.  Psychiatric: He has a normal mood and affect.    Assessment/Plan: 1. Functional deficits secondary to Right MCA infarct which require 3+ hours per day of interdisciplinary therapy in a comprehensive inpatient rehab setting. Physiatrist is providing close team supervision and 24 hour management of active medical problems listed below. Physiatrist and rehab team continue to assess barriers to discharge/monitor patient progress toward functional and medical goals. FIM: FIM -  Bathing Bathing Steps Patient Completed: Right Arm;Chest;Buttocks;Right upper leg;Left Arm;Left upper leg;Abdomen;Left lower leg (including foot);Right lower leg (including foot);Front perineal area Bathing: 6: More than reasonable amount of time  FIM - Upper Body Dressing/Undressing Upper body dressing/undressing steps patient completed: Thread/unthread right sleeve of pullover shirt/dresss;Thread/unthread left sleeve of pullover shirt/dress;Put head through opening of pull over shirt/dress;Pull shirt over trunk Upper body dressing/undressing: 6: More than reasonable amount of time FIM - Lower Body  Dressing/Undressing Lower body dressing/undressing steps patient completed: Pull pants up/down;Thread/unthread right pants leg;Thread/unthread left pants leg;Don/Doff right sock;Don/Doff left sock;Thread/unthread right underwear leg;Pull underwear up/down;Thread/unthread left underwear leg;Fasten/unfasten pants Lower body dressing/undressing: 5: Supervision: Safety issues/verbal cues  FIM - Toileting Toileting steps completed by patient: Adjust clothing prior to toileting;Performs perineal hygiene Toileting Assistive Devices: Grab bar or rail for support Toileting: 4: Steadying assist  FIM - Diplomatic Services operational officer Devices: Best boy Transfers: 4-From toilet/BSC: Min A (steadying Pt. > 75%)  FIM - Banker Devices: Walker;Arm rests Bed/Chair Transfer: 5: Bed > Chair or W/C: Supervision (verbal cues/safety issues);5: Chair or W/C > Bed: Supervision (verbal cues/safety issues)  FIM - Locomotion: Wheelchair Locomotion: Wheelchair: 0: Activity did not occur FIM - Locomotion: Ambulation Locomotion: Ambulation Assistive Devices: Designer, industrial/product Ambulation/Gait Assistance: 5: Supervision Locomotion: Ambulation: 5: Travels 150 ft or more with supervision/safety issues  Comprehension Comprehension Mode: Auditory Comprehension: 4-Understands basic 75 - 89% of the time/requires cueing 10 - 24% of the time  Expression Expression Mode: Verbal Expression: 5-Expresses basic needs/ideas: With extra time/assistive device  Social Interaction Social Interaction: 5-Interacts appropriately 90% of the time - Needs monitoring or encouragement for participation or interaction.  Problem Solving Problem Solving: 4-Solves basic 75 - 89% of the time/requires cueing 10 - 24% of the time  Memory Memory: 5-Recognizes or recalls 90% of the time/requires cueing < 10% of the time  Medical Problem List and Plan:  1. DVT  Prophylaxis/Anticoagulation: Mechanical: Sequential compression devices, below knee Bilateral lower extremities  2. Chronic back pain/ Pain Management: Cervical and lumbar post lami syndrome Was started on prednisone in the past month by Dr. Noel Gerold and this has helped some. Continue chronic steroids and neurontin.  3. Mood: Will have LCSW follow for evaluation.  4. Neuropsych: This patient is capable of making decisions on his own behalf.  5. Thrombocytopenia: Will monitor for signs of bleeding/routine check of CBC. Reports platelets normally in 80's range.  6. GERD: Protonix is ineffective and causing worsening of symptoms. Will resume home Prilosec.  7.  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

## 2012-09-04 NOTE — Progress Notes (Signed)
Social Work Patient ID: Brandon Hardy, male   DOB: 1927-09-17, 79 y.o.   MRN: [id number] Met with pt and son aware of discharge tomorrow-team conference confirmed.  Pt very pleased with being allowed to be mod/i level in his room. He is ready to go home and do his activities.  Daughter in-law to assist at home.  See son when here tomorrow with any last minute questions.

## 2012-09-04 NOTE — Progress Notes (Signed)
Occupational Therapy Discharge Summary  Patient Details  Name: Brandon Hardy MRN: [id number] Date of Birth: 1927-03-17  Today's Date: 09/04/2012 Time: 4332-9518 and 1400-1430 Time Calculation (min): 56 min and 30 min    Patient has met 10 of 10 long term goals due to improved activity tolerance, improved balance, postural control, improved attention, improved awareness and improved coordination.  Patient to discharge at overall Modified Independent level.  Patient's care partner not neccessary as patient discharging at Modified Independent level to provide the necessary physical and cognitive assistance at discharge.    Reasons goals not met: N/A  Recommendation:  Patient will benefit from ongoing skilled OT services in home health setting to continue to advance functional skills in the area of BADL and iADL.  Equipment: No equipment provided  Reasons for discharge: treatment goals met  Patient/family agrees with progress made and goals achieved: Yes  Skilled Therapeutic Intervention: Session 1: Therapy session focused on ADL retraining at Mod I level, safety awareness, functional transfers, and dynamic standing balance when reaching to floor. Pt received sitting in bed and declined bathing however willing to participate in dressing and functional transfers. Pt completed dressing, tub transfer, toilet task, and toilet transfer at Mod I level. Made pt Mod I in controlled room environment and discussed safety. Informed nursing of this. Engaged in dynamic standing activity of reaching to floor to retrieve cones that were spread across gym. Pt required verbal cues on 1 occasions for pivoting with RW rather then turning RW then turning body. Pt with good carryover of this through remainder of activity. Pt with good safety of getting close to item before reaching towards floor to retrieve with LUE and RUE as support on RW. Pt ambulated back to room and left with all needs in reach.   Session 2:  Therapy session focused on carryover of safety and dynamic standing balance during higher level IADL tasks, activity tolerance, and strength. Pt engaged in vacuuming task with supervision and min verbal cues for safety technique. No lob noted and some L inattention noted with RW. Discussed safety if pt to fall and able to verbalize all steps. Pt engaged in NuStep for 8 min with workload of 4 to increase strength and activity tolerance.    OT Discharge Precautions/Restrictions  Precautions Precautions: Fall Precaution Comments: L inattention  Restrictions Weight Bearing Restrictions: No General   Vital Signs   Pain Pain Assessment Pain Assessment: 0-10 Pain Score: 2  Pain Type: Acute pain Pain Location: Shoulder Pain Orientation: Right Pain Descriptors / Indicators: Aching Pain Frequency: Intermittent Pain Onset: Gradual Patients Stated Pain Goal: 3 Pain Intervention(s): Medication (See eMAR);Repositioned Multiple Pain Sites: No ADL   Vision/Perception  Vision - History Baseline Vision: Bifocals Visual History: Cataracts Patient Visual Report: No change from baseline Vision - Assessment Eye Alignment: Within Functional Limits  Cognition Orientation Level: Oriented X4 Sensation Sensation Light Touch: Appears Intact Coordination Gross Motor Movements are Fluid and Coordinated: Yes Fine Motor Movements are Fluid and Coordinated: Yes Motor    Mobility     Trunk/Postural Assessment     Balance   Extremity/Trunk Assessment RUE Assessment RUE Assessment: Within Functional Limits LUE Assessment LUE Assessment: Within Functional Limits  See FIM for current functional status  Kelsye Loomer, Vara Guardian 09/04/2012, 10:54 AM

## 2012-09-05 MED ORDER — ASPIRIN 81 MG PO TBEC: 81.0000 mg | DELAYED_RELEASE_TABLET | Freq: Every day | ORAL | Status: AC

## 2012-09-05 MED ORDER — SENNOSIDES-DOCUSATE SODIUM 8.6-50 MG PO TABS: 1.0000 | ORAL_TABLET | Freq: Two times a day (BID) | ORAL | Status: AC

## 2012-09-05 NOTE — Progress Notes (Signed)
Patient ID: Brandon Hardy, male   DOB: [birth year], 79 y.o.   MRN: [id number] Subjective/Complaints: 79 y.o. right-handed male with history of OA, chronic back pain, gait disorder; who was admitted 08/23/2012 with acute left-sided weakness while mowing his lawn. MRI of the brain showed acute right MCA territory infarction. Hypokalemia 3.3 with supplement added. MRA of the head with no proximal stenosis or aneurysm. Echocardiogram with ejection fraction of 55% without emboli. Carotid Dopplers with no ICA stenosis. TEE pending. Patient did not receive TPA. Patient has a history of GIB due to ulcer and esophageal bleed (secondary to ETOH) along with thrombocytopenia-- platelet 89,000 on admission. UDS positive for opiates. Neurology services consulted and recommended low dose aspirin therapy for CVA prophylaxis. TEE with tiny PFO and BLE dopplers without evidence of DVT  Excited about D/C day  Review of Systems  Neurological: Positive for sensory change.  All other systems reviewed and are negative.     Objective: Vital Signs: Blood pressure 117/64, pulse 57, temperature 97.1 F (36.2 C), temperature source Oral, resp. rate 17, height 5\' 7"  (1.702 m), weight 62.914 kg (138 lb 11.2 oz), SpO2 100.00%. No results found. Results for orders placed during the hospital encounter of 08/28/12 (from the past 72 hour(s))  OCCULT BLOOD X 1 CARD TO LAB, STOOL     Status: None   Collection Time    09/02/12  7:18 PM      Result Value Range   Fecal Occult Bld NEGATIVE  NEGATIVE  OCCULT BLOOD X 1 CARD TO LAB, STOOL     Status: None   Collection Time    09/02/12  8:30 PM      Result Value Range   Fecal Occult Bld NEGATIVE  NEGATIVE  CBC     Status: Abnormal   Collection Time    09/04/12  6:30 AM      Result Value Range   WBC 5.0  4.0 - 10.5 K/uL   RBC 3.17 (*) 4.22 - 5.81 MIL/uL   Hemoglobin 10.9 (*) 13.0 - 17.0 g/dL   HCT 40.9 (*) 81.1 - 91.4 %   MCV 99.7  78.0 - 100.0 fL   MCH 34.4 (*) 26.0 - 34.0  pg   MCHC 34.5  30.0 - 36.0 g/dL   RDW 78.2  95.6 - 21.3 %   Platelets 96 (*) 150 - 400 K/uL   Comment: CONSISTENT WITH PREVIOUS RESULT      Vitals reviewed.  Constitutional: He is oriented to person, place, and time.  HENT:  Head: Normocephalic.  Eyes: EOM are normal.  Neck: Normal range of motion. Neck supple. No thyromegaly present.  Cardiovascular: Regular rhythm.  Pulmonary/Chest: Effort normal and breath sounds normal. No respiratory distress.  Abdominal: Soft. Bowel sounds are normal. He exhibits no distension.  Neurological: He is alert and oriented to person, place, and time.  Follows commands. Very alert. Mild left central 7. Mild left pronator drift. Strength nearly symmetrical in all 4's at 4/5. No gross sensory deficits. Mild dysmetria on Left side Skin: Skin is warm and dry.  Psychiatric: He has a normal mood and affect.    Assessment/Plan: 1. Functional deficits secondary to Right MCA infarct which require 3+ hours per day of interdisciplinary therapy in a comprehensive inpatient rehab setting. Physiatrist is providing close team supervision and 24 hour management of active medical problems listed below. Physiatrist and rehab team continue to assess barriers to discharge/monitor patient progress toward functional and medical goals. FIM: FIM - Bathing  Bathing Steps Patient Completed: Right Arm;Chest;Buttocks;Right upper leg;Left Arm;Left upper leg;Abdomen;Left lower leg (including foot);Right lower leg (including foot);Front perineal area Bathing: 6: More than reasonable amount of time  FIM - Upper Body Dressing/Undressing Upper body dressing/undressing steps patient completed: Thread/unthread right sleeve of pullover shirt/dresss;Thread/unthread left sleeve of pullover shirt/dress;Put head through opening of pull over shirt/dress;Pull shirt over trunk Upper body dressing/undressing: 6: More than reasonable amount of time FIM - Lower Body Dressing/Undressing Lower  body dressing/undressing steps patient completed: Don/Doff left shoe;Don/Doff right shoe;Fasten/unfasten pants;Pull pants up/down;Thread/unthread left pants leg;Thread/unthread right pants leg Lower body dressing/undressing: 6: More than reasonable amount of time  FIM - Toileting Toileting steps completed by patient: Adjust clothing prior to toileting;Performs perineal hygiene;Adjust clothing after toileting Toileting Assistive Devices: Grab bar or rail for support Toileting: 6: More than reasonable amount of time  FIM - Diplomatic Services operational officer Devices: Best boy Transfers: 6-More than reasonable amt of time;6-To toilet/ BSC;6-From toilet/BSC  FIM - Banker Devices: Environmental consultant;Arm rests Bed/Chair Transfer: 6: Assistive device: no helper;6: Sit > Supine: No assist;6: Supine > Sit: No assist;6: Chair or W/C > Bed: No assist;6: Bed > Chair or W/C: No assist  FIM - Locomotion: Wheelchair Locomotion: Wheelchair: 0: Activity did not occur FIM - Locomotion: Ambulation Locomotion: Ambulation Assistive Devices: Designer, industrial/product Ambulation/Gait Assistance: 6: Modified independent (Device/Increase time) Locomotion: Ambulation: 6: Travels 150 ft or more with assistive device/no helper  Comprehension Comprehension Mode: Auditory Comprehension: 4-Understands basic 75 - 89% of the time/requires cueing 10 - 24% of the time  Expression Expression Mode: Verbal Expression: 5-Expresses basic needs/ideas: With no assist  Social Interaction Social Interaction: 5-Interacts appropriately 90% of the time - Needs monitoring or encouragement for participation or interaction.  Problem Solving Problem Solving: 4-Solves basic 75 - 89% of the time/requires cueing 10 - 24% of the time  Memory Memory: 5-Recognizes or recalls 90% of the time/requires cueing < 10% of the time  Medical Problem List and Plan:  1. DVT  Prophylaxis/Anticoagulation: Mechanical: Sequential compression devices, below knee Bilateral lower extremities  2. Chronic back pain/ Pain Management: Cervical and lumbar post lami syndrome Was started on prednisone in the past month by Dr. Noel Gerold and this has helped some. Continue chronic steroids and neurontin.  3. Mood: Will have LCSW follow for evaluation.  4. Neuropsych: This patient is capable of making decisions on his own behalf.  5. Thrombocytopenia: Will monitor for signs of bleeding/routine check of CBC. Reports platelets normally in 80's range.  6. GERD: Protonix is ineffective and causing worsening of symptoms. Will resume home Prilosec.  7.  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

## 2012-09-05 NOTE — Progress Notes (Signed)
Social Work Discharge Note Discharge Note  The overall goal for the admission was met for:   Discharge location: Yes-HOME WITH WIFE AND D-I-L TO PROVIDE SUPERVISION LEVEL  Length of Stay: Yes-8 DAYS  Discharge activity level: Yes-SUPERVISION/MOD/I LEVEL  Home/community participation: Yes  Services provided included: MD, RD, PT, OT, RN, Pharmacy and SW  Financial Services: Private Insurance: [employer] MEDICARE  Follow-up services arranged: Home Health: [facility]-PT,OT and Patient/Family has no preference for HH/DME agencies  Comments (or additional information):FAMILY EDUCATION COMPLETED PT READY FOR D/C.  AWARE NO DRIVING UNTIL FOLLOWS UP WITH DR Wynn Banker  Patient/Family verbalized understanding of follow-up arrangements: Yes  Individual responsible for coordination of the follow-up plan: SELF & KEN-SON  Confirmed correct DME delivered: Lucy Chris 09/05/2012    Lucy Chris

## 2012-09-05 NOTE — Discharge Summary (Signed)
Physician Discharge Summary  Patient ID: Brandon Hardy MRN: [id number] DOB/AGE: [birth year] 79 y.o.  Admit date: 08/28/2012 Discharge date: 09/05/2012  Discharge Diagnoses:  Principal Problem:   CVA (cerebral infarction) Active Problems:   GERD (gastroesophageal reflux disease)   Thrombocytopenia, unspecified   Chronic back pain   History of alcohol abuse   Discharged Condition: Improved.    Labs:  Basic Metabolic Panel:    Component Value Date/Time   NA 140 08/29/2012 0600   K 4.0 08/29/2012 0600   CL 108 08/29/2012 0600   CO2 26 08/29/2012 0600   GLUCOSE 90 08/29/2012 0600   BUN 22 08/29/2012 0600   CREATININE 1.05 08/29/2012 0600   CALCIUM 8.5 08/29/2012 0600   GFRNONAA 64* 08/29/2012 0600   GFRAA 74* 08/29/2012 0600      CBC:  Recent Labs Lab 09/04/12 0630  WBC 5.0  HGB 10.9*  HCT 31.6*  MCV 99.7  PLT 96*    Filed Vitals:   09/05/12 0549  BP: 117/64  Pulse: 57  Temp: 97.1 F (36.2 C)  Resp: 17     Brief HPI:   Brandon Hardy is a 79 y.o. right-handed male with history of OA, chronic back pain, gait disorder; who was admitted 08/23/2012 with acute left-sided weakness while mowing his lawn. MRI of the brain showed acute right MCA territory infarction.  MRA of the head with no proximal stenosis or aneurysm.  Patient has a history of GIB due to ulcer and esophageal bleed (secondary to ETOH) along with thrombocytopenia-- platelet 89,000 on admission. UDS positive for opiates. Neurology services consulted and recommended low dose aspirin therapy for CVA prophylaxis. TEE with tiny PFO and BLE dopplers negative for DVT. Patient continues with left inattention with poor awareness, poor insight and reported to be resistant to redirection. Therapy team recommended CIR and patient was admitted for inpatient program.    Hospital Course: Brandon Hardy was admitted to rehab 08/28/2012 for inpatient therapies to consist of PT, ST and OT at least three hours five days a week.  Past admission physiatrist, therapy team and rehab RN have worked together to provide customized collaborative inpatient rehab. He was maintained on low dose ASA and has tolerated this without any SE.  H/H has been stable. Thrombocytopenia has been monitored and platelets are at baseline. Po intake has been good. Blood pressure has been controlled. He was advised to decrease prednisone to 10 mg or follow up with Dr. Noel Gerold for input on dosing. His awareness and insight have improved and he has been advised on avoiding driving till follow up with Dr. Wynn Banker past discharge. He has made good progress and is modified independent in supervised setting.  Balance has improved with minimal left inattention with certain activities.    Rehab course: During patient's stay in rehab weekly team conferences were held to monitor patient's progress, set goals and discuss barriers to discharge. He is modified independent for transfers and is able to ambulate 200 feet X 2 with RW. He is able to navigate 5 stairs with supervision. He is modified independent for ADL and requires supervsion with simple home management  tasks   Disposition:  Home  Diet: Heart healthy.   Special Instructions: 1. [facility] for follow up PT, OT. 2. Follow up with Dr Noel Gerold for pain management.     Future Appointments Provider Department Dept Phone   09/20/2012 2:00 PM [facility] Colace, [facility] [phone]  Medication List    STOP taking these medications       docusate sodium 100 MG capsule  Commonly known as:  COLACE      TAKE these medications       aspirin 81 MG EC tablet  Take 1 tablet (81 mg total) by mouth daily.     gabapentin 100 MG capsule  Commonly known as:  NEURONTIN  Take 100 mg by mouth 2 (two) times daily.     omeprazole 20 MG capsule  Commonly known as:  PRILOSEC  Take 20 mg by mouth daily.     predniSONE 10 MG tablet  Commonly known as:  DELTASONE  Take  10 mg by mouth daily.     riboflavin 100 MG Tabs tablet  Commonly known as:  VITAMIN B-2  Take 100 mg by mouth daily.     senna-docusate 8.6-50 MG per tablet  Commonly known as:  Senokot-S  Take 1 tablet by mouth 2 (two) times daily.           Follow-up Information   Follow up with Erick Colace, MD On 09/20/2012. (Be there at 1:30 pm   for 2 pm     appointment)    Specialty:  Physical Medicine and Rehabilitation   Contact information:   [address] [place] [address] [phone]       Follow up with Gaye Alken, MD On 09/13/2012. (APPT: 11:30 AM)    Specialty:  Family Medicine   Contact information:   [address] [place] [address] [phone]       Signed: Jacquelynn Cree 09/05/2012, 10:31 AM

## 2012-09-05 NOTE — Progress Notes (Signed)
Patient and son received verbal and written discharge instructions from Marissa Nestle, [place]. Aware of follow up appointments, medications, restrictions, etc, denies any questions or concerns, personal belongings packed by pt and staff, pt taken down to private vehicle by NT via wheelchair. Brandon Hardy, Brandon Hardy

## 2012-09-20 ENCOUNTER — Inpatient Hospital Stay: Payer: Medicare Other | Admitting: Physical Medicine & Rehabilitation

## 2012-09-23 ENCOUNTER — Encounter: Payer: Self-pay | Admitting: Physical Medicine & Rehabilitation

## 2012-09-23 ENCOUNTER — Ambulatory Visit (HOSPITAL_BASED_OUTPATIENT_CLINIC_OR_DEPARTMENT_OTHER): Payer: Medicare Other | Admitting: Physical Medicine & Rehabilitation

## 2012-09-23 ENCOUNTER — Encounter: Payer: Medicare Other | Attending: Physical Medicine & Rehabilitation

## 2012-09-23 VITALS — BP 148/53 | HR 60 | Resp 14 | Ht 65.0 in | Wt 146.4 lb

## 2012-09-23 DIAGNOSIS — M1990 Unspecified osteoarthritis, unspecified site: Secondary | ICD-10-CM | POA: Insufficient documentation

## 2012-09-23 DIAGNOSIS — M549 Dorsalgia, unspecified: Secondary | ICD-10-CM | POA: Insufficient documentation

## 2012-09-23 DIAGNOSIS — I69993 Ataxia following unspecified cerebrovascular disease: Secondary | ICD-10-CM

## 2012-09-23 DIAGNOSIS — R269 Unspecified abnormalities of gait and mobility: Secondary | ICD-10-CM | POA: Insufficient documentation

## 2012-09-23 DIAGNOSIS — M4712 Other spondylosis with myelopathy, cervical region: Secondary | ICD-10-CM

## 2012-09-23 DIAGNOSIS — I6330 Cerebral infarction due to thrombosis of unspecified cerebral artery: Secondary | ICD-10-CM

## 2012-09-23 DIAGNOSIS — G8929 Other chronic pain: Secondary | ICD-10-CM | POA: Insufficient documentation

## 2012-09-23 NOTE — Progress Notes (Signed)
Subjective:    Patient ID: Brandon Hardy, male    DOB: December 14, 1927, 79 y.o.   MRN: [id number] Brandon Hardy is a 79 y.o. right-handed male with history of OA, chronic back pain, gait disorder; who was admitted 08/23/2012 with acute left-sided weakness while mowing his lawn. MRI of the brain showed acute right MCA territory infarction. MRA of the head with no proximal stenosis or aneurysm. Patient has a history of GIB due to ulcer and esophageal bleed (secondary to ETOH) along with thrombocytopenia-- platelet 89,000 on admission. UDS positive for opiates. Neurology services consulted and recommended low dose aspirin therapy for CVA prophylaxis. TEE with tiny PFO and BLE dopplers negative for DVT  CIR admission: Admit date: 08/28/2012  Discharge date: 09/05/2012  HPI HHPT, HHOT came out but no longer coming out Brandon Hardy used outside the home [place] use in the house No falls. Has been to church. Independent with ADLs Modified independent with ambulation  Has followed PCP Brandon Hardy no change in meds Patient states he was driving prior to the stroke and would like to resume this.  Pain Inventory Average Pain 4 Pain Right Now 4 My pain is aching  In the last 24 hours, has pain interfered with the following? General activity 3 Relation with others 3 Enjoyment of life 3 What TIME of day is your pain at its worst? morning Sleep (in general) Good  Pain is worse with: walking, bending and standing Pain improves with: rest Relief from Meds: 8  Mobility walk with assistance use a cane use a walker ability to climb steps?  yes do you drive?  no  Function not employed: date last employed 1989 retired I need assistance with the following:  meal prep, household duties and shopping  Neuro/Psych weakness numbness tingling  Prior Studies Any changes since last visit?  no  Physicians involved in your care Any changes since last visit?  no   History reviewed. No pertinent  family history. History   Social History  . Marital Status: Married    Spouse Name: N/A    Number of Children: N/A  . Years of Education: N/A   Social History Main Topics  . Smoking status: Former Smoker    Types: Cigarettes    Quit date: 02/03/1973  . Smokeless tobacco: Never Used  . Alcohol Use: No  . Drug Use: No  . Sexual Activity: Not Currently   Other Topics Concern  . None   Social History Narrative  . None   Past Surgical History  Procedure Laterality Date  . Fracture surgery    . Back surgery      multiple  . Neck surgery  2013    x 2/plates, screws, decreased mobility  . Left finger surgery    . Foot surgery    . Colonoscopy    . Esophagogastroduodenoscopy    . Gi bleed    . Ulnar nerve transposition  01/09/2012    Procedure: ULNAR NERVE DECOMPRESSION/TRANSPOSITION;  Surgeon: Kathryne Hitch, MD;  Location: [facility] OR;  Service: Orthopedics;  Laterality: Right;  Right ulnar nerve decompression at the elbow/cubital tunnel and transposition  . Inguinal hernia repair Right 04/23/2012    Procedure: HERNIA REPAIR INGUINAL ADULT;  Surgeon: Kandis Cocking, MD;  Location: [facility] ORS;  Service: General;  Laterality: Right;  . Insertion of mesh Right 04/23/2012    Procedure: INSERTION OF MESH;  Surgeon: Kandis Cocking, MD;  Location: [facility] ORS;  Service: General;  Laterality: Right;  .  Tee without cardioversion N/A 08/26/2012    Procedure: TRANSESOPHAGEAL ECHOCARDIOGRAM (TEE);  Surgeon: Pricilla Riffle, MD;  Location: [facility] ENDOSCOPY;  Service: Cardiovascular;  Laterality: N/A;   Past Medical History  Diagnosis Date  . Arthritis   . Blood transfusion   . Renal disorder   . Thrombocytopenia   . Heart murmur   . Chronic back pain   . Peripheral edema     was on Lasix but has been off for about 3months  . GERD (gastroesophageal reflux disease)     takes Prilosec daily  . GI bleed     hx of   . Constipation     takes Senna daily and eats Metamucil daily  . Urinary frequency    . History of kidney stones   . History of blood transfusion     no abnormal reaction noted  . Cataracts, bilateral     immature  . History of blood clots     left leg  . History of Clostridium difficile infection   . BPH (benign prostatic hyperplasia)   . Stroke 08/22/2012  . Esophageal varices with bleeding approx 20 years ago    per pt history  . Thrombocytopenia, unspecified 08/28/2012  . PFO (patent foramen ovale) 08/28/2012    Per TEE 08/2012   BP 148/53  Pulse 60  Resp 14  Ht 5\' 5"  (1.651 m)  Wt 146 lb 6.4 oz (66.407 kg)  BMI 24.36 kg/m2  SpO2 99%    Review of Systems  Cardiovascular: Positive for leg swelling.  Genitourinary: Positive for decreased urine volume.  Neurological: Positive for weakness and numbness.       Tingling   Hematological: Bruises/bleeds easily.       Objective:   Physical Exam  Motor strength 4/5 in the right deltoid, bicep, tricep, grip 5/5 in the left deltoid, bicep, tricep, grip 4/5 bilateral hip flexors knee extensors ankle dorsiflexion plantar flexor Extraocular movements are intact Visual fields are intact confrontation testing. Ambulates with a cane wide base of support, small step length. No evidence of toe drag or knee instability  General no acute distress Mood and affect are appropriate Speech without evidence of dysarthria or aphasia.      Assessment & Plan:  1.  Cervical myelopathy with chronic deficits LUE and BLE, following up with orthopedic spine surgery next month 2.R MCA branch infarct with improvement of deficits, no further formal PT/OT needed, cont HEP Balance is still not quite back to baseline however the patient declines any referral to outpatient therapy. Continue with cane in the home as well as walker outside the home 3.  F/u with PCP  Graduated return to driving instructions were provided. It is recommended that the patient first drives with another licensed driver in an empty parking lot. If the  patient does well with this, and they can drive on a quiet street with the licensed driver. If the patient does well with this they can drive on a busy street with a licensed driver. If the patient does well with this, the next time out they can go by himself. For the first month after resuming driving, I recommend no nighttime or Interstate driving.   Return to clinic when necessary

## 2012-09-23 NOTE — Patient Instructions (Signed)

## 2012-10-08 ENCOUNTER — Other Ambulatory Visit: Payer: Self-pay | Admitting: Dermatology

## 2013-04-17 ENCOUNTER — Other Ambulatory Visit: Payer: Self-pay | Admitting: Dermatology

## 2013-06-09 ENCOUNTER — Other Ambulatory Visit: Payer: Self-pay | Admitting: Physician Assistant

## 2013-06-09 ENCOUNTER — Ambulatory Visit
Admission: RE | Admit: 2013-06-09 | Discharge: 2013-06-09 | Disposition: A | Discharge status: Home / Self Care | Payer: Medicare Other | Source: Ambulatory Visit | Attending: Physician Assistant | Admitting: Physician Assistant

## 2013-06-09 DIAGNOSIS — S299XXA Unspecified injury of thorax, initial encounter: Secondary | ICD-10-CM

## 2013-08-15 ENCOUNTER — Encounter: Payer: Self-pay | Admitting: *Deleted

## 2013-08-27 ENCOUNTER — Ambulatory Visit (INDEPENDENT_AMBULATORY_CARE_PROVIDER_SITE_OTHER): Payer: Medicare Other | Admitting: Podiatry

## 2013-08-27 ENCOUNTER — Encounter: Payer: Self-pay | Admitting: Podiatry

## 2013-08-27 ENCOUNTER — Ambulatory Visit: Payer: Self-pay

## 2013-08-27 VITALS — BP 142/68 | HR 61 | Resp 16

## 2013-08-27 DIAGNOSIS — S90129A Contusion of unspecified lesser toe(s) without damage to nail, initial encounter: Secondary | ICD-10-CM

## 2013-08-27 DIAGNOSIS — S90122A Contusion of left lesser toe(s) without damage to nail, initial encounter: Secondary | ICD-10-CM

## 2013-08-27 DIAGNOSIS — M79609 Pain in unspecified limb: Secondary | ICD-10-CM

## 2013-08-27 DIAGNOSIS — B351 Tinea unguium: Secondary | ICD-10-CM

## 2013-08-27 DIAGNOSIS — M79673 Pain in unspecified foot: Secondary | ICD-10-CM

## 2013-08-27 DIAGNOSIS — L03039 Cellulitis of unspecified toe: Secondary | ICD-10-CM

## 2013-08-27 NOTE — Progress Notes (Signed)
   Subjective:    Patient ID: Brandon Hardy, male    DOB: 1927-03-26, 80 y.o.   MRN: [id number]  HPI Comments: "I dropped something on my toe"  Patient c/o aching 1st toe left since last week. He dropped something on the toe and the nail is loose and has been bleeding. The toe is red and swollen.   Also, he is concerned about the fungus in the toenails. When they get thick, they get sore.     Review of Systems  HENT: Positive for hearing loss.   Musculoskeletal: Positive for arthralgias and gait problem.  All other systems reviewed and are negative.      Objective:   Physical Exam        Assessment & Plan:

## 2013-08-27 NOTE — Patient Instructions (Signed)

## 2013-08-27 NOTE — Progress Notes (Signed)
Subjective:     Patient ID: Brandon Hardy, male   DOB: [birth year], 80 y.o.   MRN: [id number]  HPI patient states I damage my left big toenail left and it's been draining and painful on the inside and all my nails are thick and I cannot cut them and they become painful   Review of Systems  All other systems reviewed and are negative.      Objective:   Physical Exam  Nursing note and vitals reviewed. Constitutional: He is oriented to person, place, and time.  Cardiovascular: Intact distal pulses.   Musculoskeletal: Normal range of motion.  Neurological: He is oriented to person, place, and time.  Skin: Skin is warm and dry.   neurovascular status intact with muscle strength adequate and range of motion subtalar midtarsal joint within normal limits. Patient's found to have a traumatized left hallux nail with drainage on the lateral border and no proximal edema erythema or drainage noted. Remaining nails are thick and yellow brittle and painful. I also noted digits to be well perfused and is well oriented x3     Assessment:     Probable paronychia infection left hallux lateral side of the localized nature secondary to trauma with possible damaged to the entire nail plate    Plan:     H&P was performed today and today I infiltrated the left hallux 60 mg Xylocaine Marcaine mixture removed the lateral border and the nail itself was loose and cannot entirely flushed out the bed and applied sterile dressing. Patient tolerated procedure well and will be seen back for [place] to recheck again

## 2014-02-16 ENCOUNTER — Ambulatory Visit: Payer: Self-pay

## 2014-02-27 ENCOUNTER — Ambulatory Visit (INDEPENDENT_AMBULATORY_CARE_PROVIDER_SITE_OTHER): Payer: PPO | Admitting: Podiatrist

## 2014-02-27 ENCOUNTER — Encounter: Payer: Self-pay | Admitting: Podiatrist

## 2014-02-27 DIAGNOSIS — B351 Tinea unguium: Secondary | ICD-10-CM

## 2014-02-27 DIAGNOSIS — M79676 Pain in unspecified toe(s): Secondary | ICD-10-CM | POA: Diagnosis not present

## 2014-02-27 NOTE — Progress Notes (Signed)
HPI:  Patient presents today for follow up of foot and nail care. Denies any new complaints today.  Objective:  Patients chart is reviewed.  Vascular status reveals pedal pulses noted at 1 out of 4 dp and pt bilateral .  Neurological sensation is intact to [facility] bilateral.  Patients nails are thickened, discolored, distrophic, friable and brittle with yellow-brown discoloration. Patient subjectively relates they are painful with shoes and with ambulation of bilateral feet. Left hallux nail is growing back slowly  Assessment:  Symptomatic onychomycosis  Plan:  Discussed treatment options and alternatives.  The symptomatic toenails were debrided through manual an mechanical means without complication.  Return appointment recommended at routine intervals of 3 months

## 2014-06-22 ENCOUNTER — Encounter (HOSPITAL_COMMUNITY): Payer: Self-pay | Admitting: Emergency Medicine

## 2014-06-22 ENCOUNTER — Inpatient Hospital Stay (HOSPITAL_COMMUNITY): Payer: PPO

## 2014-06-22 ENCOUNTER — Observation Stay (HOSPITAL_COMMUNITY)
Admission: EM | Admit: 2014-06-22 | Discharge: 2014-06-24 | Disposition: A | Discharge status: Home / Self Care | Payer: PPO | Attending: Internal Medicine | Admitting: Internal Medicine

## 2014-06-22 ENCOUNTER — Emergency Department (HOSPITAL_COMMUNITY): Payer: PPO

## 2014-06-22 DIAGNOSIS — Z043 Encounter for examination and observation following other accident: Secondary | ICD-10-CM | POA: Diagnosis not present

## 2014-06-22 DIAGNOSIS — K5900 Constipation, unspecified: Secondary | ICD-10-CM | POA: Diagnosis not present

## 2014-06-22 DIAGNOSIS — Q2112 Patent foramen ovale: Secondary | ICD-10-CM

## 2014-06-22 DIAGNOSIS — L89152 Pressure ulcer of sacral region, stage 2: Secondary | ICD-10-CM | POA: Diagnosis not present

## 2014-06-22 DIAGNOSIS — Z79899 Other long term (current) drug therapy: Secondary | ICD-10-CM | POA: Diagnosis not present

## 2014-06-22 DIAGNOSIS — G459 Transient cerebral ischemic attack, unspecified: Secondary | ICD-10-CM | POA: Diagnosis present

## 2014-06-22 DIAGNOSIS — N400 Enlarged prostate without lower urinary tract symptoms: Secondary | ICD-10-CM | POA: Diagnosis not present

## 2014-06-22 DIAGNOSIS — A419 Sepsis, unspecified organism: Secondary | ICD-10-CM

## 2014-06-22 DIAGNOSIS — Z86718 Personal history of other venous thrombosis and embolism: Secondary | ICD-10-CM | POA: Diagnosis not present

## 2014-06-22 DIAGNOSIS — K210 Gastro-esophageal reflux disease with esophagitis: Secondary | ICD-10-CM

## 2014-06-22 DIAGNOSIS — Y929 Unspecified place or not applicable: Secondary | ICD-10-CM | POA: Insufficient documentation

## 2014-06-22 DIAGNOSIS — E43 Unspecified severe protein-calorie malnutrition: Secondary | ICD-10-CM | POA: Diagnosis present

## 2014-06-22 DIAGNOSIS — Y999 Unspecified external cause status: Secondary | ICD-10-CM | POA: Insufficient documentation

## 2014-06-22 DIAGNOSIS — R4182 Altered mental status, unspecified: Secondary | ICD-10-CM | POA: Diagnosis present

## 2014-06-22 DIAGNOSIS — R269 Unspecified abnormalities of gait and mobility: Secondary | ICD-10-CM | POA: Diagnosis not present

## 2014-06-22 DIAGNOSIS — Z87442 Personal history of urinary calculi: Secondary | ICD-10-CM | POA: Diagnosis not present

## 2014-06-22 DIAGNOSIS — D696 Thrombocytopenia, unspecified: Secondary | ICD-10-CM | POA: Diagnosis present

## 2014-06-22 DIAGNOSIS — Z7982 Long term (current) use of aspirin: Secondary | ICD-10-CM | POA: Diagnosis not present

## 2014-06-22 DIAGNOSIS — Z87891 Personal history of nicotine dependence: Secondary | ICD-10-CM | POA: Insufficient documentation

## 2014-06-22 DIAGNOSIS — I429 Cardiomyopathy, unspecified: Secondary | ICD-10-CM

## 2014-06-22 DIAGNOSIS — W19XXXA Unspecified fall, initial encounter: Secondary | ICD-10-CM | POA: Diagnosis not present

## 2014-06-22 DIAGNOSIS — I639 Cerebral infarction, unspecified: Principal | ICD-10-CM | POA: Insufficient documentation

## 2014-06-22 DIAGNOSIS — W1839XA Other fall on same level, initial encounter: Secondary | ICD-10-CM | POA: Diagnosis not present

## 2014-06-22 DIAGNOSIS — R600 Localized edema: Secondary | ICD-10-CM

## 2014-06-22 DIAGNOSIS — R778 Other specified abnormalities of plasma proteins: Secondary | ICD-10-CM | POA: Diagnosis present

## 2014-06-22 DIAGNOSIS — Z862 Personal history of diseases of the blood and blood-forming organs and certain disorders involving the immune mechanism: Secondary | ICD-10-CM | POA: Diagnosis not present

## 2014-06-22 DIAGNOSIS — Y939 Activity, unspecified: Secondary | ICD-10-CM | POA: Insufficient documentation

## 2014-06-22 DIAGNOSIS — I959 Hypotension, unspecified: Secondary | ICD-10-CM | POA: Diagnosis present

## 2014-06-22 DIAGNOSIS — M1990 Unspecified osteoarthritis, unspecified site: Secondary | ICD-10-CM | POA: Diagnosis not present

## 2014-06-22 DIAGNOSIS — G8929 Other chronic pain: Secondary | ICD-10-CM | POA: Diagnosis not present

## 2014-06-22 DIAGNOSIS — K219 Gastro-esophageal reflux disease without esophagitis: Secondary | ICD-10-CM | POA: Diagnosis present

## 2014-06-22 DIAGNOSIS — Q211 Atrial septal defect: Secondary | ICD-10-CM | POA: Diagnosis not present

## 2014-06-22 DIAGNOSIS — R011 Cardiac murmur, unspecified: Secondary | ICD-10-CM | POA: Diagnosis not present

## 2014-06-22 DIAGNOSIS — R7989 Other specified abnormal findings of blood chemistry: Secondary | ICD-10-CM

## 2014-06-22 DIAGNOSIS — Z8619 Personal history of other infectious and parasitic diseases: Secondary | ICD-10-CM | POA: Insufficient documentation

## 2014-06-22 DIAGNOSIS — L8990 Pressure ulcer of unspecified site, unspecified stage: Secondary | ICD-10-CM | POA: Diagnosis present

## 2014-06-22 LAB — AMMONIA: Ammonia: 26 umol/L (ref 9–35)

## 2014-06-22 LAB — PROTIME-INR
INR: 1.54 — ABNORMAL HIGH (ref 0.00–1.49)
Prothrombin Time: 18.5 seconds — ABNORMAL HIGH (ref 11.6–15.2)

## 2014-06-22 LAB — ETHANOL

## 2014-06-22 LAB — I-STAT CHEM 8, ED
BUN: 18 mg/dL (ref 6–20)
CREATININE: 1 mg/dL (ref 0.61–1.24)
Calcium, Ion: 1.12 mmol/L — ABNORMAL LOW (ref 1.13–1.30)
Chloride: 102 mmol/L (ref 101–111)
GLUCOSE: 93 mg/dL (ref 65–99)
HEMATOCRIT: 27 % — AB (ref 39.0–52.0)
HEMOGLOBIN: 9.2 g/dL — AB (ref 13.0–17.0)
POTASSIUM: 4.8 mmol/L (ref 3.5–5.1)
SODIUM: 138 mmol/L (ref 135–145)
TCO2: 24 mmol/L (ref 0–100)

## 2014-06-22 LAB — I-STAT TROPONIN, ED: Troponin i, poc: 2.65 ng/mL (ref 0.00–0.08)

## 2014-06-22 LAB — COMPREHENSIVE METABOLIC PANEL
ALK PHOS: 59 U/L (ref 38–126)
ALT: 12 U/L — AB (ref 17–63)
ANION GAP: 8 (ref 5–15)
AST: 44 U/L — AB (ref 15–41)
Albumin: 2.2 g/dL — ABNORMAL LOW (ref 3.5–5.0)
BUN: 14 mg/dL (ref 6–20)
CO2: 22 mmol/L (ref 22–32)
CREATININE: 1.14 mg/dL (ref 0.61–1.24)
Calcium: 7.8 mg/dL — ABNORMAL LOW (ref 8.9–10.3)
Chloride: 105 mmol/L (ref 101–111)
GFR calc Af Amer: >60 mL/min (ref >60)
GFR calc non Af Amer: 57 mL/min — ABNORMAL LOW (ref >60)
GLUCOSE: 97 mg/dL (ref 65–99)
Potassium: 4.8 mmol/L (ref 3.5–5.1)
SODIUM: 135 mmol/L (ref 135–145)
Total Bilirubin: 1.2 mg/dL (ref 0.3–1.2)
Total Protein: 5.6 g/dL — ABNORMAL LOW (ref 6.5–8.1)

## 2014-06-22 LAB — CBC
HCT: 25.4 % — ABNORMAL LOW (ref 39.0–52.0)
HEMOGLOBIN: 8.5 g/dL — AB (ref 13.0–17.0)
MCH: 32.8 pg (ref 26.0–34.0)
MCHC: 33.5 g/dL (ref 30.0–36.0)
MCV: 98.1 fL (ref 78.0–100.0)
Platelets: 90 10*3/uL — ABNORMAL LOW (ref 150–400)
RBC: 2.59 MIL/uL — AB (ref 4.22–5.81)
RDW: 13.5 % (ref 11.5–15.5)
WBC: 7 10*3/uL (ref 4.0–10.5)

## 2014-06-22 LAB — DIFFERENTIAL
BASOS PCT: 0 % (ref 0–1)
Basophils Absolute: 0 10*3/uL (ref 0.0–0.1)
EOS ABS: 0 10*3/uL (ref 0.0–0.7)
Eosinophils Relative: 0 % (ref 0–5)
LYMPHS ABS: 0.5 10*3/uL — AB (ref 0.7–4.0)
Lymphocytes Relative: 8 % — ABNORMAL LOW (ref 12–46)
MONOS PCT: 10 % (ref 3–12)
Monocytes Absolute: 0.7 10*3/uL (ref 0.1–1.0)
NEUTROS ABS: 5.7 10*3/uL (ref 1.7–7.7)
NEUTROS PCT: 82 % — AB (ref 43–77)

## 2014-06-22 LAB — APTT: aPTT: 27 seconds (ref 24–37)

## 2014-06-22 LAB — LACTIC ACID, PLASMA: Lactic Acid, Venous: 1.4 mmol/L (ref 0.5–2.0)

## 2014-06-22 MED ORDER — STROKE: EARLY STAGES OF RECOVERY BOOK
Freq: Once | Status: AC
  Administered 2014-06-22: 1
  Filled 2014-06-22: qty 1

## 2014-06-22 MED ORDER — ASPIRIN 325 MG PO TABS
325.0000 mg | ORAL_TABLET | Freq: Every day | ORAL | Status: DC
  Filled 2014-06-22: qty 1

## 2014-06-22 MED ORDER — GABAPENTIN 100 MG PO CAPS
100.0000 mg | ORAL_CAPSULE | Freq: Two times a day (BID) | ORAL | Status: DC
  Administered 2014-06-23 – 2014-06-24 (×3): 100 mg via ORAL
  Filled 2014-06-22 (×4): qty 1

## 2014-06-22 MED ORDER — ENOXAPARIN SODIUM 40 MG/0.4ML ~~LOC~~ SOLN: 40.0000 mg | SUBCUTANEOUS | Status: DC

## 2014-06-22 MED ORDER — SODIUM CHLORIDE 0.9 % IV BOLUS (SEPSIS)
1000.0000 mL | Freq: Once | INTRAVENOUS | Status: AC
  Administered 2014-06-22: 1000 mL via INTRAVENOUS

## 2014-06-22 MED ORDER — ASPIRIN 300 MG RE SUPP
300.0000 mg | Freq: Every day | RECTAL | Status: DC
  Administered 2014-06-23: 300 mg via RECTAL
  Filled 2014-06-22: qty 1

## 2014-06-22 MED ORDER — PANTOPRAZOLE SODIUM 40 MG PO TBEC
40.0000 mg | DELAYED_RELEASE_TABLET | Freq: Every day | ORAL | Status: DC
  Administered 2014-06-23 – 2014-06-24 (×2): 40 mg via ORAL
  Filled 2014-06-22 (×2): qty 1

## 2014-06-22 MED ORDER — ASPIRIN EC 325 MG PO TBEC: 325.0000 mg | DELAYED_RELEASE_TABLET | Freq: Every day | ORAL | Status: DC

## 2014-06-22 NOTE — ED Notes (Addendum)
Pt Son wants to be listed as emergency contact: Samaj Doehring - [phone] please call w/ bed placement

## 2014-06-22 NOTE — ED Notes (Signed)
Dr.Patal at bedside

## 2014-06-22 NOTE — ED Notes (Addendum)
Dr.Patel aware of pts afib; also reported i-stat troponin results to MD. Per Dr.Patel, pt to be placed on different unit. Updated Monica on 4N of plan of care

## 2014-06-22 NOTE — Consult Note (Addendum)
Admission H&P    Chief Complaint: Recurrent episodes of slurred speech and left-sided weakness.  HPI: Brandon Hardy is an 81 y.o. male with a history of thrombocytopenia, GI bleed, stroke in August 2014 and PFO, presenting with recurrent episodes of slurred speech and left-sided weakness over the past 2 days. His son noticed slurred speech dragging of his right leg yesterday and wanted to bring patient to the ER but patient was not agreeable. He developed recurrent slurred speech as well as left facial droop and left sided weakness again sometime earlier today. Onset is unclear. By the time he arrived in the emergency room his speech had improved as had left-sided weakness. He was back to baseline at the time of this evaluation. NIH stroke score was 1, as patient had some difficulty remembering the current month. His been taking aspirin 81 mg per day up until about 3 weeks ago when he stopped it, in anticipation of the dental procedure. CT scan of his head showed old right MCA territory stroke as well as as bilateral small vessel white matter ischemic changes. There were no acute findings.  LSN: 06/22/2014, early morning tPA Given: No: Rapidly improved deficits mRankin:  Past Medical History  Diagnosis Date  . Arthritis   . Blood transfusion   . Renal disorder   . Thrombocytopenia   . Heart murmur   . Chronic back pain   . Peripheral edema     was on Lasix but has been off for about 3months  . GERD (gastroesophageal reflux disease)     takes Prilosec daily  . GI bleed     hx of   . Constipation     takes Senna daily and eats Metamucil daily  . Urinary frequency   . History of kidney stones   . History of blood transfusion     no abnormal reaction noted  . Cataracts, bilateral     immature  . History of blood clots     left leg  . History of Clostridium difficile infection   . BPH (benign prostatic hyperplasia)   . Stroke 08/22/2012  . Esophageal varices with bleeding approx 20  years ago    per pt history  . Thrombocytopenia, unspecified 08/28/2012  . PFO (patent foramen ovale) 08/28/2012    Per TEE 08/2012    Past Surgical History  Procedure Laterality Date  . Fracture surgery    . Back surgery      multiple  . Neck surgery  2013    x 2/plates, screws, decreased mobility  . Left finger surgery    . Foot surgery    . Colonoscopy    . Esophagogastroduodenoscopy    . Gi bleed    . Ulnar nerve transposition  01/09/2012    Procedure: ULNAR NERVE DECOMPRESSION/TRANSPOSITION;  Surgeon: Kathryne Hitch, MD;  Location: [facility] OR;  Service: Orthopedics;  Laterality: Right;  Right ulnar nerve decompression at the elbow/cubital tunnel and transposition  . Inguinal hernia repair Right 04/23/2012    Procedure: HERNIA REPAIR INGUINAL ADULT;  Surgeon: Kandis Cocking, MD;  Location: [facility] ORS;  Service: General;  Laterality: Right;  . Insertion of mesh Right 04/23/2012    Procedure: INSERTION OF MESH;  Surgeon: Kandis Cocking, MD;  Location: [facility] ORS;  Service: General;  Laterality: Right;  . Tee without cardioversion N/A 08/26/2012    Procedure: TRANSESOPHAGEAL ECHOCARDIOGRAM (TEE);  Surgeon: Pricilla Riffle, MD;  Location: [facility] ENDOSCOPY;  Service: Cardiovascular;  Laterality: N/A;  History reviewed. No pertinent family history. Social History:  reports that he quit smoking about 41 years ago. His smoking use included Cigarettes. He has never used smokeless tobacco. He reports that he does not drink alcohol or use illicit drugs.  Allergies:  Allergies  Allergen Reactions  . Chlorhexidine Rash    Medications: Patient's preadmission medications were reviewed by me.  ROS: History obtained from patient and patient's son.  General ROS: negative for - chills, fatigue, fever, night sweats, weight gain or weight loss Psychological ROS: negative for - behavioral disorder, hallucinations, memory difficulties, mood swings or suicidal ideation Ophthalmic ROS: negative for - blurry  vision, double vision, eye pain or loss of vision ENT ROS: negative for - epistaxis, nasal discharge, oral lesions, sore throat, tinnitus or vertigo Allergy and Immunology ROS: negative for - hives or itchy/watery eyes Hematological and Lymphatic ROS: negative for - bleeding problems, bruising or swollen lymph nodes Endocrine ROS: negative for - galactorrhea, hair pattern changes, polydipsia/polyuria or temperature intolerance Respiratory ROS: negative for - cough, hemoptysis, shortness of breath or wheezing Cardiovascular ROS: negative for - chest pain, dyspnea on exertion, edema or irregular heartbeat Gastrointestinal ROS: negative for - abdominal pain, diarrhea, hematemesis, nausea/vomiting or stool incontinence Genito-Urinary ROS: negative for - dysuria, hematuria, incontinence or urinary frequency/urgency Musculoskeletal ROS: negative for - joint swelling or muscular weakness Neurological ROS: as noted in HPI Dermatological ROS: negative for rash and skin lesion changes  Physical Examination: Blood pressure 104/40, pulse 68, temperature 99.2 F (37.3 C), temperature source Oral, resp. rate 15, height 5\' 6"  (1.676 m), SpO2 98 %.  HEENT-  Normocephalic, no lesions, without obvious abnormality.  Normal external eye and conjunctiva.  Normal TM's bilaterally.  Normal auditory canals and external ears. Normal external nose, mucus membranes and septum.  Normal pharynx. Neck supple with no masses, nodes, nodules or enlargement. Cardiovascular - regular rate and rhythm and systolic murmur: early systolic 3/6, crescendo at 2nd right intercostal space Lungs - chest clear, no wheezing, rales, normal symmetric air entry, Heart exam - S1, S2 normal, no murmur, no gallop, rate regular Abdomen - soft, non-tender; bowel sounds normal; no masses,  no organomegaly Extremities - moderate peripheral edema bilaterally  Neurologic Examination: Mental Status: Alert, oriented well except for slight difficulty  with current month; no acute distress.  Speech fluent without evidence of aphasia. Able to follow commands without difficulty. Cranial Nerves: II-Visual fields were normal. III/IV/VI-Pupils were equal and reacted. Extraocular movements were full and conjugate.    V/VII-no facial numbness and no facial weakness. VIII-normal. X-normal speech and symmetrical palatal movement. XI: trapezius strength/neck flexion strength normal bilaterally XII-midline tongue extension with normal strength. Motor: 5/5 bilaterally with normal tone and bulk Sensory: Normal throughout. Deep Tendon Reflexes: 1+ and symmetric. Plantars: Flexor bilaterally Cerebellar: Normal finger-to-nose testing. Carotid auscultation: Radiation of systolic murmur bilaterally, right greater than left.  Results for orders placed or performed during the hospital encounter of 06/22/14 (from the past 48 hour(s))  CBC     Status: Abnormal   Collection Time: 06/22/14  9:14 PM  Result Value Ref Range   WBC 7.0 4.0 - 10.5 K/uL   RBC 2.59 (L) 4.22 - 5.81 MIL/uL   Hemoglobin 8.5 (L) 13.0 - 17.0 g/dL   HCT 76.7 (L) 34.1 - 93.7 %   MCV 98.1 78.0 - 100.0 fL   MCH 32.8 26.0 - 34.0 pg   MCHC 33.5 30.0 - 36.0 g/dL   RDW 90.2 40.9 - 73.5 %  Platelets 90 (L) 150 - 400 K/uL    Comment: SPECIMEN CHECKED FOR CLOTS REPEATED TO VERIFY PLATELET COUNT CONFIRMED BY SMEAR   Differential     Status: Abnormal   Collection Time: 06/22/14  9:14 PM  Result Value Ref Range   Neutrophils Relative % 82 (H) 43 - 77 %   Neutro Abs 5.7 1.7 - 7.7 K/uL   Lymphocytes Relative 8 (L) 12 - 46 %   Lymphs Abs 0.5 (L) 0.7 - 4.0 K/uL   Monocytes Relative 10 3 - 12 %   Monocytes Absolute 0.7 0.1 - 1.0 K/uL   Eosinophils Relative 0 0 - 5 %   Eosinophils Absolute 0.0 0.0 - 0.7 K/uL   Basophils Relative 0 0 - 1 %   Basophils Absolute 0.0 0.0 - 0.1 K/uL  I-Stat Chem 8, ED  (not at [facility])     Status: Abnormal   Collection Time: 06/22/14  9:22 PM  Result Value  Ref Range   Sodium 138 135 - 145 mmol/L   Potassium 4.8 3.5 - 5.1 mmol/L   Chloride 102 101 - 111 mmol/L   BUN 18 6 - 20 mg/dL   Creatinine, Ser 0.45 0.61 - 1.24 mg/dL   Glucose, Bld 93 65 - 99 mg/dL   Calcium, Ion 4.09 (L) 1.13 - 1.30 mmol/L   TCO2 24 0 - 100 mmol/L   Hemoglobin 9.2 (L) 13.0 - 17.0 g/dL   HCT 81.1 (L) 91.4 - 78.2 %   Dg Chest 1 View  06/22/2014   CLINICAL DATA:  Fall from chair with chest pain and pelvic pain, initial encounter  EXAM: CHEST  1 VIEW  COMPARISON:  06/09/2013  FINDINGS: Cardiac shadow is mildly enlarged but stable. The lungs are well aerated bilaterally. Stable interstitial changes are noted bilaterally. No focal infiltrate or sizable effusion is seen. No acute bony abnormality is noted.  IMPRESSION: No acute abnormality noted.   Electronically Signed   By: Alcide Clever M.D.   On: 06/22/2014 20:52   Dg Pelvis 1-2 Views  06/22/2014   CLINICAL DATA:  Fall from chair with pelvic pain  EXAM: PELVIS - 1-2 VIEW  COMPARISON:  07/30/2011  FINDINGS: Diffuse osteopenia is again identified. Postsurgical changes are noted in the lower lumbar spine. Degenerative changes of the hip joints are noted bilaterally. No acute fracture or dislocation is seen.  IMPRESSION: Diffuse osteopenia.  No acute bony abnormality is noted.   Electronically Signed   By: Alcide Clever M.D.   On: 06/22/2014 21:00   Ct Head Wo Contrast  06/22/2014   CLINICAL DATA:  Slid from chair, slurred speech  EXAM: CT HEAD WITHOUT CONTRAST  CT CERVICAL SPINE WITHOUT CONTRAST  TECHNIQUE: Multidetector CT imaging of the head and cervical spine was performed following the standard protocol without intravenous contrast. Multiplanar CT image reconstructions of the cervical spine were also generated.  COMPARISON:  08/23/2012  FINDINGS: CT HEAD FINDINGS  Bony calvarium is intact. No gross soft tissue abnormality is noted. Mild atrophic changes and chronic white matter ischemic change is seen. Changes of prior right middle  cerebral artery infarct are noted. These are new from the prior examination. No acute hemorrhage, acute infarction or space-occupying mass lesion is identified.  CT CERVICAL SPINE FINDINGS  Seven cervical segments are well visualized. Interbody fusion is noted from C3-C7 with anterior fixation. Posterior laminectomy is noted at C5. No acute fracture or acute facet abnormality is noted. Multilevel osteophytic changes and facet hypertrophic changes are  noted.  IMPRESSION: CT of the head: Chronic white matter ischemic change and atrophic change. Prior right middle cerebral artery infarct is noted  CT of the cervical spine: Postsurgical changes from C3-C7. Multilevel degenerative changes are noted. No acute abnormality is seen.   Electronically Signed   By: Alcide Clever M.D.   On: 06/22/2014 21:23   Ct Cervical Spine Wo Contrast  06/22/2014   CLINICAL DATA:  Slid from chair, slurred speech  EXAM: CT HEAD WITHOUT CONTRAST  CT CERVICAL SPINE WITHOUT CONTRAST  TECHNIQUE: Multidetector CT imaging of the head and cervical spine was performed following the standard protocol without intravenous contrast. Multiplanar CT image reconstructions of the cervical spine were also generated.  COMPARISON:  08/23/2012  FINDINGS: CT HEAD FINDINGS  Bony calvarium is intact. No gross soft tissue abnormality is noted. Mild atrophic changes and chronic white matter ischemic change is seen. Changes of prior right middle cerebral artery infarct are noted. These are new from the prior examination. No acute hemorrhage, acute infarction or space-occupying mass lesion is identified.  CT CERVICAL SPINE FINDINGS  Seven cervical segments are well visualized. Interbody fusion is noted from C3-C7 with anterior fixation. Posterior laminectomy is noted at C5. No acute fracture or acute facet abnormality is noted. Multilevel osteophytic changes and facet hypertrophic changes are noted.  IMPRESSION: CT of the head: Chronic white matter ischemic change  and atrophic change. Prior right middle cerebral artery infarct is noted  CT of the cervical spine: Postsurgical changes from C3-C7. Multilevel degenerative changes are noted. No acute abnormality is seen.   Electronically Signed   By: Alcide Clever M.D.   On: 06/22/2014 21:23    Assessment: 81 y.o. male with a history of PFO and previous stroke 2 years ago, presenting with recurrent TIAs, likely involving right subcortical MCA territory.  Stroke Risk Factors - PFO  Plan: 1. HgbA1c, fasting lipid panel 2. MRI, MRA  of the brain without contrast 3. PT consult, OT consult, Speech consult 4. Echocardiogram or possible repeat TEE 5. Carotid dopplers 6. Prophylactic therapy-aspirin 7. Risk factor modification 8. Telemetry monitoring  C.R. Roseanne Reno, MD [facility] [phone]  06/22/2014, 9:43 PM

## 2014-06-22 NOTE — ED Notes (Signed)
Attempted to give report to floor 

## 2014-06-22 NOTE — ED Notes (Signed)
Pt reports pain to left hip, provider informed.

## 2014-06-22 NOTE — H&P (Addendum)
[facility] History and Physical  Patient: Brandon Hardy  MRN: [id number]  DOB: Apr 15, 1927  DOS: the patient was seen and examined on 06/22/2014 PCP: Gaye Alken, MD  Referring physician: Dr. Silverio Lay Chief Complaint: Generalized weakness  HPI: Brandon Hardy is a 81 y.o. male with Past medical history of chronic thrombocytopenia, chronic leg swelling, GERD, PFO, history of GI bleed, history of alcohol abuse, documented history of esophageal varices, multiple back surgery with chronic leg weakness. The patient presents with complaints of slurred speech and generalized weakness. Patient mentions that he has diarrhea since last 3 weeks and he has been taking scheduled Imodium twice a day since then. He denies having more than 2 bowel movements in a day. He denies any blood in his bowel. Last night when patient's son was at his home he felt that the patient was confused and was not answering simple questions appropriately and was not acting himself. Patient declined to go to the ER for evaluation at that point. This morning when son revisited the patient he was at his baseline. Later on in the evening son found that the patient was having some slurred speech again was having significant weakness throughout the day with 2 fall in which he would not be able to stand up on his own and therefore they brought him to the hospital. There was no vomiting reported. The patient denies any complaints of chest pain and abdominal pain headache any focal weakness. Patient also denies any cough or any choking episode. Patient mentions he has been compliant with all his medications. Patient has recently noted he has started developing leg swelling again and has been taking his Lasix regularly. He denies any blood or bleeding anywhere. He denies any cough fever or chills. As per family the patient has lost 40 pounds and as per documentation he has lost 24 pounds in the last 2 years.  The  patient is coming from home And at his baseline independent for most of his ADL. At his baseline the patient ambulates with cane and a walker and has difficulty standing up on his own after a fall.  Review of Systems: as mentioned in the history of present illness.  A comprehensive review of the other systems is negative.  Past Medical History  Diagnosis Date  . Arthritis   . Blood transfusion   . Renal disorder   . Thrombocytopenia   . Heart murmur   . Chronic back pain   . Peripheral edema     was on Lasix but has been off for about 3months  . GERD (gastroesophageal reflux disease)     takes Prilosec daily  . GI bleed     hx of   . Constipation     takes Senna daily and eats Metamucil daily  . Urinary frequency   . History of kidney stones   . History of blood transfusion     no abnormal reaction noted  . Cataracts, bilateral     immature  . History of blood clots     left leg  . History of Clostridium difficile infection   . BPH (benign prostatic hyperplasia)   . Stroke 08/22/2012  . Esophageal varices with bleeding approx 20 years ago    per pt history  . Thrombocytopenia, unspecified 08/28/2012  . PFO (patent foramen ovale) 08/28/2012    Per TEE 08/2012   Past Surgical History  Procedure Laterality Date  . Fracture surgery    . Back surgery  multiple  . Neck surgery  2013    x 2/plates, screws, decreased mobility  . Left finger surgery    . Foot surgery    . Colonoscopy    . Esophagogastroduodenoscopy    . Gi bleed    . Ulnar nerve transposition  01/09/2012    Procedure: ULNAR NERVE DECOMPRESSION/TRANSPOSITION;  Surgeon: Kathryne Hitch, MD;  Location: [facility] OR;  Service: Orthopedics;  Laterality: Right;  Right ulnar nerve decompression at the elbow/cubital tunnel and transposition  . Inguinal hernia repair Right 04/23/2012    Procedure: HERNIA REPAIR INGUINAL ADULT;  Surgeon: Kandis Cocking, MD;  Location: [facility] ORS;  Service: General;  Laterality: Right;    . Insertion of mesh Right 04/23/2012    Procedure: INSERTION OF MESH;  Surgeon: Kandis Cocking, MD;  Location: [facility] ORS;  Service: General;  Laterality: Right;  . Tee without cardioversion N/A 08/26/2012    Procedure: TRANSESOPHAGEAL ECHOCARDIOGRAM (TEE);  Surgeon: Pricilla Riffle, MD;  Location: [facility] ENDOSCOPY;  Service: Cardiovascular;  Laterality: N/A;   Social History:  reports that he quit smoking about 41 years ago. His smoking use included Cigarettes. He has never used smokeless tobacco. He reports that he does not drink alcohol or use illicit drugs.  Allergies  Allergen Reactions  . Chlorhexidine Rash    History reviewed. No pertinent family history.  Prior to Admission medications   Medication Sig Start Date End Date Taking? Authorizing Provider  aspirin EC 81 MG EC tablet Take 1 tablet (81 mg total) by mouth daily. 09/05/12  Yes Evlyn Kanner Love, PA-C  furosemide (LASIX) 20 MG tablet Take 20 mg by mouth as needed for fluid.    Yes Historical Provider, MD  gabapentin (NEURONTIN) 100 MG capsule Take 100 mg by mouth 2 (two) times daily.    Yes Historical Provider, MD  omeprazole (PRILOSEC) 20 MG capsule Take 20 mg by mouth daily.   Yes Historical Provider, MD  riboflavin (VITAMIN B-2) 100 MG TABS tablet Take 100 mg by mouth daily.   Yes Historical Provider, MD  senna-docusate (SENOKOT-S) 8.6-50 MG per tablet Take 1 tablet by mouth 2 (two) times daily. 09/05/12  Yes Evlyn Kanner Love, PA-C  tamsulosin (FLOMAX) 0.4 MG CAPS capsule Take 0.4 mg by mouth daily.   Yes Historical Provider, MD    Physical Exam: Filed Vitals:   06/22/14 1956 06/22/14 2000 06/22/14 2130  BP: 98/42 101/43 104/40  Pulse: 79  68  Temp: 99.2 F (37.3 C)    TempSrc: Oral    Resp: 15 15   Height: 5\' 6"  (1.676 m)    SpO2: 96%  98%    General: Alert, Awake and Oriented to Time, Place and Person. Appear in moderate distress Eyes: PERRL ENT: Oral Mucosa clear dry. Neck: Difficult to assess JVD Cardiovascular: S1 and S2  Present, aortic systolic Murmur, Peripheral Pulses Present Respiratory: Bilateral Air entry equal and Decreased,  Clear to Auscultation, no Crackles, no wheezes Abdomen: Bowel Sound present, Soft and non tender Skin: no Rash Extremities: Bilateral Pedal edema, no calf tenderness Neurologic: Left facial droop  Labs on Admission:  CBC:  Recent Labs Lab 06/22/14 2114 06/22/14 2122  WBC 7.0  --   NEUTROABS 5.7  --   HGB 8.5* 9.2*  HCT 25.4* 27.0*  MCV 98.1  --   PLT 90*  --     CMP     Component Value Date/Time   NA 138 06/22/2014 2122   K 4.8 06/22/2014 2122  CL 102 06/22/2014 2122   CO2 22 06/22/2014 2114   GLUCOSE 93 06/22/2014 2122   BUN 18 06/22/2014 2122   CREATININE 1.00 06/22/2014 2122   CALCIUM 7.8* 06/22/2014 2114   PROT 5.6* 06/22/2014 2114   ALBUMIN 2.2* 06/22/2014 2114   AST 44* 06/22/2014 2114   ALT 12* 06/22/2014 2114   ALKPHOS 59 06/22/2014 2114   BILITOT 1.2 06/22/2014 2114   GFRNONAA 57* 06/22/2014 2114   GFRAA >60 06/22/2014 2114    No results for input(s): LIPASE, AMYLASE in the last 168 hours.  No results for input(s): CKTOTAL, CKMB, CKMBINDEX, TROPONINI in the last 168 hours. BNP (last 3 results) No results for input(s): BNP in the last 8760 hours.  ProBNP (last 3 results) No results for input(s): PROBNP in the last 8760 hours.   Radiological Exams on Admission: Dg Chest 1 View  06/22/2014   CLINICAL DATA:  Fall from chair with chest pain and pelvic pain, initial encounter  EXAM: CHEST  1 VIEW  COMPARISON:  06/09/2013  FINDINGS: Cardiac shadow is mildly enlarged but stable. The lungs are well aerated bilaterally. Stable interstitial changes are noted bilaterally. No focal infiltrate or sizable effusion is seen. No acute bony abnormality is noted.  IMPRESSION: No acute abnormality noted.   Electronically Signed   By: Alcide Clever M.D.   On: 06/22/2014 20:52   Dg Pelvis 1-2 Views  06/22/2014   CLINICAL DATA:  Fall from chair with pelvic pain   EXAM: PELVIS - 1-2 VIEW  COMPARISON:  07/30/2011  FINDINGS: Diffuse osteopenia is again identified. Postsurgical changes are noted in the lower lumbar spine. Degenerative changes of the hip joints are noted bilaterally. No acute fracture or dislocation is seen.  IMPRESSION: Diffuse osteopenia.  No acute bony abnormality is noted.   Electronically Signed   By: Alcide Clever M.D.   On: 06/22/2014 21:00   Ct Head Wo Contrast  06/22/2014   CLINICAL DATA:  Slid from chair, slurred speech  EXAM: CT HEAD WITHOUT CONTRAST  CT CERVICAL SPINE WITHOUT CONTRAST  TECHNIQUE: Multidetector CT imaging of the head and cervical spine was performed following the standard protocol without intravenous contrast. Multiplanar CT image reconstructions of the cervical spine were also generated.  COMPARISON:  08/23/2012  FINDINGS: CT HEAD FINDINGS  Bony calvarium is intact. No gross soft tissue abnormality is noted. Mild atrophic changes and chronic white matter ischemic change is seen. Changes of prior right middle cerebral artery infarct are noted. These are new from the prior examination. No acute hemorrhage, acute infarction or space-occupying mass lesion is identified.  CT CERVICAL SPINE FINDINGS  Seven cervical segments are well visualized. Interbody fusion is noted from C3-C7 with anterior fixation. Posterior laminectomy is noted at C5. No acute fracture or acute facet abnormality is noted. Multilevel osteophytic changes and facet hypertrophic changes are noted.  IMPRESSION: CT of the head: Chronic white matter ischemic change and atrophic change. Prior right middle cerebral artery infarct is noted  CT of the cervical spine: Postsurgical changes from C3-C7. Multilevel degenerative changes are noted. No acute abnormality is seen.   Electronically Signed   By: Alcide Clever M.D.   On: 06/22/2014 21:23   Ct Cervical Spine Wo Contrast  06/22/2014   CLINICAL DATA:  Slid from chair, slurred speech  EXAM: CT HEAD WITHOUT CONTRAST  CT  CERVICAL SPINE WITHOUT CONTRAST  TECHNIQUE: Multidetector CT imaging of the head and cervical spine was performed following the standard protocol without intravenous contrast. Multiplanar  CT image reconstructions of the cervical spine were also generated.  COMPARISON:  08/23/2012  FINDINGS: CT HEAD FINDINGS  Bony calvarium is intact. No gross soft tissue abnormality is noted. Mild atrophic changes and chronic white matter ischemic change is seen. Changes of prior right middle cerebral artery infarct are noted. These are new from the prior examination. No acute hemorrhage, acute infarction or space-occupying mass lesion is identified.  CT CERVICAL SPINE FINDINGS  Seven cervical segments are well visualized. Interbody fusion is noted from C3-C7 with anterior fixation. Posterior laminectomy is noted at C5. No acute fracture or acute facet abnormality is noted. Multilevel osteophytic changes and facet hypertrophic changes are noted.  IMPRESSION: CT of the head: Chronic white matter ischemic change and atrophic change. Prior right middle cerebral artery infarct is noted  CT of the cervical spine: Postsurgical changes from C3-C7. Multilevel degenerative changes are noted. No acute abnormality is seen.   Electronically Signed   By: Alcide Clever M.D.   On: 06/22/2014 21:23   EKG: Independently reviewed. atrial fibrillation, rate controlled.  Assessment/Plan Principal Problem:   CVA (cerebral infarction) Active Problems:   Gait disturbance   Fall   GERD (gastroesophageal reflux disease)   Leg edema   Thrombocytopenia   PFO (patent foramen ovale)   Pressure ulcer   Hypotension   Elevated troponin   1. CVA (cerebral infarction) The patient is presenting with complaints of slurred speech, confusion, difficulty standing up on his own. Initially there was 5 some left-sided weakness on initial examination. Patient continues to have some left-sided facial droop. Neurology has evaluated the patient and  recommend further stroke workup. We will continue with MRI/MRA brain echocardiogram carotid Doppler in the morning. Monitor on telemetry. Stroke swallowing test PT OT as well as speech evaluation and aspirin.  2. Elevated troponin. Patient is also found to have significantly elevated troponin. At present the etiology is unclear but probably type II ordered to [facility] related ischemia is more likely. EKG has atrial fibrillation and the patient does not have any complaints of chest pain. With this I have consulted with cardiology who will be following up with the patient and we will follow their recommendation. Echogram in the morning.  3. Hypotension. The patient appears significantly dehydrated with found findings of hypotension. I will give him IV fluids and monitor in stepdown unit until his blood pressure improves. Check lactic acid levels as well as workup related to infection. If patient develops fever will also get blood cultures. At present patient denies having any source of infection.  4. Chronic thrombocytopenia, history of alcohol abuse, elevated INR. The patient has history of chronic leukocytopenia most likely secondary to liver disease. His INR also appearing to be elevated.  Possibly combination of poor oral intake as well as possible liver disease  Recheck INR in the morning. Monitor for active bleeding.  5. chronic anemia. Checking FOBT. We will also check iron studies.  6. Diarrhea. Etiology currently unclear. Patient denies having more than 2 bowel movements ever in last 3 weeks. At present we'll hold off on any Imodium. Should he have diarrhea here will check for C. difficile.   Advance goals of care discussion: Full code as per my discussion with patient. Patient would not like to be kept alive on machines. His power of attorney with the patient's daughter-in-law   Consults: ED discussed with neurology. I discussed with cardiology  DVT Prophylaxis:  mechanical compression device  Nutrition: Nothing by mouth except medications  Family Communication:  family was present at bedside, opportunity was given to ask question and all questions were answered satisfactorily at the time of interview. Disposition: Admitted as inpatient, step-down unit.  Author: Lynden Oxford, MD [facility] Pager: [phone] 06/22/2014   Addendum: Ct chest negative for PE and shows possible pneumonia, started on community acquired coverage.    Kitrina Maurin  06/23/2014   If 7PM-7AM, please contact night-coverage [email or web address] Password [email or web address]

## 2014-06-22 NOTE — ED Notes (Addendum)
Per EMS, pt "slid out of recliner." Pts son reports that his speech was very slurred however by the time he arrived at the hospital his cognitive abilities and speech had returned to normal.  Pt's son reports an episode of confusion and speech problems occurred yesterday however resolved, unsure of timeline for today but knows that he had driven to the store and returned confused.  Per pts son, he is now at baseline.  Does have a stage 2 pressure ulcer on bottom.  Pt was slightly hypotensive upon arrival however pressure is coming up.

## 2014-06-22 NOTE — ED Notes (Signed)
Dr Stewart at bedside.

## 2014-06-22 NOTE — ED Notes (Signed)
Attempted to give report 

## 2014-06-22 NOTE — ED Provider Notes (Signed)
CSN: [id number]     Arrival date & time 06/22/14  1944 History   First MD Initiated Contact with Patient 06/22/14 1945     Chief Complaint  Patient presents with  . Altered Mental Status     (Consider location/radiation/quality/duration/timing/severity/associated sxs/prior Treatment) The history is provided by the patient and the EMS personnel.  Brandon Hardy is a 81 y.o. male hx of TIA, GI bleed, here with slurred speech, weakness. Patient was noticed to have slurred speech by his son yesterday. He refused to come to the ER yesterday. Slurred speech resolved and this afternoon he fell and was unable to get up. Son went to see him and noticed that he has slurred speech again and has more weakness on the left side. Per EMS, patient seemed to have a left facial droop with left pronator drift. Patient states that he feels fine just has some pain in this product from a pressure ulcer.    Level V caveat- condition of patient   Past Medical History  Diagnosis Date  . Arthritis   . Blood transfusion   . Renal disorder   . Thrombocytopenia   . Heart murmur   . Chronic back pain   . Peripheral edema     was on Lasix but has been off for about 3months  . GERD (gastroesophageal reflux disease)     takes Prilosec daily  . GI bleed     hx of   . Constipation     takes Senna daily and eats Metamucil daily  . Urinary frequency   . History of kidney stones   . History of blood transfusion     no abnormal reaction noted  . Cataracts, bilateral     immature  . History of blood clots     left leg  . History of Clostridium difficile infection   . BPH (benign prostatic hyperplasia)   . Stroke 08/22/2012  . Esophageal varices with bleeding approx 20 years ago    per pt history  . Thrombocytopenia, unspecified 08/28/2012  . PFO (patent foramen ovale) 08/28/2012    Per TEE 08/2012   Past Surgical History  Procedure Laterality Date  . Fracture surgery    . Back surgery      multiple  .  Neck surgery  2013    x 2/plates, screws, decreased mobility  . Left finger surgery    . Foot surgery    . Colonoscopy    . Esophagogastroduodenoscopy    . Gi bleed    . Ulnar nerve transposition  01/09/2012    Procedure: ULNAR NERVE DECOMPRESSION/TRANSPOSITION;  Surgeon: Kathryne Hitch, MD;  Location: [facility] OR;  Service: Orthopedics;  Laterality: Right;  Right ulnar nerve decompression at the elbow/cubital tunnel and transposition  . Inguinal hernia repair Right 04/23/2012    Procedure: HERNIA REPAIR INGUINAL ADULT;  Surgeon: Kandis Cocking, MD;  Location: [facility] ORS;  Service: General;  Laterality: Right;  . Insertion of mesh Right 04/23/2012    Procedure: INSERTION OF MESH;  Surgeon: Kandis Cocking, MD;  Location: [facility] ORS;  Service: General;  Laterality: Right;  . Tee without cardioversion N/A 08/26/2012    Procedure: TRANSESOPHAGEAL ECHOCARDIOGRAM (TEE);  Surgeon: Pricilla Riffle, MD;  Location: [facility] ENDOSCOPY;  Service: Cardiovascular;  Laterality: N/A;   History reviewed. No pertinent family history. History  Substance Use Topics  . Smoking status: Former Smoker    Types: Cigarettes    Quit date: 02/03/1973  . Smokeless tobacco: Never  Used  . Alcohol Use: No    Review of Systems  Skin: Positive for wound.  Neurological: Positive for speech difficulty and weakness.  All other systems reviewed and are negative.     Allergies  Chlorhexidine  Home Medications   Prior to Admission medications   Medication Sig Start Date End Date Taking? Authorizing Provider  aspirin EC 81 MG EC tablet Take 1 tablet (81 mg total) by mouth daily. 09/05/12  Yes Evlyn Kanner Love, PA-C  furosemide (LASIX) 20 MG tablet Take 20 mg by mouth as needed for fluid.    Yes Historical Provider, MD  gabapentin (NEURONTIN) 100 MG capsule Take 100 mg by mouth 2 (two) times daily.    Yes Historical Provider, MD  omeprazole (PRILOSEC) 20 MG capsule Take 20 mg by mouth daily.   Yes Historical Provider, MD  riboflavin  (VITAMIN B-2) 100 MG TABS tablet Take 100 mg by mouth daily.   Yes Historical Provider, MD  senna-docusate (SENOKOT-S) 8.6-50 MG per tablet Take 1 tablet by mouth 2 (two) times daily. 09/05/12  Yes Evlyn Kanner Love, PA-C  tamsulosin (FLOMAX) 0.4 MG CAPS capsule Take 0.4 mg by mouth daily.   Yes Historical Provider, MD   BP 104/40 mmHg  Pulse 68  Temp(Src) 99.2 F (37.3 C) (Oral)  Resp 15  Ht  (1.676 m)  SpO2 98% Physical Exam  Constitutional:  Chronically ill   HENT:  Head: Normocephalic.  Mouth/Throat: Oropharynx is clear and moist.  Eyes: Conjunctivae are normal. Pupils are equal, round, and reactive to light.  Neck: Normal range of motion. Neck supple.  Cardiovascular: Normal rate, regular rhythm and normal heart sounds.   Pulmonary/Chest: Effort normal and breath sounds normal. No respiratory distress. He has no wheezes. He has no rales.  Abdominal: Soft. Bowel sounds are normal. He exhibits no distension. There is no tenderness. There is no rebound.  Musculoskeletal: Normal range of motion.  Stage 2 sacral ulcer  Neurological: He is alert.  + L facial droop. Strength 4/5 L side, 5/5 R side. Mild L facial droop. Unable to ambulate   Skin: Skin is warm and dry.  Psychiatric:  unable   Nursing note and vitals reviewed.   ED Course  Procedures (including critical care time) Labs Review Labs Reviewed  CBC - Abnormal; Notable for the following:    RBC 2.59 (*)    Hemoglobin 8.5 (*)    HCT 25.4 (*)    Platelets 90 (*)    All other components within normal limits  DIFFERENTIAL - Abnormal; Notable for the following:    Neutrophils Relative % 82 (*)    Lymphocytes Relative 8 (*)    Lymphs Abs 0.5 (*)    All other components within normal limits  I-STAT CHEM 8, ED - Abnormal; Notable for the following:    Calcium, Ion 1.12 (*)    Hemoglobin 9.2 (*)    HCT 27.0 (*)    All other components within normal limits  ETHANOL  PROTIME-INR  APTT  COMPREHENSIVE METABOLIC PANEL   URINE RAPID DRUG SCREEN, HOSP PERFORMED  URINALYSIS, ROUTINE W REFLEX MICROSCOPIC (NOT AT [facility])  Rosezena Sensor, ED    Imaging Review Dg Chest 1 View  06/22/2014   CLINICAL DATA:  Fall from chair with chest pain and pelvic pain, initial encounter  EXAM: CHEST  1 VIEW  COMPARISON:  06/09/2013  FINDINGS: Cardiac shadow is mildly enlarged but stable. The lungs are well aerated bilaterally. Stable interstitial changes are noted  bilaterally. No focal infiltrate or sizable effusion is seen. No acute bony abnormality is noted.  IMPRESSION: No acute abnormality noted.   Electronically Signed   By: Alcide Clever M.D.   On: 06/22/2014 20:52   Dg Pelvis 1-2 Views  06/22/2014   CLINICAL DATA:  Fall from chair with pelvic pain  EXAM: PELVIS - 1-2 VIEW  COMPARISON:  07/30/2011  FINDINGS: Diffuse osteopenia is again identified. Postsurgical changes are noted in the lower lumbar spine. Degenerative changes of the hip joints are noted bilaterally. No acute fracture or dislocation is seen.  IMPRESSION: Diffuse osteopenia.  No acute bony abnormality is noted.   Electronically Signed   By: Alcide Clever M.D.   On: 06/22/2014 21:00   Ct Head Wo Contrast  06/22/2014   CLINICAL DATA:  Slid from chair, slurred speech  EXAM: CT HEAD WITHOUT CONTRAST  CT CERVICAL SPINE WITHOUT CONTRAST  TECHNIQUE: Multidetector CT imaging of the head and cervical spine was performed following the standard protocol without intravenous contrast. Multiplanar CT image reconstructions of the cervical spine were also generated.  COMPARISON:  08/23/2012  FINDINGS: CT HEAD FINDINGS  Bony calvarium is intact. No gross soft tissue abnormality is noted. Mild atrophic changes and chronic white matter ischemic change is seen. Changes of prior right middle cerebral artery infarct are noted. These are new from the prior examination. No acute hemorrhage, acute infarction or space-occupying mass lesion is identified.  CT CERVICAL SPINE FINDINGS  Seven cervical  segments are well visualized. Interbody fusion is noted from C3-C7 with anterior fixation. Posterior laminectomy is noted at C5. No acute fracture or acute facet abnormality is noted. Multilevel osteophytic changes and facet hypertrophic changes are noted.  IMPRESSION: CT of the head: Chronic white matter ischemic change and atrophic change. Prior right middle cerebral artery infarct is noted  CT of the cervical spine: Postsurgical changes from C3-C7. Multilevel degenerative changes are noted. No acute abnormality is seen.   Electronically Signed   By: Alcide Clever M.D.   On: 06/22/2014 21:23   Ct Cervical Spine Wo Contrast  06/22/2014   CLINICAL DATA:  Slid from chair, slurred speech  EXAM: CT HEAD WITHOUT CONTRAST  CT CERVICAL SPINE WITHOUT CONTRAST  TECHNIQUE: Multidetector CT imaging of the head and cervical spine was performed following the standard protocol without intravenous contrast. Multiplanar CT image reconstructions of the cervical spine were also generated.  COMPARISON:  08/23/2012  FINDINGS: CT HEAD FINDINGS  Bony calvarium is intact. No gross soft tissue abnormality is noted. Mild atrophic changes and chronic white matter ischemic change is seen. Changes of prior right middle cerebral artery infarct are noted. These are new from the prior examination. No acute hemorrhage, acute infarction or space-occupying mass lesion is identified.  CT CERVICAL SPINE FINDINGS  Seven cervical segments are well visualized. Interbody fusion is noted from C3-C7 with anterior fixation. Posterior laminectomy is noted at C5. No acute fracture or acute facet abnormality is noted. Multilevel osteophytic changes and facet hypertrophic changes are noted.  IMPRESSION: CT of the head: Chronic white matter ischemic change and atrophic change. Prior right middle cerebral artery infarct is noted  CT of the cervical spine: Postsurgical changes from C3-C7. Multilevel degenerative changes are noted. No acute abnormality is seen.    Electronically Signed   By: Alcide Clever M.D.   On: 06/22/2014 21:23     EKG Interpretation   Date/Time:  Monday June 22 2014 21:37:38 EDT Ventricular Rate:  62 PR Interval:  172  QRS Duration: 94 QT Interval:  504 QTC Calculation: 512 R Axis:   48 Text Interpretation:  Atrial fibrillation Probable inferior infarct, old  Prolonged QT interval No significant change since last tracing Confirmed  by Maximo Spratling  MD, Georjean Toya ([id number]) on 06/22/2014 9:40:31 PM      MDM   Final diagnoses:  Fall    WACLAW PENE is a 81 y.o. male  Here with stroke symptoms. Outside window for TPA. Will consult neurology and likely admit for stroke workup.  9:49 PM Dr. Roseanne Reno saw patient. Recommend medicine admission. Hg 8.5, baseline around 9. Denies melena.     Richardean Canal, MD 06/22/14 2150

## 2014-06-23 ENCOUNTER — Encounter (HOSPITAL_COMMUNITY): Payer: PPO

## 2014-06-23 ENCOUNTER — Inpatient Hospital Stay (HOSPITAL_COMMUNITY): Payer: PPO

## 2014-06-23 ENCOUNTER — Encounter (HOSPITAL_COMMUNITY): Payer: Self-pay | Admitting: Radiology

## 2014-06-23 DIAGNOSIS — J189 Pneumonia, unspecified organism: Secondary | ICD-10-CM

## 2014-06-23 DIAGNOSIS — R7989 Other specified abnormal findings of blood chemistry: Secondary | ICD-10-CM

## 2014-06-23 DIAGNOSIS — I509 Heart failure, unspecified: Secondary | ICD-10-CM | POA: Diagnosis not present

## 2014-06-23 DIAGNOSIS — I639 Cerebral infarction, unspecified: Secondary | ICD-10-CM

## 2014-06-23 DIAGNOSIS — L89152 Pressure ulcer of sacral region, stage 2: Secondary | ICD-10-CM | POA: Diagnosis not present

## 2014-06-23 DIAGNOSIS — D696 Thrombocytopenia, unspecified: Secondary | ICD-10-CM

## 2014-06-23 DIAGNOSIS — E43 Unspecified severe protein-calorie malnutrition: Secondary | ICD-10-CM | POA: Diagnosis present

## 2014-06-23 DIAGNOSIS — M1990 Unspecified osteoarthritis, unspecified site: Secondary | ICD-10-CM | POA: Diagnosis not present

## 2014-06-23 DIAGNOSIS — R011 Cardiac murmur, unspecified: Secondary | ICD-10-CM | POA: Diagnosis not present

## 2014-06-23 DIAGNOSIS — I214 Non-ST elevation (NSTEMI) myocardial infarction: Secondary | ICD-10-CM | POA: Diagnosis not present

## 2014-06-23 LAB — CBC WITH DIFFERENTIAL/PLATELET
Basophils Absolute: 0 10*3/uL (ref 0.0–0.1)
Basophils Relative: 0 % (ref 0–1)
Eosinophils Absolute: 0 10*3/uL (ref 0.0–0.7)
Eosinophils Relative: 1 % (ref 0–5)
HEMATOCRIT: 26.6 % — AB (ref 39.0–52.0)
HEMOGLOBIN: 8.8 g/dL — AB (ref 13.0–17.0)
LYMPHS ABS: 0.9 10*3/uL (ref 0.7–4.0)
Lymphocytes Relative: 12 % (ref 12–46)
MCH: 32.5 pg (ref 26.0–34.0)
MCHC: 33.1 g/dL (ref 30.0–36.0)
MCV: 98.2 fL (ref 78.0–100.0)
MONOS PCT: 9 % (ref 3–12)
Monocytes Absolute: 0.6 10*3/uL (ref 0.1–1.0)
NEUTROS PCT: 78 % — AB (ref 43–77)
Neutro Abs: 5.5 10*3/uL (ref 1.7–7.7)
Platelets: 99 10*3/uL — ABNORMAL LOW (ref 150–400)
RBC: 2.71 MIL/uL — AB (ref 4.22–5.81)
RDW: 13.5 % (ref 11.5–15.5)
WBC: 7 10*3/uL (ref 4.0–10.5)

## 2014-06-23 LAB — CK: CK TOTAL: 301 U/L (ref 49–397)

## 2014-06-23 LAB — LIPID PANEL
Cholesterol: 76 mg/dL (ref 0–200)
HDL: 26 mg/dL — AB (ref >40)
LDL Cholesterol: 42 mg/dL (ref 0–99)
TRIGLYCERIDES: 41 mg/dL (ref <150)
Total CHOL/HDL Ratio: 2.9 RATIO
VLDL: 8 mg/dL (ref 0–40)

## 2014-06-23 LAB — COMPREHENSIVE METABOLIC PANEL
ALT: 11 U/L — ABNORMAL LOW (ref 17–63)
AST: 60 U/L — AB (ref 15–41)
Albumin: 2 g/dL — ABNORMAL LOW (ref 3.5–5.0)
Alkaline Phosphatase: 58 U/L (ref 38–126)
Anion gap: 4 — ABNORMAL LOW (ref 5–15)
BUN: 13 mg/dL (ref 6–20)
CALCIUM: 7.6 mg/dL — AB (ref 8.9–10.3)
CO2: 23 mmol/L (ref 22–32)
Chloride: 109 mmol/L (ref 101–111)
Creatinine, Ser: 0.9 mg/dL (ref 0.61–1.24)
GFR calc Af Amer: >60 mL/min (ref >60)
GFR calc non Af Amer: >60 mL/min (ref >60)
Glucose, Bld: 93 mg/dL (ref 65–99)
Potassium: 3.6 mmol/L (ref 3.5–5.1)
Sodium: 136 mmol/L (ref 135–145)
Total Bilirubin: 1.2 mg/dL (ref 0.3–1.2)
Total Protein: 5.2 g/dL — ABNORMAL LOW (ref 6.5–8.1)

## 2014-06-23 LAB — URINALYSIS, ROUTINE W REFLEX MICROSCOPIC
Bilirubin Urine: NEGATIVE
Glucose, UA: NEGATIVE mg/dL
Ketones, ur: NEGATIVE mg/dL
Leukocytes, UA: NEGATIVE
Nitrite: NEGATIVE
Protein, ur: NEGATIVE mg/dL
SPECIFIC GRAVITY, URINE: 1.012 (ref 1.005–1.030)
Urobilinogen, UA: 1 mg/dL (ref 0.0–1.0)
pH: 5.5 (ref 5.0–8.0)

## 2014-06-23 LAB — GLUCOSE, CAPILLARY
GLUCOSE-CAPILLARY: 90 mg/dL (ref 65–99)
Glucose-Capillary: 92 mg/dL (ref 65–99)
Glucose-Capillary: 91 mg/dL (ref 65–99)
Glucose-Capillary: 99 mg/dL (ref 65–99)
Glucose-Capillary: 114 mg/dL — ABNORMAL HIGH (ref 65–99)

## 2014-06-23 LAB — TROPONIN I
Troponin I: 0.93 ng/mL (ref <0.031)
Troponin I: 5.62 ng/mL (ref <0.031)
Troponin I: 4.63 ng/mL (ref <0.031)
Troponin I: 3.75 ng/mL (ref <0.031)

## 2014-06-23 LAB — URINE MICROSCOPIC-ADD ON

## 2014-06-23 LAB — MRSA PCR SCREENING: MRSA by PCR: NEGATIVE

## 2014-06-23 LAB — RAPID URINE DRUG SCREEN, HOSP PERFORMED
Amphetamines: NOT DETECTED
BARBITURATES: NOT DETECTED
Benzodiazepines: NOT DETECTED
COCAINE: NOT DETECTED
Opiates: POSITIVE — AB
TETRAHYDROCANNABINOL: NOT DETECTED

## 2014-06-23 LAB — PROTIME-INR
INR: 1.62 — AB (ref 0.00–1.49)
PROTHROMBIN TIME: 19.2 s — AB (ref 11.6–15.2)

## 2014-06-23 LAB — MAGNESIUM: MAGNESIUM: 1.6 mg/dL — AB (ref 1.7–2.4)

## 2014-06-23 LAB — TSH: TSH: 1.026 u[IU]/mL (ref 0.350–4.500)

## 2014-06-23 LAB — PROCALCITONIN: Procalcitonin: 0.29 ng/mL

## 2014-06-23 LAB — D-DIMER, QUANTITATIVE: D-Dimer, Quant: >20 ug/mL-FEU — ABNORMAL HIGH (ref 0.00–0.48)

## 2014-06-23 LAB — T4, FREE: Free T4: 1.2 ng/dL — ABNORMAL HIGH (ref 0.61–1.12)

## 2014-06-23 MED ORDER — POTASSIUM CHLORIDE CRYS ER 20 MEQ PO TBCR: 40.0000 meq | EXTENDED_RELEASE_TABLET | Freq: Once | ORAL | Status: DC

## 2014-06-23 MED ORDER — ASPIRIN EC 81 MG PO TBEC
81.0000 mg | DELAYED_RELEASE_TABLET | Freq: Every day | ORAL | Status: DC
  Administered 2014-06-23 – 2014-06-24 (×2): 81 mg via ORAL
  Filled 2014-06-23 (×2): qty 1

## 2014-06-23 MED ORDER — MORPHINE SULFATE 2 MG/ML IJ SOLN
2.0000 mg | Freq: Once | INTRAMUSCULAR | Status: AC
  Administered 2014-06-23: 1 mg via INTRAVENOUS
  Filled 2014-06-23: qty 1

## 2014-06-23 MED ORDER — AZITHROMYCIN 500 MG IV SOLR
500.0000 mg | INTRAVENOUS | Status: DC
  Administered 2014-06-23 – 2014-06-24 (×2): 500 mg via INTRAVENOUS
  Filled 2014-06-23 (×2): qty 500

## 2014-06-23 MED ORDER — HYDROCODONE-ACETAMINOPHEN 5-325 MG PO TABS
2.0000 | ORAL_TABLET | Freq: Once | ORAL | Status: AC
  Administered 2014-06-24: 2 via ORAL
  Filled 2014-06-23: qty 2

## 2014-06-23 MED ORDER — HYDROCODONE-ACETAMINOPHEN 5-325 MG PO TABS: 1.0000 | ORAL_TABLET | ORAL | Status: DC | PRN

## 2014-06-23 MED ORDER — POTASSIUM CHLORIDE 10 MEQ/100ML IV SOLN
10.0000 meq | INTRAVENOUS | Status: AC
  Administered 2014-06-23 (×3): 10 meq via INTRAVENOUS
  Filled 2014-06-23 (×3): qty 100

## 2014-06-23 MED ORDER — DEXTROSE 5 % IV SOLN
500.0000 mg | Freq: Once | INTRAVENOUS | Status: DC
  Filled 2014-06-23: qty 500

## 2014-06-23 MED ORDER — CETYLPYRIDINIUM CHLORIDE 0.05 % MT LIQD
7.0000 mL | Freq: Two times a day (BID) | OROMUCOSAL | Status: DC
  Administered 2014-06-23 – 2014-06-24 (×3): 7 mL via OROMUCOSAL

## 2014-06-23 MED ORDER — CEFTRIAXONE SODIUM IN DEXTROSE 20 MG/ML IV SOLN
1.0000 g | Freq: Once | INTRAVENOUS | Status: DC
  Filled 2014-06-23: qty 50

## 2014-06-23 MED ORDER — IOHEXOL 350 MG/ML SOLN
80.0000 mL | Freq: Once | INTRAVENOUS | Status: AC | PRN
  Administered 2014-06-23: 80 mL via INTRAVENOUS

## 2014-06-23 MED ORDER — CEFTRIAXONE SODIUM IN DEXTROSE 20 MG/ML IV SOLN
1.0000 g | INTRAVENOUS | Status: DC
  Administered 2014-06-23 – 2014-06-24 (×2): 1 g via INTRAVENOUS
  Filled 2014-06-23 (×2): qty 50

## 2014-06-23 MED ORDER — ENSURE ENLIVE PO LIQD
237.0000 mL | Freq: Three times a day (TID) | ORAL | Status: DC
  Administered 2014-06-23 – 2014-06-24 (×3): 237 mL via ORAL

## 2014-06-23 NOTE — Evaluation (Signed)
Clinical/Bedside Swallow Evaluation Patient Details  Name: Brandon Hardy MRN: [id number] Date of Birth: 12/02/1927  Today's Date: 06/23/2014 Time: SLP Start Time (ACUTE ONLY): 0840 SLP Stop Time (ACUTE ONLY): 0905 SLP Time Calculation (min) (ACUTE ONLY): 25 min  Past Medical History:  Past Medical History  Diagnosis Date  . Arthritis   . Blood transfusion   . Renal disorder   . Thrombocytopenia   . Heart murmur   . Chronic back pain   . Peripheral edema     was on Lasix but has been off for about 48months  . GERD (gastroesophageal reflux disease)     takes Prilosec daily  . GI bleed     hx of   . Constipation     takes Senna daily and eats Metamucil daily  . Urinary frequency   . History of kidney stones   . History of blood transfusion     no abnormal reaction noted  . Cataracts, bilateral     immature  . History of blood clots     left leg  . History of Clostridium difficile infection   . BPH (benign prostatic hyperplasia)   . Stroke 08/22/2012  . Esophageal varices with bleeding approx 20 years ago    per pt history  . Thrombocytopenia, unspecified 08/28/2012  . PFO (patent foramen ovale) 08/28/2012    Per TEE 08/2012   Past Surgical History:  Past Surgical History  Procedure Laterality Date  . Fracture surgery    . Back surgery      multiple  . Neck surgery  2013    x 2/plates, screws, decreased mobility  . Left finger surgery    . Foot surgery    . Colonoscopy    . Esophagogastroduodenoscopy    . Gi bleed    . Ulnar nerve transposition  01/09/2012    Procedure: ULNAR NERVE DECOMPRESSION/TRANSPOSITION;  Surgeon: Kathryne Hitch, MD;  Location: [facility] OR;  Service: Orthopedics;  Laterality: Right;  Right ulnar nerve decompression at the elbow/cubital tunnel and transposition  . Inguinal hernia repair Right 04/23/2012    Procedure: HERNIA REPAIR INGUINAL ADULT;  Surgeon: Kandis Cocking, MD;  Location: [facility] ORS;  Service: General;  Laterality: Right;  .  Insertion of mesh Right 04/23/2012    Procedure: INSERTION OF MESH;  Surgeon: Kandis Cocking, MD;  Location: [facility] ORS;  Service: General;  Laterality: Right;  . Tee without cardioversion N/A 08/26/2012    Procedure: TRANSESOPHAGEAL ECHOCARDIOGRAM (TEE);  Surgeon: Pricilla Riffle, MD;  Location: [facility] ENDOSCOPY;  Service: Cardiovascular;  Laterality: N/A;   HPI:  Brandon Hardy is an 81 y.o. male with a history of thrombocytopenia, alcohol abuse, esophageal varices, GI bleed, ACDF C3-C7, stroke in August 2014 and PFO, presenting with recurrent episodes of slurred speech and left-sided weakness over the past 2 days. Neurologist reports recurrent TIAs, likely involving right subcortical MCA territory. CT/MRI negative.    Assessment / Plan / Recommendation Clinical Impression  Pt history and presentation consistent with a probable chronic dysphagia related to cervical plating, age and possible esophageal dysphagia. The pt is observed to have an audible swallow with delayed wet vocal quality and throat clearing. Pt suspected to have residuals that fall to airway post swallow. RLL consolidation also concerning for aspiration. Pt reports he eats soft foods and drinks what he likes, denies any difficulty. He also reports he underwent swallow therapy at SNF that was not helpful. Expect that he would be unlikely to be complaint  with thickened liquids even if they reduced risk of aspiration, which then may not. Will follow for basic compensatory strategies that may be helpful to reduce aspiration risk.     Aspiration Risk  Moderate    Diet Recommendation Dysphagia 3 (Mech soft);Thin   Medication Administration: Whole meds with liquid Compensations: Slow rate;Small sips/bites;Multiple dry swallows after each bite/sip;Clear throat intermittently    Other  Recommendations Oral Care Recommendations: Oral care BID   Follow Up Recommendations       Frequency and Duration min 2x/week  2 weeks   Pertinent Vitals/Pain  NA    SLP Swallow Goals     Swallow Study Prior Functional Status       General Other Pertinent Information: Brandon Hardy is an 81 y.o. male with a history of thrombocytopenia, alcohol abuse, esophageal varices, GI bleed, ACDF C3-C7, stroke in August 2014 and PFO, presenting with recurrent episodes of slurred speech and left-sided weakness over the past 2 days. Neurologist reports recurrent TIAs, likely involving right subcortical MCA territory. CT/MRI negative.  Type of Study: Bedside swallow evaluation Previous Swallow Assessment: Pt reports SLP treatment at SNF  Diet Prior to this Study: NPO Temperature Spikes Noted: No Respiratory Status: Room air History of Recent Intubation: No Behavior/Cognition: Alert;Cooperative;Pleasant mood Oral Cavity - Dentition: Edentulous (dentures) Self-Feeding Abilities: Able to feed self Patient Positioning: Upright in bed Baseline Vocal Quality: Normal Volitional Cough: Strong Volitional Swallow: Able to elicit    Oral/Motor/Sensory Function Overall Oral Motor/Sensory Function: Appears within functional limits for tasks assessed   Ice Chips     Thin Liquid Thin Liquid: Impaired Presentation: Straw;Self Fed Pharyngeal  Phase Impairments: Multiple swallows;Wet Vocal Quality;Throat Clearing - Delayed;Cough - Delayed    Nectar Thick Nectar Thick Liquid: Not tested   Honey Thick Honey Thick Liquid: Not tested   Puree Puree: Impaired Presentation: Self Fed Pharyngeal Phase Impairments: Wet Vocal Quality;Throat Clearing - Delayed   Solid   GO    Solid: Within functional limits      [facility], MA CCC-SLP [phone]  Brandon Hardy, Brandon Hardy 06/23/2014,9:17 AM

## 2014-06-23 NOTE — Progress Notes (Signed)
UR Completed Tammala Weider Graves-Bigelow, RN,BSN [phone]  

## 2014-06-23 NOTE — Evaluation (Signed)
Physical Therapy Evaluation Patient Details Name: Brandon Hardy MRN: [id number] DOB: 23-Apr-1927 Today's Date: 06/23/2014   History of Present Illness  ADm with slurred speech and Lt weakness; MRI brain negative; elevated trop I (per cardiology likely demand ischemia--not an anticoagulation candidate with h/o falls), negative for PE or DVT  PMHx- Large Rt MCA CVA, bil cerebellar infarcts, neck and back surgery    Clinical Impression  Pt admitted with above diagnosis. Negative for CVA; ?TIA. Pt near his baseline but reports difficult to ascertain due to lines/monitors limiting his mobility. Pt currently with functional limitations due to the deficits listed below (see PT Problem List).  Pt will benefit from skilled PT to increase their independence and safety with mobility to allow discharge to the venue listed below.       Follow Up Recommendations Home health PT;Supervision for mobility/OOB    Equipment Recommendations  None recommended by PT    Recommendations for Other Services OT consult     Precautions / Restrictions Precautions Precautions: Fall      Mobility  Bed Mobility Overal bed mobility: Modified Independent             General bed mobility comments: use of rail and HOB elevated  Transfers Overall transfer level: Needs assistance Equipment used: Rolling walker (2 wheeled) Transfers: Sit to/from Stand Sit to Stand: Supervision         General transfer comment: x 3; vc for sequencing and proper use of RW  Ambulation/Gait Ambulation/Gait assistance: Min guard Ambulation Distance (Feet): 20 Feet Assistive device: Rolling walker (2 wheeled) Gait Pattern/deviations: Step-to pattern;Decreased stride length;Trunk flexed   Gait velocity interpretation: Below normal speed for age/gender General Gait Details: slow but steady with RW; turns in room with tight spaces; limited by cardiac monitor/lines  Stairs            Wheelchair Mobility    Modified  Rankin (Stroke Patients Only) Modified Rankin (Stroke Patients Only) Pre-Morbid Rankin Score: Moderate disability Modified Rankin: Moderately severe disability     Balance Overall balance assessment: Needs assistance;History of Falls Sitting-balance support: No upper extremity supported;Feet supported Sitting balance-Leahy Scale: Fair     Standing balance support: No upper extremity supported Standing balance-Leahy Scale: Poor Standing balance comment: pt feels unsteady without UE support                             Pertinent Vitals/Pain HR 56-72 BP 107/57 supine; 90/55 after ambulaiton (denied dizziness)  Pain Assessment: No/denies pain    Home Living Family/patient expects to be discharged to:: Private residence Living Arrangements: Spouse/significant other Available Help at Discharge: Family;Available 24 hours/day (wife also uses cane) Type of Home: House Home Access: Stairs to enter Entrance Stairs-Rails: None Entrance Stairs-Number of Steps: 1+1 Home Layout: One level Home Equipment: Environmental consultant - 2 wheels;Cane - single point      Prior Function Level of Independence: Independent with assistive device(s)         Comments: h/o multiple falls (last 12/2013 fell off ladder hangning lights); uses cane primarily, walker outside home at times     Hand Dominance        Extremity/Trunk Assessment   Upper Extremity Assessment: Defer to OT evaluation           Lower Extremity Assessment: Generalized weakness      Cervical / Trunk Assessment: Kyphotic  Communication   Communication: HOH  Cognition Arousal/Alertness: Awake/alert Behavior During Therapy: [facility] for  tasks assessed/performed Overall Cognitive Status: No family/caregiver present to determine baseline cognitive functioning                      General Comments      Exercises        Assessment/Plan    PT Assessment Patient needs continued PT services  PT Diagnosis  Generalized weakness;Difficulty walking   PT Problem List Decreased strength;Decreased activity tolerance;Decreased balance;Decreased mobility;Decreased knowledge of use of DME;Decreased safety awareness  PT Treatment Interventions DME instruction;Gait training;Stair training;Functional mobility training;Therapeutic activities;Balance training;Cognitive remediation;Patient/family education   PT Goals (Current goals can be found in the Care Plan section) Acute Rehab PT Goals Patient Stated Goal: return home today PT Goal Formulation: With patient Time For Goal Achievement: 06/26/14 Potential to Achieve Goals: Fair    Frequency Min 3X/week   Barriers to discharge Decreased caregiver support      Co-evaluation               End of Session Equipment Utilized During Treatment: Gait belt Activity Tolerance: Patient tolerated treatment well Patient left: in chair;with call bell/phone within reach;with chair alarm set           Time: 1347-1420 PT Time Calculation (min) (ACUTE ONLY): 33 min   Charges:   PT Evaluation $Initial PT Evaluation Tier I: 1 Procedure PT Treatments $Gait Training: 8-22 mins   PT G Codes:        Norvil Martensen 07-09-14, 2:34 PM Pager [phone]

## 2014-06-23 NOTE — Progress Notes (Signed)
PT Cancellation Note  Patient Details Name: Brandon Hardy MRN: [id number] DOB: 04-18-1927   Cancelled Treatment:    Reason Eval/Treat Not Completed: Patient at procedure or test/unavailable--undergoing echo. Also noted elevated Trop I and irregular HR with ?VT earlier. Will follow and proceed with PT eval as appropriate.   Asta Corbridge 06/23/2014, 12:06 PM  Pager [phone]

## 2014-06-23 NOTE — Progress Notes (Addendum)
[facility] PROGRESS NOTE  Brandon Hardy KNL:[id number] DOB: 15-Jun-1927 DOA: 06/22/2014 PCP: Gaye Alken, MD  Brief narrative 81 year old male with alcoholic cirrhosis with esophageal varices, chronic thrombus cytopenia, anemia, chronic leg swelling, chronic leg weakness, history of stroke in 2014 GERD history of GI bleed, PFO, and severe malnutrition brought in by family for slurred speech and generalized weakness. Patient reports having off and on diarrhea for the past 3 weeks with poor appetite. On the night prior to admission patient was found to be confused, not acting himself with some slurred speech. He also had 2 episode of fall at home and unable to stand up.  Patient admitted for CVA workup. Head CT and MRI brain negative. He was also found to have markedly elevated troponin in the absence of chest pain and obvious EKG changes. He was also found to have low blood pressure with a possible right middle lobe pneumonia. Patient started on empiric antibiotic for pneumonia, stroke workup with cardiology and neurology consulted.  Assessment/Plan: TIA vs CVA Patient does have some left-sided facial droop with new left lower extremity weakness. MRI brain negative for acute stroke. 2-D echo and carotid Dopplers pending. Swallowing eval, PT/OT ordered. -Continue aspirin. Check A1c. Lipid panel normal.  Markedly elevated troponin No significant EKG changes and patient denies any chest pain symptoms. Troponin of 5.6 this morning. Check 2-D echo. Cardiology do not suggest anticoagulation given anemia, thrombocytopenia and history of GI bleed. ?Possible demand ischemia with underlying illness. Will cycle serial enzymes.  Community acquired pneumonia Started on empiric Rocephin and azithromycin. Follow blood cultures. No signs of sepsis.  Dysphagia Reports to be chronic following prior neck surgery. Swallowing evaluation.  Failure to thrive and Severe protein calorie  malnutrition Nutrition was consulted.. Follow PT evaluation. May need goals of care discussion in the long-term.  Alcoholic cirrhosis with underlying anemia/ coagulopathy/ thrombocytopenia Hemoglobin and platelets currently around baseline. Monitor closely.   Diet: Dysphagia level III  DVT prophylaxis: SCDs.  INR of 1.6 due to coagulopathy  Code Status: Full code Family Communication: None at bedside Disposition Plan: Currently inpatient pending completion of workup.   Consultants:  Neurology  Cardiology  Procedures:  CT angiogram of the chest with PE protocol  MRI brain/MRA head neck  CT head and cervical spine  2-D echo  Antibiotics:  IV Rocephin and azithromycin since 6/21  HPI/Subjective: Patient seen and examined. Admission H&P reviewed. Denies chest pain or shortness of breath. Reports left lower extremity weakness. Also reports poor appetite and some dysphagia.  Objective: Filed Vitals:   06/23/14 0755  BP: 95/51  Pulse: 53  Temp:   Resp: 19    Intake/Output Summary (Last 24 hours) at 06/23/14 1047 Last data filed at 06/23/14 7902  Gross per 24 hour  Intake   1000 ml  Output    700 ml  Net    300 ml   Filed Weights   06/23/14 0157  Weight: 55.384 kg (122 lb 1.6 oz)    Exam:   General:  Elderly cachectic male in no acute distress  HEENT: Pallor present, temporal wasting, no icterus, dry oral mucosa, supple neck  Cardiovascular: Normal S1 and S2, systolic murmur 3/6, nor rubs or gallop  Respiratory: Clear to auscultation bilaterally, no added sounds  GI: Soft, nondistended, nontender, bowel sounds present  Musculoskeletal: Warm, no edema,   Alert and oriented, mild left facial droop. 4/5 power in left lower extremity  Data Reviewed: Basic Metabolic Panel:  Recent Labs Lab 06/22/14 2114  06/22/14 2122 06/23/14 0515  NA 135 138 136  K 4.8 4.8 3.6  CL 105 102 109  CO2 22  --  23  GLUCOSE 97 93 93  BUN 14 18 13   CREATININE  1.14 1.00 0.90  CALCIUM 7.8*  --  7.6*   Liver Function Tests:  Recent Labs Lab 06/22/14 2114 06/23/14 0515  AST 44* 60*  ALT 12* 11*  ALKPHOS 59 58  BILITOT 1.2 1.2  PROT 5.6* 5.2*  ALBUMIN 2.2* 2.0*   No results for input(s): LIPASE, AMYLASE in the last 168 hours.  Recent Labs Lab 06/22/14 2257  AMMONIA 26   CBC:  Recent Labs Lab 06/22/14 2114 06/22/14 2122 06/23/14 0515  WBC 7.0  --  7.0  NEUTROABS 5.7  --  5.5  HGB 8.5* 9.2* 8.8*  HCT 25.4* 27.0* 26.6*  MCV 98.1  --  98.2  PLT 90*  --  99*   Cardiac Enzymes:  Recent Labs Lab 06/22/14 2302 06/23/14 0515  CKTOTAL 301  --   TROPONINI 0.93* 5.62*   BNP (last 3 results) No results for input(s): BNP in the last 8760 hours.  ProBNP (last 3 results) No results for input(s): PROBNP in the last 8760 hours.  CBG:  Recent Labs Lab 06/23/14 0342 06/23/14 0617 06/23/14 0821  GLUCAP 92 91 90    Recent Results (from the past 240 hour(s))  MRSA PCR Screening     Status: None   Collection Time: 06/23/14  1:57 AM  Result Value Ref Range Status   MRSA by PCR NEGATIVE NEGATIVE Final    Comment:        The GeneXpert MRSA Assay (FDA approved for NASAL specimens only), is one component of a comprehensive MRSA colonization surveillance program. It is not intended to diagnose MRSA infection nor to guide or monitor treatment for MRSA infections.      Studies: Dg Chest 1 View  06/22/2014   CLINICAL DATA:  Fall from chair with chest pain and pelvic pain, initial encounter  EXAM: CHEST  1 VIEW  COMPARISON:  06/09/2013  FINDINGS: Cardiac shadow is mildly enlarged but stable. The lungs are well aerated bilaterally. Stable interstitial changes are noted bilaterally. No focal infiltrate or sizable effusion is seen. No acute bony abnormality is noted.  IMPRESSION: No acute abnormality noted.   Electronically Signed   By: Alcide Clever M.D.   On: 06/22/2014 20:52   Dg Pelvis 1-2 Views  06/22/2014   CLINICAL DATA:   Fall from chair with pelvic pain  EXAM: PELVIS - 1-2 VIEW  COMPARISON:  07/30/2011  FINDINGS: Diffuse osteopenia is again identified. Postsurgical changes are noted in the lower lumbar spine. Degenerative changes of the hip joints are noted bilaterally. No acute fracture or dislocation is seen.  IMPRESSION: Diffuse osteopenia.  No acute bony abnormality is noted.   Electronically Signed   By: Alcide Clever M.D.   On: 06/22/2014 21:00   Ct Head Wo Contrast  06/22/2014   CLINICAL DATA:  Slid from chair, slurred speech  EXAM: CT HEAD WITHOUT CONTRAST  CT CERVICAL SPINE WITHOUT CONTRAST  TECHNIQUE: Multidetector CT imaging of the head and cervical spine was performed following the standard protocol without intravenous contrast. Multiplanar CT image reconstructions of the cervical spine were also generated.  COMPARISON:  08/23/2012  FINDINGS: CT HEAD FINDINGS  Bony calvarium is intact. No gross soft tissue abnormality is noted. Mild atrophic changes and chronic white matter ischemic change is seen. Changes of prior right  middle cerebral artery infarct are noted. These are new from the prior examination. No acute hemorrhage, acute infarction or space-occupying mass lesion is identified.  CT CERVICAL SPINE FINDINGS  Seven cervical segments are well visualized. Interbody fusion is noted from C3-C7 with anterior fixation. Posterior laminectomy is noted at C5. No acute fracture or acute facet abnormality is noted. Multilevel osteophytic changes and facet hypertrophic changes are noted.  IMPRESSION: CT of the head: Chronic white matter ischemic change and atrophic change. Prior right middle cerebral artery infarct is noted  CT of the cervical spine: Postsurgical changes from C3-C7. Multilevel degenerative changes are noted. No acute abnormality is seen.   Electronically Signed   By: Alcide Clever M.D.   On: 06/22/2014 21:23   Ct Angio Chest Pe W/cm &/or Wo Cm  06/23/2014   CLINICAL DATA:  Patient with lower extremity  pitting edema. History of heart murmur and left lower extremity blood clots.  EXAM: CT ANGIOGRAPHY CHEST WITH CONTRAST  TECHNIQUE: Multidetector CT imaging of the chest was performed using the standard protocol during bolus administration of intravenous contrast. Multiplanar CT image reconstructions and MIPs were obtained to evaluate the vascular anatomy.  CONTRAST:  80mL OMNIPAQUE IOHEXOL 350 MG/ML SOLN  COMPARISON:  Chest radiograph 06/22/2014  FINDINGS: Mediastinum/Nodes: No enlarged axillary, mediastinal or hilar lymphadenopathy. No pericardial effusion. Coronary arterial vascular calcifications. Visualized thyroid is unremarkable.  Adequate opacification of the main pulmonary artery for the detection of pulmonary embolism. Motion artifact limits evaluation of the distal pulmonary arteries. No evidence for pulmonary embolism.  Lungs/Pleura: Central airways are patent. Dependent ground-glass and consolidative opacities within the right lower lobe. No pleural effusion or pneumothorax. Tree-in-bud ground-glass and nodular opacities within the anterior left upper lobe (image 54; series 6).  Upper abdomen: Nonobstructing 3 mm stone superior pole right kidney. The gallbladder appears enlarged however is incompletely visualized.  Musculoskeletal: Mid thoracic spine degenerative changes without aggressive or acute appearing osseous lesions.  Review of the MIP images confirms the above findings.  IMPRESSION: No evidence for pulmonary embolism. Evaluation limited within the distal pulmonary arteries particularly within the lower lobes to motion artifact.  Consolidative opacity within subpleural right lobe may represent pneumonia in the appropriate clinical setting. Additionally there are scattered tree-in-bud nodular opacities within the anterior left upper lobe, likely infectious or inflammatory etiology. Recommend continued radiographic followup to ensure resolution.   Electronically Signed   By: Annia Belt M.D.   On:  06/23/2014 02:06   Ct Cervical Spine Wo Contrast  06/22/2014   CLINICAL DATA:  Slid from chair, slurred speech  EXAM: CT HEAD WITHOUT CONTRAST  CT CERVICAL SPINE WITHOUT CONTRAST  TECHNIQUE: Multidetector CT imaging of the head and cervical spine was performed following the standard protocol without intravenous contrast. Multiplanar CT image reconstructions of the cervical spine were also generated.  COMPARISON:  08/23/2012  FINDINGS: CT HEAD FINDINGS  Bony calvarium is intact. No gross soft tissue abnormality is noted. Mild atrophic changes and chronic white matter ischemic change is seen. Changes of prior right middle cerebral artery infarct are noted. These are new from the prior examination. No acute hemorrhage, acute infarction or space-occupying mass lesion is identified.  CT CERVICAL SPINE FINDINGS  Seven cervical segments are well visualized. Interbody fusion is noted from C3-C7 with anterior fixation. Posterior laminectomy is noted at C5. No acute fracture or acute facet abnormality is noted. Multilevel osteophytic changes and facet hypertrophic changes are noted.  IMPRESSION: CT of the head: Chronic white matter ischemic  change and atrophic change. Prior right middle cerebral artery infarct is noted  CT of the cervical spine: Postsurgical changes from C3-C7. Multilevel degenerative changes are noted. No acute abnormality is seen.   Electronically Signed   By: Alcide Clever M.D.   On: 06/22/2014 21:23   Mr Brain Wo Contrast  06/23/2014   CLINICAL DATA:  Slurred speech and generalized weakness. Three-week history of diarrhea, recent 40 pound weight loss. Sepsis. History of thrombocytopenia and stroke.  EXAM: MRI HEAD WITHOUT CONTRAST  MRA HEAD WITHOUT CONTRAST  TECHNIQUE: Multiplanar, multiecho pulse sequences of the brain and surrounding structures were obtained without intravenous contrast. Angiographic images of the head were obtained using MRA technique without contrast.  COMPARISON:  CT of the head  June 22, 2014 at 2102 hours and MRI/MRA of the head August 23, 2012  FINDINGS: MRI HEAD FINDINGS  No reduced diffusion to suggest acute ischemia. Chronic microhemorrhage LEFT thalamus without new susceptibility artifact to suggest hemorrhage. RIGHT frontotemporal parietal encephalomalacia. Tiny bold bilateral small cerebellar infarcts. Mild ex vacuo dilatation the RIGHT atrium, ventricles and sulci are otherwise normal for patient's age. Patchy to confluent supratentorial and pontine white matter T2 hyperintensities, similar to prior MRI without midline shift, mass effect or mass lesions.  No abnormal extra-axial fluid collections. Ocular globes and orbital contents are unremarkable. Mild ethmoid mucosal thickening without paranasal sinus air-fluid levels. Mild LEFT mastoid effusion. Under pneumatized RIGHT mastoid air cells. No abnormal sellar expansion. No cerebellar tonsillar ectopia. Patient is edentulous. Status post C3-4 suspected ACDF.  MRA HEAD FINDINGS  Anterior circulation: Normal flow related enhancement of the included cervical, petrous, cavernous and supra clinoid internal carotid arteries. Patent anterior communicating artery. Normal flow related enhancement of the anterior and middle cerebral arteries, including more distal segments. RIGHT M3 segment is widely patent, consistent with re- cannulization.  No large vessel occlusion, high-grade stenosis, abnormal luminal irregularity, aneurysm.  Posterior circulation: LEFT vertebral artery is dominant. Basilar artery is patent, with normal flow related enhancement of the main branch vessels. Tiny RIGHT posterior communicating artery is present. Normal flow related enhancement of the posterior cerebral arteries.  No large vessel occlusion, high-grade stenosis, abnormal luminal irregularity, aneurysm.  IMPRESSION: MRI HEAD: No acute intracranial process, specifically no acute ischemia.  Remote large RIGHT middle cerebral artery territory infarct. Multiple  small old cerebellar infarcts.  Moderate white matter changes compatible with chronic small vessel ischemic disease .  MRA HEAD: No large vessel occlusion or hemodynamically significant stenosis.   Electronically Signed   By: Awilda Metro M.D.   On: 06/23/2014 01:11   Mr Maxine Glenn Head/brain Wo Cm  06/23/2014   CLINICAL DATA:  Slurred speech and generalized weakness. Three-week history of diarrhea, recent 40 pound weight loss. Sepsis. History of thrombocytopenia and stroke.  EXAM: MRI HEAD WITHOUT CONTRAST  MRA HEAD WITHOUT CONTRAST  TECHNIQUE: Multiplanar, multiecho pulse sequences of the brain and surrounding structures were obtained without intravenous contrast. Angiographic images of the head were obtained using MRA technique without contrast.  COMPARISON:  CT of the head June 22, 2014 at 2102 hours and MRI/MRA of the head August 23, 2012  FINDINGS: MRI HEAD FINDINGS  No reduced diffusion to suggest acute ischemia. Chronic microhemorrhage LEFT thalamus without new susceptibility artifact to suggest hemorrhage. RIGHT frontotemporal parietal encephalomalacia. Tiny bold bilateral small cerebellar infarcts. Mild ex vacuo dilatation the RIGHT atrium, ventricles and sulci are otherwise normal for patient's age. Patchy to confluent supratentorial and pontine white matter T2 hyperintensities, similar to prior  MRI without midline shift, mass effect or mass lesions.  No abnormal extra-axial fluid collections. Ocular globes and orbital contents are unremarkable. Mild ethmoid mucosal thickening without paranasal sinus air-fluid levels. Mild LEFT mastoid effusion. Under pneumatized RIGHT mastoid air cells. No abnormal sellar expansion. No cerebellar tonsillar ectopia. Patient is edentulous. Status post C3-4 suspected ACDF.  MRA HEAD FINDINGS  Anterior circulation: Normal flow related enhancement of the included cervical, petrous, cavernous and supra clinoid internal carotid arteries. Patent anterior communicating artery.  Normal flow related enhancement of the anterior and middle cerebral arteries, including more distal segments. RIGHT M3 segment is widely patent, consistent with re- cannulization.  No large vessel occlusion, high-grade stenosis, abnormal luminal irregularity, aneurysm.  Posterior circulation: LEFT vertebral artery is dominant. Basilar artery is patent, with normal flow related enhancement of the main branch vessels. Tiny RIGHT posterior communicating artery is present. Normal flow related enhancement of the posterior cerebral arteries.  No large vessel occlusion, high-grade stenosis, abnormal luminal irregularity, aneurysm.  IMPRESSION: MRI HEAD: No acute intracranial process, specifically no acute ischemia.  Remote large RIGHT middle cerebral artery territory infarct. Multiple small old cerebellar infarcts.  Moderate white matter changes compatible with chronic small vessel ischemic disease .  MRA HEAD: No large vessel occlusion or hemodynamically significant stenosis.   Electronically Signed   By: Awilda Metro M.D.   On: 06/23/2014 01:11    Scheduled Meds: . antiseptic oral rinse  7 mL Mouth Rinse BID  . aspirin EC  81 mg Oral Daily  . azithromycin  500 mg Intravenous Q24H  . cefTRIAXone (ROCEPHIN)  IV  1 g Intravenous Q24H  . gabapentin  100 mg Oral BID  . pantoprazole  40 mg Oral Daily  . potassium chloride  10 mEq Intravenous Q1 Hr x 3   Continuous Infusions:     Time spent: 35 minutes    Nailani Full  [facility] Pager [phone]. If 7PM-7AM, please contact night-coverage at [email or web address], password [facility] 06/23/2014, 10:47 AM  LOS: 1 day

## 2014-06-23 NOTE — ED Notes (Signed)
Melissa RN on 3W updated on delay of care

## 2014-06-23 NOTE — Consult Note (Signed)
Referring Physician: No referring provider defined for this encounter. Primary Physician: Gaye Alken, MD Primary Cardiologist: none Reason for Consultation: elevated troponin  HPI:  Brandon Hardy is a 81 y.o. male with past medical history of chronic thrombocytopenia, chronic leg swelling, GERD, right MCA CVA in 2014, small PFO, history of GI bleed, history of alcohol abuse, documented history of esophageal varices, multiple back surgery with chronic leg weakness. The patient presents with complaints of slurred speech and generalized weakness. There was also questionable left-sided weakness reported on presentation therefore patient was workup for CVA. His MRA of the brain didn't show any large vessel intracranial stenosis. His CT head didn't show any acute new strokes. During my examination and examination of the internal medicine patient didn't have any focal neurologic deficits.   However further workup was notable for point of care troponin I of 2.65, with regular troponin I measured at 0.93. Patient denied active symptoms of chest pain, shortness of breath, syncope or presyncope, lower extremity edema, PND or orthopnea, frequent or prolonged palpitations  Patient reported some diarrhea that he was taking Imodium for. As per family the patient has lost 40 pounds and as per documentation he has lost 24 pounds in the last 2 years. The patient is coming from home And at his baseline independent for most of his ADL. At his baseline the patient ambulates with cane and a walker and has difficulty standing up on his own after. Patient was not started on warfarin secondary to advanced age, multiple carbonated his, thrombocytopenia in the past.  Review of Systems:  12 systems were reviewed nad were negative except mentioned in the HPI      Past Medical History  Diagnosis Date  . Arthritis   . Blood transfusion   . Renal disorder   . Thrombocytopenia   . Heart murmur   .  Chronic back pain   . Peripheral edema     was on Lasix but has been off for about 3months  . GERD (gastroesophageal reflux disease)     takes Prilosec daily  . GI bleed     hx of   . Constipation     takes Senna daily and eats Metamucil daily  . Urinary frequency   . History of kidney stones   . History of blood transfusion     no abnormal reaction noted  . Cataracts, bilateral     immature  . History of blood clots     left leg  . History of Clostridium difficile infection   . BPH (benign prostatic hyperplasia)   . Stroke 08/22/2012  . Esophageal varices with bleeding approx 20 years ago    per pt history  . Thrombocytopenia, unspecified 08/28/2012  . PFO (patent foramen ovale) 08/28/2012    Per TEE 08/2012   Past Surgical History  Procedure Laterality Date  . Fracture surgery    . Back surgery      multiple  . Neck surgery  2013    x 2/plates, screws, decreased mobility  . Left finger surgery    . Foot surgery    . Colonoscopy    . Esophagogastroduodenoscopy    . Gi bleed    . Ulnar nerve transposition  01/09/2012    Procedure: ULNAR NERVE DECOMPRESSION/TRANSPOSITION;  Surgeon: Kathryne Hitch, MD;  Location: [facility] OR;  Service: Orthopedics;  Laterality: Right;  Right ulnar nerve decompression at the elbow/cubital tunnel and transposition  . Inguinal hernia repair Right 04/23/2012  Procedure: HERNIA REPAIR INGUINAL ADULT;  Surgeon: Kandis Cocking, MD;  Location: [facility] ORS;  Service: General;  Laterality: Right;  . Insertion of mesh Right 04/23/2012    Procedure: INSERTION OF MESH;  Surgeon: Kandis Cocking, MD;  Location: [facility] ORS;  Service: General;  Laterality: Right;  . Tee without cardioversion N/A 08/26/2012    Procedure: TRANSESOPHAGEAL ECHOCARDIOGRAM (TEE);  Surgeon: Pricilla Riffle, MD;  Location: [facility] ENDOSCOPY;  Service: Cardiovascular;  Laterality: N/A;     Current Medications: .  stroke: mapping our early stages of recovery book   Does not apply Once  . aspirin EC   325 mg Oral Daily  . aspirin  300 mg Rectal Daily   Or  . aspirin  325 mg Oral Daily  . gabapentin  100 mg Oral BID  . pantoprazole  40 mg Oral Daily   Infusions:     (Not in a hospital admission)   Allergies  Allergen Reactions  . Chlorhexidine Rash    History   Social History  . Marital Status: Married    Spouse Name: N/A  . Number of Children: N/A  . Years of Education: N/A   Occupational History  . Not on file.   Social History Main Topics  . Smoking status: Former Smoker    Types: Cigarettes    Quit date: 02/03/1973  . Smokeless tobacco: Never Used  . Alcohol Use: No  . Drug Use: No  . Sexual Activity: Not Currently   Other Topics Concern  . Not on file   Social History Narrative    History reviewed. No pertinent family history. Family Status  Relation Status Death Age  . Mother Deceased   . Father Deceased   . Sister    . Brother      PHYSICAL EXAM: Filed Vitals:   06/23/14 0110  BP:   Pulse:   Temp: 98.7 F (37.1 C)  Resp:      Intake/Output Summary (Last 24 hours) at 06/23/14 0136 Last data filed at 06/23/14 0035  Gross per 24 hour  Intake   1000 ml  Output      0 ml  Net   1000 ml    General:  Well appearing. No respiratory difficulty HEENT: normal, no teeth Neck: supple. no JVD. No lymphadenopathy or thryomegaly appreciated. Cor: PMI nondisplaced. Regular rate & rhythm. No rubs, gallops or murmurs. Lungs: Horse breath sounds bilaterally Abdomen: soft, nontender, nondistended. No hepatosplenomegaly. No bruits or masses. Good bowel sounds. Extremities: no cyanosis, clubbing, rash, 1+ pretibial edema edema Neuro: alert & oriented x 3, grossly non-focal, affect pleasant.  Results for orders placed or performed during the hospital encounter of 06/22/14 (from the past 24 hour(s))  Ethanol     Status: None   Collection Time: 06/22/14  9:14 PM  Result Value Ref Range   Alcohol, Ethyl (B) <5 <5 mg/dL  Protime-INR     Status:  Abnormal   Collection Time: 06/22/14  9:14 PM  Result Value Ref Range   Prothrombin Time 18.5 (H) 11.6 - 15.2 seconds   INR 1.54 (H) 0.00 - 1.49  APTT     Status: None   Collection Time: 06/22/14  9:14 PM  Result Value Ref Range   aPTT 27 24 - 37 seconds  CBC     Status: Abnormal   Collection Time: 06/22/14  9:14 PM  Result Value Ref Range   WBC 7.0 4.0 - 10.5 K/uL   RBC 2.59 (L) 4.22 -  5.81 MIL/uL   Hemoglobin 8.5 (L) 13.0 - 17.0 g/dL   HCT 16.1 (L) 09.6 - 04.5 %   MCV 98.1 78.0 - 100.0 fL   MCH 32.8 26.0 - 34.0 pg   MCHC 33.5 30.0 - 36.0 g/dL   RDW 40.9 81.1 - 91.4 %   Platelets 90 (L) 150 - 400 K/uL  Differential     Status: Abnormal   Collection Time: 06/22/14  9:14 PM  Result Value Ref Range   Neutrophils Relative % 82 (H) 43 - 77 %   Neutro Abs 5.7 1.7 - 7.7 K/uL   Lymphocytes Relative 8 (L) 12 - 46 %   Lymphs Abs 0.5 (L) 0.7 - 4.0 K/uL   Monocytes Relative 10 3 - 12 %   Monocytes Absolute 0.7 0.1 - 1.0 K/uL   Eosinophils Relative 0 0 - 5 %   Eosinophils Absolute 0.0 0.0 - 0.7 K/uL   Basophils Relative 0 0 - 1 %   Basophils Absolute 0.0 0.0 - 0.1 K/uL  Comprehensive metabolic panel     Status: Abnormal   Collection Time: 06/22/14  9:14 PM  Result Value Ref Range   Sodium 135 135 - 145 mmol/L   Potassium 4.8 3.5 - 5.1 mmol/L   Chloride 105 101 - 111 mmol/L   CO2 22 22 - 32 mmol/L   Glucose, Bld 97 65 - 99 mg/dL   BUN 14 6 - 20 mg/dL   Creatinine, Ser 7.82 0.61 - 1.24 mg/dL   Calcium 7.8 (L) 8.9 - 10.3 mg/dL   Total Protein 5.6 (L) 6.5 - 8.1 g/dL   Albumin 2.2 (L) 3.5 - 5.0 g/dL   AST 44 (H) 15 - 41 U/L   ALT 12 (L) 17 - 63 U/L   Alkaline Phosphatase 59 38 - 126 U/L   Total Bilirubin 1.2 0.3 - 1.2 mg/dL   GFR calc non Af Amer 57 (L) >60 mL/min   GFR calc Af Amer >60 >60 mL/min   Anion gap 8 5 - 15  I-Stat Chem 8, ED  (not at [facility], [facility])     Status: Abnormal   Collection Time: 06/22/14  9:22 PM  Result Value Ref Range   Sodium 138 135 - 145 mmol/L    Potassium 4.8 3.5 - 5.1 mmol/L   Chloride 102 101 - 111 mmol/L   BUN 18 6 - 20 mg/dL   Creatinine, Ser 9.56 0.61 - 1.24 mg/dL   Glucose, Bld 93 65 - 99 mg/dL   Calcium, Ion 2.13 (L) 1.13 - 1.30 mmol/L   TCO2 24 0 - 100 mmol/L   Hemoglobin 9.2 (L) 13.0 - 17.0 g/dL   HCT 08.6 (L) 57.8 - 46.9 %  Lactic acid, plasma     Status: None   Collection Time: 06/22/14 10:57 PM  Result Value Ref Range   Lactic Acid, Venous 1.4 0.5 - 2.0 mmol/L  Ammonia     Status: None   Collection Time: 06/22/14 10:57 PM  Result Value Ref Range   Ammonia 26 9 - 35 umol/L  I-stat troponin, ED (not at [facility], [facility])     Status: Abnormal   Collection Time: 06/22/14 11:00 PM  Result Value Ref Range   Troponin i, poc 2.65 (HH) 0.00 - 0.08 ng/mL   Comment NOTIFIED PHYSICIAN    Comment 3          Troponin I     Status: Abnormal   Collection Time: 06/22/14 11:02 PM  Result Value Ref  Range   Troponin I 0.93 (HH) <0.031 ng/mL  D-dimer, quantitative (not at [facility])     Status: Abnormal   Collection Time: 06/22/14 11:02 PM  Result Value Ref Range   D-Dimer, Quant >20.00 (H) 0.00 - 0.48 ug/mL-FEU  CK     Status: None   Collection Time: 06/22/14 11:02 PM  Result Value Ref Range   Total CK 301 49 - 397 U/L   Radiology:  Dg Chest 1 View  06/22/2014   CLINICAL DATA:  Fall from chair with chest pain and pelvic pain, initial encounter  EXAM: CHEST  1 VIEW  COMPARISON:  06/09/2013  FINDINGS: Cardiac shadow is mildly enlarged but stable. The lungs are well aerated bilaterally. Stable interstitial changes are noted bilaterally. No focal infiltrate or sizable effusion is seen. No acute bony abnormality is noted.  IMPRESSION: No acute abnormality noted.   Electronically Signed   By: Alcide Clever M.D.   On: 06/22/2014 20:52   Dg Pelvis 1-2 Views  06/22/2014   CLINICAL DATA:  Fall from chair with pelvic pain  EXAM: PELVIS - 1-2 VIEW  COMPARISON:  07/30/2011  FINDINGS: Diffuse osteopenia is again identified. Postsurgical changes are  noted in the lower lumbar spine. Degenerative changes of the hip joints are noted bilaterally. No acute fracture or dislocation is seen.  IMPRESSION: Diffuse osteopenia.  No acute bony abnormality is noted.   Electronically Signed   By: Alcide Clever M.D.   On: 06/22/2014 21:00   Ct Head Wo Contrast  06/22/2014   CLINICAL DATA:  Slid from chair, slurred speech  EXAM: CT HEAD WITHOUT CONTRAST  CT CERVICAL SPINE WITHOUT CONTRAST  TECHNIQUE: Multidetector CT imaging of the head and cervical spine was performed following the standard protocol without intravenous contrast. Multiplanar CT image reconstructions of the cervical spine were also generated.  COMPARISON:  08/23/2012  FINDINGS: CT HEAD FINDINGS  Bony calvarium is intact. No gross soft tissue abnormality is noted. Mild atrophic changes and chronic white matter ischemic change is seen. Changes of prior right middle cerebral artery infarct are noted. These are new from the prior examination. No acute hemorrhage, acute infarction or space-occupying mass lesion is identified.  CT CERVICAL SPINE FINDINGS  Seven cervical segments are well visualized. Interbody fusion is noted from C3-C7 with anterior fixation. Posterior laminectomy is noted at C5. No acute fracture or acute facet abnormality is noted. Multilevel osteophytic changes and facet hypertrophic changes are noted.  IMPRESSION: CT of the head: Chronic white matter ischemic change and atrophic change. Prior right middle cerebral artery infarct is noted  CT of the cervical spine: Postsurgical changes from C3-C7. Multilevel degenerative changes are noted. No acute abnormality is seen.   Electronically Signed   By: Alcide Clever M.D.   On: 06/22/2014 21:23   Ct Cervical Spine Wo Contrast  06/22/2014   CLINICAL DATA:  Slid from chair, slurred speech  EXAM: CT HEAD WITHOUT CONTRAST  CT CERVICAL SPINE WITHOUT CONTRAST  TECHNIQUE: Multidetector CT imaging of the head and cervical spine was performed following the  standard protocol without intravenous contrast. Multiplanar CT image reconstructions of the cervical spine were also generated.  COMPARISON:  08/23/2012  FINDINGS: CT HEAD FINDINGS  Bony calvarium is intact. No gross soft tissue abnormality is noted. Mild atrophic changes and chronic white matter ischemic change is seen. Changes of prior right middle cerebral artery infarct are noted. These are new from the prior examination. No acute hemorrhage, acute infarction or space-occupying mass lesion is  identified.  CT CERVICAL SPINE FINDINGS  Seven cervical segments are well visualized. Interbody fusion is noted from C3-C7 with anterior fixation. Posterior laminectomy is noted at C5. No acute fracture or acute facet abnormality is noted. Multilevel osteophytic changes and facet hypertrophic changes are noted.  IMPRESSION: CT of the head: Chronic white matter ischemic change and atrophic change. Prior right middle cerebral artery infarct is noted  CT of the cervical spine: Postsurgical changes from C3-C7. Multilevel degenerative changes are noted. No acute abnormality is seen.   Electronically Signed   By: Alcide Clever M.D.   On: 06/22/2014 21:23   Mr Brain Wo Contrast  06/23/2014   CLINICAL DATA:  Slurred speech and generalized weakness. Three-week history of diarrhea, recent 40 pound weight loss. Sepsis. History of thrombocytopenia and stroke.  EXAM: MRI HEAD WITHOUT CONTRAST  MRA HEAD WITHOUT CONTRAST  TECHNIQUE: Multiplanar, multiecho pulse sequences of the brain and surrounding structures were obtained without intravenous contrast. Angiographic images of the head were obtained using MRA technique without contrast.  COMPARISON:  CT of the head June 22, 2014 at 2102 hours and MRI/MRA of the head August 23, 2012  FINDINGS: MRI HEAD FINDINGS  No reduced diffusion to suggest acute ischemia. Chronic microhemorrhage LEFT thalamus without new susceptibility artifact to suggest hemorrhage. RIGHT frontotemporal parietal  encephalomalacia. Tiny bold bilateral small cerebellar infarcts. Mild ex vacuo dilatation the RIGHT atrium, ventricles and sulci are otherwise normal for patient's age. Patchy to confluent supratentorial and pontine white matter T2 hyperintensities, similar to prior MRI without midline shift, mass effect or mass lesions.  No abnormal extra-axial fluid collections. Ocular globes and orbital contents are unremarkable. Mild ethmoid mucosal thickening without paranasal sinus air-fluid levels. Mild LEFT mastoid effusion. Under pneumatized RIGHT mastoid air cells. No abnormal sellar expansion. No cerebellar tonsillar ectopia. Patient is edentulous. Status post C3-4 suspected ACDF.  MRA HEAD FINDINGS  Anterior circulation: Normal flow related enhancement of the included cervical, petrous, cavernous and supra clinoid internal carotid arteries. Patent anterior communicating artery. Normal flow related enhancement of the anterior and middle cerebral arteries, including more distal segments. RIGHT M3 segment is widely patent, consistent with re- cannulization.  No large vessel occlusion, high-grade stenosis, abnormal luminal irregularity, aneurysm.  Posterior circulation: LEFT vertebral artery is dominant. Basilar artery is patent, with normal flow related enhancement of the main branch vessels. Tiny RIGHT posterior communicating artery is present. Normal flow related enhancement of the posterior cerebral arteries.  No large vessel occlusion, high-grade stenosis, abnormal luminal irregularity, aneurysm.  IMPRESSION: MRI HEAD: No acute intracranial process, specifically no acute ischemia.  Remote large RIGHT middle cerebral artery territory infarct. Multiple small old cerebellar infarcts.  Moderate white matter changes compatible with chronic small vessel ischemic disease .  MRA HEAD: No large vessel occlusion or hemodynamically significant stenosis.   Electronically Signed   By: Awilda Metro M.D.   On: 06/23/2014 01:11    Mr Maxine Glenn Head/brain Wo Cm  06/23/2014   CLINICAL DATA:  Slurred speech and generalized weakness. Three-week history of diarrhea, recent 40 pound weight loss. Sepsis. History of thrombocytopenia and stroke.  EXAM: MRI HEAD WITHOUT CONTRAST  MRA HEAD WITHOUT CONTRAST  TECHNIQUE: Multiplanar, multiecho pulse sequences of the brain and surrounding structures were obtained without intravenous contrast. Angiographic images of the head were obtained using MRA technique without contrast.  COMPARISON:  CT of the head June 22, 2014 at 2102 hours and MRI/MRA of the head August 23, 2012  FINDINGS: MRI HEAD FINDINGS  No  reduced diffusion to suggest acute ischemia. Chronic microhemorrhage LEFT thalamus without new susceptibility artifact to suggest hemorrhage. RIGHT frontotemporal parietal encephalomalacia. Tiny bold bilateral small cerebellar infarcts. Mild ex vacuo dilatation the RIGHT atrium, ventricles and sulci are otherwise normal for patient's age. Patchy to confluent supratentorial and pontine white matter T2 hyperintensities, similar to prior MRI without midline shift, mass effect or mass lesions.  No abnormal extra-axial fluid collections. Ocular globes and orbital contents are unremarkable. Mild ethmoid mucosal thickening without paranasal sinus air-fluid levels. Mild LEFT mastoid effusion. Under pneumatized RIGHT mastoid air cells. No abnormal sellar expansion. No cerebellar tonsillar ectopia. Patient is edentulous. Status post C3-4 suspected ACDF.  MRA HEAD FINDINGS  Anterior circulation: Normal flow related enhancement of the included cervical, petrous, cavernous and supra clinoid internal carotid arteries. Patent anterior communicating artery. Normal flow related enhancement of the anterior and middle cerebral arteries, including more distal segments. RIGHT M3 segment is widely patent, consistent with re- cannulization.  No large vessel occlusion, high-grade stenosis, abnormal luminal irregularity, aneurysm.   Posterior circulation: LEFT vertebral artery is dominant. Basilar artery is patent, with normal flow related enhancement of the main branch vessels. Tiny RIGHT posterior communicating artery is present. Normal flow related enhancement of the posterior cerebral arteries.  No large vessel occlusion, high-grade stenosis, abnormal luminal irregularity, aneurysm.  IMPRESSION: MRI HEAD: No acute intracranial process, specifically no acute ischemia.  Remote large RIGHT middle cerebral artery territory infarct. Multiple small old cerebellar infarcts.  Moderate white matter changes compatible with chronic small vessel ischemic disease .  MRA HEAD: No large vessel occlusion or hemodynamically significant stenosis.   Electronically Signed   By: Awilda Metro M.D.   On: 06/23/2014 01:11   CTA chest: IMPRESSION: No evidence for pulmonary embolism. Evaluation limited within the distal pulmonary arteries particularly within the lower lobes to motion artifact. Consolidative opacity within subpleural right lobe may represent pneumonia in the appropriate clinical setting. Additionally there are scattered tree-in-bud nodular opacities within the anterior left upper lobe, likely infectious or inflammatory etiology. Recommend continued radiographic followup to ensure resolution.  ECG: Atrial fibrillation 62 beats per minutes, probable inferior old MI, normal axis, no significant changes compared to previous EKGs  ECHO (limited bedside echocardiogram): Overall somewhat difficult study secondary to chest wall and lung interference. Patient was not able to rotate on to his left side. Low normal to mildly reduced LVEF; grossly normal RV function, inferior vena cava normal size with more than 50% respiratory variation suggestive of normal CVP Bedside limited lower extremity venous ultrasound: negative for femoral or popliteal DVT  ASSESSMENT: Elevated troponin / NonSTEMI  Etiology of troponin elevation is unclear. Patient  did present with borderline hypotension however his EKG is unremarkable other than chronic atrial fibrillation, he doesn't have pulmonary embolism, his left ventricular ejection fraction doesn't appear to be significantly reduced though windows are suboptimal for regional wall motion abnormalities evaluation. Patient does not appear to be floridly septic with normal lactate. Patient also is not the best candidate for antithrombotic therapy given history of variceal bleed, slightly elevated INR on presentation as well as thrombocytopenia.  PLAN/DISCUSSION: - Formal echo in the morning - continue to cycle cardiac enzymes - Further workup of altered mental status and underlying hypotension including urinalysis, C. Difficile - May consider noninvasive ischemic evaluation once we clarify what triggered patient's presentation  cardiology will continue to follow    Nolon Nations, MD 06/23/2014 1:36 AM

## 2014-06-23 NOTE — ED Notes (Signed)
Provider bedside.

## 2014-06-23 NOTE — ED Notes (Signed)
Pt back from MRI; cardiology at bedside

## 2014-06-23 NOTE — Progress Notes (Signed)
STROKE TEAM PROGRESS NOTE   HISTORY Brandon LUEBBERS is an 81 y.o. male with a history of thrombocytopenia, GI bleed, stroke in August 2014 and PFO, presenting with recurrent episodes of slurred speech and left-sided weakness over the past 2 days. His son noticed slurred speech dragging of his right leg yesterday and wanted to bring patient to the ER but patient was not agreeable. He developed recurrent slurred speech as well as left facial droop and left sided weakness again sometime earlier today. Onset is unclear. By the time he arrived in the emergency room his speech had improved as had left-sided weakness. He was back to baseline at the time of this evaluation. NIH stroke score was 1, as patient had some difficulty remembering the current month. His been taking aspirin 81 mg per day up until about 3 weeks ago when he stopped it, in anticipation of the dental procedure. CT scan of his head showed old right MCA territory stroke as well as as bilateral small vessel white matter ischemic changes. There were no acute findings. He was LSN: 06/22/2014, early morning, time unclear. Patient was not administered TPA secondary to rapidly improved deficits. He was admitted for further evaluation and treatment.   SUBJECTIVE (INTERVAL HISTORY) His SLP just left the bedside.  Overall he feels his condition is rapidly improving.    OBJECTIVE Temp:  [97.9 F (36.6 C)-99.2 F (37.3 C)] 98.1 F (36.7 C) (06/21 0358) Pulse Rate:  [25-79] 53 (06/21 0755) Cardiac Rhythm:  [-] Atrial fibrillation (06/21 0600) Resp:  [10-19] 19 (06/21 0755) BP: (91-111)/(40-64) 95/51 mmHg (06/21 0755) SpO2:  [91 %-100 %] 92 % (06/21 0755) Weight:  [55.384 kg (122 lb 1.6 oz)] 55.384 kg (122 lb 1.6 oz) (06/21 0157)   Recent Labs Lab 06/23/14 0342 06/23/14 0617 06/23/14 0821  GLUCAP 92 91 90    Recent Labs Lab 06/22/14 2114 06/22/14 2122 06/23/14 0515  NA 135 138 136  K 4.8 4.8 3.6  CL 105 102 109  CO2 22  --  23   GLUCOSE 97 93 93  BUN 14 18 13   CREATININE 1.14 1.00 0.90  CALCIUM 7.8*  --  7.6*    Recent Labs Lab 06/22/14 2114 06/23/14 0515  AST 44* 60*  ALT 12* 11*  ALKPHOS 59 58  BILITOT 1.2 1.2  PROT 5.6* 5.2*  ALBUMIN 2.2* 2.0*    Recent Labs Lab 06/22/14 2114 06/22/14 2122 06/23/14 0515  WBC 7.0  --  7.0  NEUTROABS 5.7  --  5.5  HGB 8.5* 9.2* 8.8*  HCT 25.4* 27.0* 26.6*  MCV 98.1  --  98.2  PLT 90*  --  99*    Recent Labs Lab 06/22/14 2302 06/23/14 0515  CKTOTAL 301  --   TROPONINI 0.93* 5.62*    Recent Labs  06/22/14 2114 06/23/14 0515  LABPROT 18.5* 19.2*  INR 1.54* 1.62*    Recent Labs  06/23/14 0119  COLORURINE YELLOW  LABSPEC 1.012  PHURINE 5.5  GLUCOSEU NEGATIVE  HGBUR SMALL*  BILIRUBINUR NEGATIVE  KETONESUR NEGATIVE  PROTEINUR NEGATIVE  UROBILINOGEN 1.0  NITRITE NEGATIVE  LEUKOCYTESUR NEGATIVE       Component Value Date/Time   CHOL 76 06/23/2014 0515   TRIG 41 06/23/2014 0515   HDL 26* 06/23/2014 0515   CHOLHDL 2.9 06/23/2014 0515   VLDL 8 06/23/2014 0515   LDLCALC 42 06/23/2014 0515   Lab Results  Component Value Date   HGBA1C 4.6 08/24/2012      Component Value  Date/Time   LABOPIA POSITIVE* 06/23/2014 0119   COCAINSCRNUR NONE DETECTED 06/23/2014 0119   LABBENZ NONE DETECTED 06/23/2014 0119   AMPHETMU NONE DETECTED 06/23/2014 0119   THCU NONE DETECTED 06/23/2014 0119   LABBARB NONE DETECTED 06/23/2014 0119     Recent Labs Lab 06/22/14 2114  ETH <5    Dg Chest 1 View 06/22/2014   No acute abnormality noted.     Dg Pelvis 1-2 Views 06/22/2014    Diffuse osteopenia.  No acute bony abnormality is noted.     Ct Head Wo Contrast 06/22/2014   Chronic white matter ischemic change and atrophic change. Prior right middle cerebral artery infarct is noted    Ct Angio Chest Pe W/cm &/or Wo Cm 06/23/2014   No evidence for pulmonary embolism. Evaluation limited within the distal pulmonary arteries particularly within the lower  lobes to motion artifact.  Consolidative opacity within subpleural right lobe may represent pneumonia in the appropriate clinical setting. Additionally there are scattered tree-in-bud nodular opacities within the anterior left upper lobe, likely infectious or inflammatory etiology. Recommend continued radiographic followup to ensure resolution.     Ct Cervical Spine Wo Contrast 06/22/2014    Postsurgical changes from C3-C7. Multilevel degenerative changes are noted. No acute abnormality is seen.     MRI HEAD 06/23/2014    No acute intracranial process, specifically no acute ischemia.  Remote large RIGHT middle cerebral artery territory infarct. Multiple small old cerebellar infarcts.  Moderate white matter changes compatible with chronic small vessel ischemic disease .    MRA HEAD  06/23/2014    No large vessel occlusion or hemodynamically significant stenosis.       PHYSICAL EXAM Pleasant frail  elderly caucasian male not in distress. . Afebrile. Head is nontraumatic. Neck is supple without bruit.    Cardiac exam no murmur or gallop. Lungs are clear to auscultation. Distal pulses are well felt. Neurological Exam ;  Awake  Alert oriented x 3. Normal speech and language.eye movements full without nystagmus.fundi were not visualized. Vision acuity and fields appear normal. Hearing is normal. Palatal movements are normal. Face symmetric. Tongue midline. Normal strength, tone, reflexes and coordination. Normal sensation. Gait deferred. ASSESSMENT/PLAN Brandon Hardy is a 81 y.o. male with history of thrombocytopenia, GI bleed, stroke in August 2014 and PFO presenting with Recurrent episodes of slurred speech and left-sided weakness. He did not receive IV t-PA due to rapidly improving deficits.   R brain TIA  MRI  No acute stroke  MRA  Unremarkable   Carotid Doppler  pending   2D Echo  pending   LDL 42  HgbA1c pending  SCDs for VTE prophylaxis ordered DIET DYS 3 Room service  appropriate?: Yes; Fluid consistency:: Thin  aspirin 81 mg orally every day prior to admission but was on hold at time of admission for planned dental work, now on aspirin 325 mg orally every day. Given bleeding risk, decreased aspirin to 81 mg daily  Ongoing aggressive stroke risk factor management  Therapy recommendations:  HH PT  Disposition:  Home with HH  Other Stroke Risk Factors  Advanced age  Former Cigarette smoker, quit smoking 41 years ago   Hx ETOH use  Hx stroke/TIA  Other Active Problems  Elevated troponin. Cardiology do not suggest anticoagulation given anemia, thrombocytopenia and history of GI bleed.  Community acquired pneumonia  Dysphagia, chronic following prior neck OR  FTT and severe protein calorie malnutrition, Body mass index is 19.72 kg/(m^2).   ETOH cirrholsis  with underlying Chronic thrombocytopenia and Chronic anemia  Hospital day # 1  Rhoderick Moody [facility] See Amion for Pager information 06/23/2014 4:23 PM  I have personally examined this patient, reviewed notes, independently viewed imaging studies, participated in medical decision making and plan of care. I have made any additions or clarifications directly to the above note. Agree with note above. He presented with transient episodes of speech difficulty and left-sided weakness likely right brain TIA is. He remains at risk for neurological worsening, recurrent stroke, TIAs and needs ongoing stroke evaluation and aggressive risk factor modification. There is questionable history of paroxysmal atrial fibrillation but patient appears to be high risk for anticoagulation given history of recent GI bleed, recurrent falls and medication noncompliance and recommend only aspirin 81 mg daily Delia Heady, MD Medical Director [facility] Pager: [phone] 06/23/2014 4:29 PM    To contact Stroke Continuity provider, please refer to [email or web address]. After hours, contact  General Neurology

## 2014-06-23 NOTE — Progress Notes (Signed)
ANTIBIOTIC CONSULT NOTE - INITIAL  Pharmacy Consult for Ceftriaxone and Azithromycin Indication: CAP  Allergies  Allergen Reactions  . Chlorhexidine Rash    Patient Measurements: Height: 5\' 6"  (167.6 cm) Weight: 122 lb 1.6 oz (55.384 kg) IBW/kg (Calculated) : 63.8  Vital Signs: Temp: 98.2 F (36.8 C) (06/21 0157) Temp Source: Temporal (06/21 0110) BP: 107/48 mmHg (06/21 0157) Pulse Rate: 49 (06/21 0157) Intake/Output from previous day: 06/20 0701 - 06/21 0700 In: 1000 [I.V.:1000] Out: 300 [Urine:300] Intake/Output from this shift: Total I/O In: 1000 [I.V.:1000] Out: 300 [Urine:300]  Labs:  Recent Labs  06/22/14 2114 06/22/14 2122  WBC 7.0  --   HGB 8.5* 9.2*  PLT 90*  --   CREATININE 1.14 1.00   Estimated Creatinine Clearance: 43.1 mL/min (by C-G formula based on Cr of 1). No results for input(s): VANCOTROUGH, VANCOPEAK, VANCORANDOM, GENTTROUGH, GENTPEAK, GENTRANDOM, TOBRATROUGH, TOBRAPEAK, TOBRARND, AMIKACINPEAK, AMIKACINTROU, AMIKACIN in the last 72 hours.   Microbiology: No results found for this or any previous visit (from the past 720 hour(s)).  Medical History: Past Medical History  Diagnosis Date  . Arthritis   . Blood transfusion   . Renal disorder   . Thrombocytopenia   . Heart murmur   . Chronic back pain   . Peripheral edema     was on Lasix but has been off for about 69months  . GERD (gastroesophageal reflux disease)     takes Prilosec daily  . GI bleed     hx of   . Constipation     takes Senna daily and eats Metamucil daily  . Urinary frequency   . History of kidney stones   . History of blood transfusion     no abnormal reaction noted  . Cataracts, bilateral     immature  . History of blood clots     left leg  . History of Clostridium difficile infection   . BPH (benign prostatic hyperplasia)   . Stroke 08/22/2012  . Esophageal varices with bleeding approx 20 years ago    per pt history  . Thrombocytopenia, unspecified  08/28/2012  . PFO (patent foramen ovale) 08/28/2012    Per TEE 08/2012    Medications:  Prescriptions prior to admission  Medication Sig Dispense Refill Last Dose  . aspirin EC 81 MG EC tablet Take 1 tablet (81 mg total) by mouth daily.   Unknown  . furosemide (LASIX) 20 MG tablet Take 20 mg by mouth as needed for fluid.    unknown  . gabapentin (NEURONTIN) 100 MG capsule Take 100 mg by mouth 2 (two) times daily.    unknown  . omeprazole (PRILOSEC) 20 MG capsule Take 20 mg by mouth daily.   unknown  . riboflavin (VITAMIN B-2) 100 MG TABS tablet Take 100 mg by mouth daily.   unknown  . senna-docusate (SENOKOT-S) 8.6-50 MG per tablet Take 1 tablet by mouth 2 (two) times daily.   unknown  . tamsulosin (FLOMAX) 0.4 MG CAPS capsule Take 0.4 mg by mouth daily.   unknown   Assessment: 81 y.o. male presents with generalized weakness. To start Azithromycin and Rocephin for CAP. Afeb. WBC wnl.  Goal of Therapy:  Resolution of infection  Plan:  Ceftriaxone 1gm IV q24h Azithromycin 500mg  IV q24h Pharmacy will sign off - please reconsult if needed  Christoper Fabian, PharmD, BCPS Clinical pharmacist, pager [phone] 06/23/2014,3:22 AM

## 2014-06-23 NOTE — Progress Notes (Signed)
CRITICAL VALUE ALERT  Critical value received:  Troponin 5.62  Date of notification:  06/21  Time of notification:  07:20  Critical value read back:Yes.    Nurse who received alert:  Glade Lloyd, RN  MD notified (1st page):  Dr. Barnett Abu  Time of first page:  07:21

## 2014-06-23 NOTE — ED Notes (Signed)
Cardiology paged per request on patient's arrival to ED room

## 2014-06-23 NOTE — Progress Notes (Signed)
2 mg Morphine IV ordered for pt 10/10 left leg pain. This RN gave 1 mg IV and wasted 1 mg with Trula Ore, Charity fundraiser. Pt HR 47-54. Patient states this dose relieved his pain.

## 2014-06-23 NOTE — Progress Notes (Signed)
Blood Cultures were drawn before Rocephin and Azithromycin given.

## 2014-06-23 NOTE — ED Notes (Signed)
Bilateral LE +2 pitting edema noted; pt reports "I hadn't took that lasix in 5 or 36months. I started feeling better so I stopped taking it"

## 2014-06-23 NOTE — Progress Notes (Signed)
Speech Language Pathology Treatment: Dysphagia  Patient Details Name: Brandon Hardy MRN: [id number] DOB: 02-11-1927 Today's Date: 06/23/2014 Time: 3267-1245 SLP Time Calculation (min) (ACUTE ONLY): 17 min  Assessment / Plan / Recommendation Clinical Impression  Returned after initial session to reinforce precautions after further chart review. SLP provided rationale for precautions (suspected aspiration pna, wet vocal quality, delayed cough). Pt understanding and willing to proceed with compensatory strategies. Pt was able to complete preventative throat clear and second swallow after each sip with no further reflexive coughing observed. Will continue to follow to reinforce precautions.    HPI Other Pertinent Information: Brandon Hardy is an 81 y.o. male with a history of thrombocytopenia, alcohol abuse, esophageal varices, GI bleed, ACDF C3-C7, stroke in August 2014 and PFO, presenting with recurrent episodes of slurred speech and left-sided weakness over the past 2 days. Neurologist reports recurrent TIAs, likely involving right subcortical MCA territory. CT/MRI negative.    Pertinent Vitals    SLP Plan  Continue with current plan of care    Recommendations Diet recommendations: Thin liquid;Dysphagia 3 (mechanical soft) Liquids provided via: Straw;Cup Medication Administration: Whole meds with liquid Supervision: Patient able to self feed Compensations: Slow rate;Small sips/bites;Multiple dry swallows after each bite/sip;Clear throat intermittently              Oral Care Recommendations: Oral care BID Plan: Continue with current plan of care    GO     Mekaila Tarnow, [name] 06/23/2014, 11:56 AM

## 2014-06-23 NOTE — ED Notes (Signed)
Per pt request, RN contacted pt's son Rocky Link via phone and relayed information that pt had been transferred to 3W-2.

## 2014-06-23 NOTE — Progress Notes (Signed)
Echocardiogram 2D Echocardiogram has been performed.  Brandon Hardy 06/23/2014, 12:26 PM

## 2014-06-23 NOTE — Progress Notes (Signed)
Patient: Brandon Hardy / Admit Date: 06/22/2014 / Date of Encounter: 06/23/2014, 8:11 AM   Subjective: No CP or SOB. C/o leg pains. Otherwise he says he doesn't think there's anything wrong with him.   Objective: Telemetry: sinus bradycardia with occasional PACs and brief periods of irregularity where it is difficult to ascertain P waves. One 19-sec run of WCT, possibly VT. Physical Exam: Blood pressure 95/51, pulse 53, temperature 98.1 F (36.7 C), temperature source Temporal, resp. rate 19, height 5\' 6"  (1.676 m), weight 122 lb 1.6 oz (55.384 kg), SpO2 92 %. General: Thin WM in no acute distress. Head: Normocephalic, atraumatic, sclera non-icteric, no xanthomas, nares are without discharge. Neck: JVP not elevated. Lungs: Clear bilaterally to auscultation without wheezes, rales, or rhonchi. Breathing is unlabored. Heart: RRR S1 S2, 2/6 SEM at RUSB, no rubs or gallops.  Abdomen: Soft, non-tender, non-distended with normoactive bowel sounds. No rebound/guarding. Extremities: No clubbing or cyanosis. 1+ pale soft BLE edema. Distal pedal pulses are 2+ and equal bilaterally. Neuro: Alert and oriented X 3. Moves all extremities spontaneously. Psych:  Responds to questions appropriately with a normal affect.   Intake/Output Summary (Last 24 hours) at 06/23/14 0811 Last data filed at 06/23/14 0138  Gross per 24 hour  Intake   1000 ml  Output    300 ml  Net    700 ml    Inpatient Medications:  . antiseptic oral rinse  7 mL Mouth Rinse BID  . aspirin  300 mg Rectal Daily   Or  . aspirin  325 mg Oral Daily  . azithromycin  500 mg Intravenous Q24H  . cefTRIAXone (ROCEPHIN)  IV  1 g Intravenous Q24H  . gabapentin  100 mg Oral BID  . pantoprazole  40 mg Oral Daily   Infusions:    Labs:  Recent Labs  06/22/14 2114 06/22/14 2122 06/23/14 0515  NA 135 138 136  K 4.8 4.8 3.6  CL 105 102 109  CO2 22  --  23  GLUCOSE 97 93 93  BUN 14 18 13   CREATININE 1.14 1.00 0.90  CALCIUM  7.8*  --  7.6*    Recent Labs  06/22/14 2114 06/23/14 0515  AST 44* 60*  ALT 12* 11*  ALKPHOS 59 58  BILITOT 1.2 1.2  PROT 5.6* 5.2*  ALBUMIN 2.2* 2.0*    Recent Labs  06/22/14 2114 06/22/14 2122 06/23/14 0515  WBC 7.0  --  7.0  NEUTROABS 5.7  --  5.5  HGB 8.5* 9.2* 8.8*  HCT 25.4* 27.0* 26.6*  MCV 98.1  --  98.2  PLT 90*  --  99*    Recent Labs  06/22/14 2302 06/23/14 0515  CKTOTAL 301  --   TROPONINI 0.93* 5.62*   Invalid input(s): POCBNP No results for input(s): HGBA1C in the last 72 hours.   Radiology/Studies:  Dg Chest 1 View  06/22/2014   CLINICAL DATA:  Fall from chair with chest pain and pelvic pain, initial encounter  EXAM: CHEST  1 VIEW  COMPARISON:  06/09/2013  FINDINGS: Cardiac shadow is mildly enlarged but stable. The lungs are well aerated bilaterally. Stable interstitial changes are noted bilaterally. No focal infiltrate or sizable effusion is seen. No acute bony abnormality is noted.  IMPRESSION: No acute abnormality noted.   Electronically Signed   By: Alcide Clever M.D.   On: 06/22/2014 20:52   Dg Pelvis 1-2 Views  06/22/2014   CLINICAL DATA:  Fall from chair with pelvic pain  EXAM:  PELVIS - 1-2 VIEW  COMPARISON:  07/30/2011  FINDINGS: Diffuse osteopenia is again identified. Postsurgical changes are noted in the lower lumbar spine. Degenerative changes of the hip joints are noted bilaterally. No acute fracture or dislocation is seen.  IMPRESSION: Diffuse osteopenia.  No acute bony abnormality is noted.   Electronically Signed   By: Alcide Clever M.D.   On: 06/22/2014 21:00   Ct Head Wo Contrast  06/22/2014   CLINICAL DATA:  Slid from chair, slurred speech  EXAM: CT HEAD WITHOUT CONTRAST  CT CERVICAL SPINE WITHOUT CONTRAST  TECHNIQUE: Multidetector CT imaging of the head and cervical spine was performed following the standard protocol without intravenous contrast. Multiplanar CT image reconstructions of the cervical spine were also generated.  COMPARISON:   08/23/2012  FINDINGS: CT HEAD FINDINGS  Bony calvarium is intact. No gross soft tissue abnormality is noted. Mild atrophic changes and chronic white matter ischemic change is seen. Changes of prior right middle cerebral artery infarct are noted. These are new from the prior examination. No acute hemorrhage, acute infarction or space-occupying mass lesion is identified.  CT CERVICAL SPINE FINDINGS  Seven cervical segments are well visualized. Interbody fusion is noted from C3-C7 with anterior fixation. Posterior laminectomy is noted at C5. No acute fracture or acute facet abnormality is noted. Multilevel osteophytic changes and facet hypertrophic changes are noted.  IMPRESSION: CT of the head: Chronic white matter ischemic change and atrophic change. Prior right middle cerebral artery infarct is noted  CT of the cervical spine: Postsurgical changes from C3-C7. Multilevel degenerative changes are noted. No acute abnormality is seen.   Electronically Signed   By: Alcide Clever M.D.   On: 06/22/2014 21:23   Ct Angio Chest Pe W/cm &/or Wo Cm  06/23/2014   CLINICAL DATA:  Patient with lower extremity pitting edema. History of heart murmur and left lower extremity blood clots.  EXAM: CT ANGIOGRAPHY CHEST WITH CONTRAST  TECHNIQUE: Multidetector CT imaging of the chest was performed using the standard protocol during bolus administration of intravenous contrast. Multiplanar CT image reconstructions and MIPs were obtained to evaluate the vascular anatomy.  CONTRAST:  80mL OMNIPAQUE IOHEXOL 350 MG/ML SOLN  COMPARISON:  Chest radiograph 06/22/2014  FINDINGS: Mediastinum/Nodes: No enlarged axillary, mediastinal or hilar lymphadenopathy. No pericardial effusion. Coronary arterial vascular calcifications. Visualized thyroid is unremarkable.  Adequate opacification of the main pulmonary artery for the detection of pulmonary embolism. Motion artifact limits evaluation of the distal pulmonary arteries. No evidence for pulmonary  embolism.  Lungs/Pleura: Central airways are patent. Dependent ground-glass and consolidative opacities within the right lower lobe. No pleural effusion or pneumothorax. Tree-in-bud ground-glass and nodular opacities within the anterior left upper lobe (image 54; series 6).  Upper abdomen: Nonobstructing 3 mm stone superior pole right kidney. The gallbladder appears enlarged however is incompletely visualized.  Musculoskeletal: Mid thoracic spine degenerative changes without aggressive or acute appearing osseous lesions.  Review of the MIP images confirms the above findings.  IMPRESSION: No evidence for pulmonary embolism. Evaluation limited within the distal pulmonary arteries particularly within the lower lobes to motion artifact.  Consolidative opacity within subpleural right lobe may represent pneumonia in the appropriate clinical setting. Additionally there are scattered tree-in-bud nodular opacities within the anterior left upper lobe, likely infectious or inflammatory etiology. Recommend continued radiographic followup to ensure resolution.   Electronically Signed   By: Annia Belt M.D.   On: 06/23/2014 02:06   Ct Cervical Spine Wo Contrast  06/22/2014   CLINICAL DATA:  Slid from chair, slurred speech  EXAM: CT HEAD WITHOUT CONTRAST  CT CERVICAL SPINE WITHOUT CONTRAST  TECHNIQUE: Multidetector CT imaging of the head and cervical spine was performed following the standard protocol without intravenous contrast. Multiplanar CT image reconstructions of the cervical spine were also generated.  COMPARISON:  08/23/2012  FINDINGS: CT HEAD FINDINGS  Bony calvarium is intact. No gross soft tissue abnormality is noted. Mild atrophic changes and chronic white matter ischemic change is seen. Changes of prior right middle cerebral artery infarct are noted. These are new from the prior examination. No acute hemorrhage, acute infarction or space-occupying mass lesion is identified.  CT CERVICAL SPINE FINDINGS  Seven  cervical segments are well visualized. Interbody fusion is noted from C3-C7 with anterior fixation. Posterior laminectomy is noted at C5. No acute fracture or acute facet abnormality is noted. Multilevel osteophytic changes and facet hypertrophic changes are noted.  IMPRESSION: CT of the head: Chronic white matter ischemic change and atrophic change. Prior right middle cerebral artery infarct is noted  CT of the cervical spine: Postsurgical changes from C3-C7. Multilevel degenerative changes are noted. No acute abnormality is seen.   Electronically Signed   By: Alcide Clever M.D.   On: 06/22/2014 21:23   Mr Brain Wo Contrast  06/23/2014   CLINICAL DATA:  Slurred speech and generalized weakness. Three-week history of diarrhea, recent 40 pound weight loss. Sepsis. History of thrombocytopenia and stroke.  EXAM: MRI HEAD WITHOUT CONTRAST  MRA HEAD WITHOUT CONTRAST  TECHNIQUE: Multiplanar, multiecho pulse sequences of the brain and surrounding structures were obtained without intravenous contrast. Angiographic images of the head were obtained using MRA technique without contrast.  COMPARISON:  CT of the head June 22, 2014 at 2102 hours and MRI/MRA of the head August 23, 2012  FINDINGS: MRI HEAD FINDINGS  No reduced diffusion to suggest acute ischemia. Chronic microhemorrhage LEFT thalamus without new susceptibility artifact to suggest hemorrhage. RIGHT frontotemporal parietal encephalomalacia. Tiny bold bilateral small cerebellar infarcts. Mild ex vacuo dilatation the RIGHT atrium, ventricles and sulci are otherwise normal for patient's age. Patchy to confluent supratentorial and pontine white matter T2 hyperintensities, similar to prior MRI without midline shift, mass effect or mass lesions.  No abnormal extra-axial fluid collections. Ocular globes and orbital contents are unremarkable. Mild ethmoid mucosal thickening without paranasal sinus air-fluid levels. Mild LEFT mastoid effusion. Under pneumatized RIGHT mastoid  air cells. No abnormal sellar expansion. No cerebellar tonsillar ectopia. Patient is edentulous. Status post C3-4 suspected ACDF.  MRA HEAD FINDINGS  Anterior circulation: Normal flow related enhancement of the included cervical, petrous, cavernous and supra clinoid internal carotid arteries. Patent anterior communicating artery. Normal flow related enhancement of the anterior and middle cerebral arteries, including more distal segments. RIGHT M3 segment is widely patent, consistent with re- cannulization.  No large vessel occlusion, high-grade stenosis, abnormal luminal irregularity, aneurysm.  Posterior circulation: LEFT vertebral artery is dominant. Basilar artery is patent, with normal flow related enhancement of the main branch vessels. Tiny RIGHT posterior communicating artery is present. Normal flow related enhancement of the posterior cerebral arteries.  No large vessel occlusion, high-grade stenosis, abnormal luminal irregularity, aneurysm.  IMPRESSION: MRI HEAD: No acute intracranial process, specifically no acute ischemia.  Remote large RIGHT middle cerebral artery territory infarct. Multiple small old cerebellar infarcts.  Moderate white matter changes compatible with chronic small vessel ischemic disease .  MRA HEAD: No large vessel occlusion or hemodynamically significant stenosis.   Electronically Signed   By: Michel Santee.D.  On: 06/23/2014 01:11   Mr Maxine Glenn Head/brain Wo Cm  06/23/2014   CLINICAL DATA:  Slurred speech and generalized weakness. Three-week history of diarrhea, recent 40 pound weight loss. Sepsis. History of thrombocytopenia and stroke.  EXAM: MRI HEAD WITHOUT CONTRAST  MRA HEAD WITHOUT CONTRAST  TECHNIQUE: Multiplanar, multiecho pulse sequences of the brain and surrounding structures were obtained without intravenous contrast. Angiographic images of the head were obtained using MRA technique without contrast.  COMPARISON:  CT of the head June 22, 2014 at 2102 hours and MRI/MRA  of the head August 23, 2012  FINDINGS: MRI HEAD FINDINGS  No reduced diffusion to suggest acute ischemia. Chronic microhemorrhage LEFT thalamus without new susceptibility artifact to suggest hemorrhage. RIGHT frontotemporal parietal encephalomalacia. Tiny bold bilateral small cerebellar infarcts. Mild ex vacuo dilatation the RIGHT atrium, ventricles and sulci are otherwise normal for patient's age. Patchy to confluent supratentorial and pontine white matter T2 hyperintensities, similar to prior MRI without midline shift, mass effect or mass lesions.  No abnormal extra-axial fluid collections. Ocular globes and orbital contents are unremarkable. Mild ethmoid mucosal thickening without paranasal sinus air-fluid levels. Mild LEFT mastoid effusion. Under pneumatized RIGHT mastoid air cells. No abnormal sellar expansion. No cerebellar tonsillar ectopia. Patient is edentulous. Status post C3-4 suspected ACDF.  MRA HEAD FINDINGS  Anterior circulation: Normal flow related enhancement of the included cervical, petrous, cavernous and supra clinoid internal carotid arteries. Patent anterior communicating artery. Normal flow related enhancement of the anterior and middle cerebral arteries, including more distal segments. RIGHT M3 segment is widely patent, consistent with re- cannulization.  No large vessel occlusion, high-grade stenosis, abnormal luminal irregularity, aneurysm.  Posterior circulation: LEFT vertebral artery is dominant. Basilar artery is patent, with normal flow related enhancement of the main branch vessels. Tiny RIGHT posterior communicating artery is present. Normal flow related enhancement of the posterior cerebral arteries.  No large vessel occlusion, high-grade stenosis, abnormal luminal irregularity, aneurysm.  IMPRESSION: MRI HEAD: No acute intracranial process, specifically no acute ischemia.  Remote large RIGHT middle cerebral artery territory infarct. Multiple small old cerebellar infarcts.  Moderate  white matter changes compatible with chronic small vessel ischemic disease .  MRA HEAD: No large vessel occlusion or hemodynamically significant stenosis.   Electronically Signed   By: Awilda Metro M.D.   On: 06/23/2014 01:11     Assessment and Plan  64M with history of alcohol abuse, esophageal varices, h/o GIB, small PFO, stroke in 2014, chronic thrombocytopenia, chronic leg swelling presented 06/23/14 with slurred speech, left facial droop, dragging of right leg, generalized weakness, and falls. Recently went off ASA for a dental procedure. CT/MRI without new strokes. Workup also notable for troponin of 5.6, INR ? 1.6, and progressive weight loss. CTA neg for PE. Bedside LE duplex 6/21 negative for DVT.  1. Probable recurrent TIAs - occurred while being off aspirin. Workup per neuro. Infectious workup also underway.  2. Elevated troponin - patient has had no anginal symptoms whatsoever. Etiology of + trop is not totally clear. CTA neg for PE. May represent underlying vascular disease in the setting of neurologic event +/- hypotension. Given his age, comorbidities, lack of any cardiac symptoms, suspect that conservative management is most appropriate. Will review with MD. 2D echo pending - also has a systolic murmur.  3. Irregular heartbeat - initially called chronic AF in consult note but prior EKGs show NSR/SB. On initial EKG he does appear to have suggestion of PAF, but f/u EKG showed suggestion of some P wave  activity. Upon reviewing telemetry this morning it appears he's actually had sinus bradycardia with occasional PACs and brief periods of irregularity. The irregularity may represent brief paroxysms of afib but in light of his multiple bleeding risk factors he does not appear to be a candidate for anticoagulation beyond aspirin. Rates are controlled and he is not symptomatic.  4. Modest hypotension - Infectious workup underway. May also be due in part to poor oral intake. Lasix on hold.    5. WCT ? VT - per nurse, not yet taking oral meds as he has not yet had formal swallow eval. Will give 3 runs of IV KCl since K was 3.6. Check Mg, TSH. Echo pending. Asymptomatic at the time. Repeat EKG for QTc prolongation this AM. Follow.  6. Multitude of medical issues including the above, as well as anemia, coagulopathy, hypoalbuminemia - per IM. Chronic LEE is likely due to low albumin state.  Signed, Ronie Spies PA-C Pager: [phone]

## 2014-06-23 NOTE — Progress Notes (Addendum)
Initial Nutrition Assessment  DOCUMENTATION CODES:  Severe malnutrition in context of chronic illness  INTERVENTION:  Ensure Enlive po TID, each supplement provides 350 kcal and 20 grams of protein  NUTRITION DIAGNOSIS:  Increased nutrient needs related to wound healing, chronic illness as evidenced by estimated needs  GOAL:  Patient will meet greater than or equal to 90% of their needs  MONITOR:  PO intake, Supplement acceptance, Labs, Weight trends, Skin, I & O's  REASON FOR ASSESSMENT:  Consult Assessment of nutrition requirement/status  ASSESSMENT: 81 y.o. male with PMH of chronic thrombocytopenia, chronic leg swelling, GERD, PFO, history of GI bleed, history of alcohol abuse, documented history of esophageal varices, multiple back surgery with chronic leg weakness; presented with slurred speech and generalized weakness.  Patient eating lunch upon RD visit.  Reports he had trouble eating PTA due to abscesses in his mouth.  S/p bedside swallow evaluation today -- SLP recommending Dys 3, thin liquid diet.    Patient endorses weight loss, however, unable to quantify amount.  Likes chocolate Ensure/Boost supplements.  Noted pt with several wounds (buttocks/sacrum area).  Nutrition-Focused physical exam completed. Findings are severe fat depletion, severe muscle depletion, and no edema.   Height:  Ht Readings from Last 1 Encounters:  06/23/14 5\' 6"  (1.676 m)    Weight:  Wt Readings from Last 1 Encounters:  06/23/14 122 lb 1.6 oz (55.384 kg)    Ideal Body Weight:  64.5 kg  Wt Readings from Last 10 Encounters:  06/23/14 122 lb 1.6 oz (55.384 kg)  09/23/12 146 lb 6.4 oz (66.407 kg)  08/28/12 138 lb 11.2 oz (62.914 kg)  08/27/12 135 lb 11.2 oz (61.553 kg)  05/09/12 142 lb (64.411 kg)  04/17/12 148 lb (67.132 kg)  04/12/12 150 lb (68.04 kg)  01/04/12 148 lb 12.8 oz (67.495 kg)  09/19/11 139 lb 12.8 oz (63.413 kg)    BMI:  Body mass index is 19.72  kg/(m^2).  Estimated Nutritional Needs:  Kcal:  1600-1800  Protein:  80-90 gm  Fluid:  1.6-1.8 L  Skin:  Wound (see comment) (Stage II to L buttock, Stage I to R buttock/sacrum)  Diet Order:  DIET DYS 3 Room service appropriate?: Yes; Fluid consistency:: Thin  EDUCATION NEEDS:  No education needs identified at this time   Intake/Output Summary (Last 24 hours) at 06/23/14 1552 Last data filed at 06/23/14 0938  Gross per 24 hour  Intake   1000 ml  Output    700 ml  Net    300 ml    Last BM:  6/21  Maureen Chatters, RD, LDN Pager #: [phone] After-Hours Pager #: [phone]

## 2014-06-23 NOTE — ED Notes (Signed)
Pt refusing in and out cath; condom cath placed

## 2014-06-24 ENCOUNTER — Observation Stay (HOSPITAL_BASED_OUTPATIENT_CLINIC_OR_DEPARTMENT_OTHER): Payer: PPO

## 2014-06-24 DIAGNOSIS — I429 Cardiomyopathy, unspecified: Secondary | ICD-10-CM

## 2014-06-24 DIAGNOSIS — G459 Transient cerebral ischemic attack, unspecified: Secondary | ICD-10-CM

## 2014-06-24 DIAGNOSIS — E43 Unspecified severe protein-calorie malnutrition: Secondary | ICD-10-CM | POA: Diagnosis not present

## 2014-06-24 DIAGNOSIS — I639 Cerebral infarction, unspecified: Secondary | ICD-10-CM | POA: Diagnosis not present

## 2014-06-24 LAB — BASIC METABOLIC PANEL
ANION GAP: 4 — AB (ref 5–15)
BUN: 13 mg/dL (ref 6–20)
CO2: 25 mmol/L (ref 22–32)
CREATININE: 0.9 mg/dL (ref 0.61–1.24)
Calcium: 8 mg/dL — ABNORMAL LOW (ref 8.9–10.3)
Chloride: 111 mmol/L (ref 101–111)
GFR calc Af Amer: >60 mL/min (ref >60)
GFR calc non Af Amer: >60 mL/min (ref >60)
Glucose, Bld: 87 mg/dL (ref 65–99)
POTASSIUM: 3.7 mmol/L (ref 3.5–5.1)
Sodium: 140 mmol/L (ref 135–145)

## 2014-06-24 LAB — GLUCOSE, CAPILLARY
GLUCOSE-CAPILLARY: 103 mg/dL — AB (ref 65–99)
GLUCOSE-CAPILLARY: 114 mg/dL — AB (ref 65–99)
GLUCOSE-CAPILLARY: 118 mg/dL — AB (ref 65–99)

## 2014-06-24 LAB — CBC
HCT: 26.4 % — ABNORMAL LOW (ref 39.0–52.0)
Hemoglobin: 8.7 g/dL — ABNORMAL LOW (ref 13.0–17.0)
MCH: 32.5 pg (ref 26.0–34.0)
MCHC: 33 g/dL (ref 30.0–36.0)
MCV: 98.5 fL (ref 78.0–100.0)
PLATELETS: 100 10*3/uL — AB (ref 150–400)
RBC: 2.68 MIL/uL — ABNORMAL LOW (ref 4.22–5.81)
RDW: 13.8 % (ref 11.5–15.5)
WBC: 6.3 10*3/uL (ref 4.0–10.5)

## 2014-06-24 LAB — HEMOGLOBIN A1C
Hgb A1c MFr Bld: 5 % (ref 4.8–5.6)
Mean Plasma Glucose: 97 mg/dL

## 2014-06-24 LAB — TROPONIN I
TROPONIN I: 3.8 ng/mL — AB (ref <0.031)
Troponin I: 2.86 ng/mL (ref <0.031)
Troponin I: 2.59 ng/mL (ref <0.031)

## 2014-06-24 MED ORDER — CEFUROXIME AXETIL 500 MG PO TABS: 500.0000 mg | ORAL_TABLET | Freq: Two times a day (BID) | ORAL | Status: DC

## 2014-06-24 MED ORDER — AZITHROMYCIN 250 MG PO TABS: 250.0000 mg | ORAL_TABLET | Freq: Every day | ORAL | Status: DC

## 2014-06-24 MED ORDER — CEFUROXIME AXETIL 500 MG PO TABS
500.0000 mg | ORAL_TABLET | Freq: Two times a day (BID) | ORAL | Status: DC
  Filled 2014-06-24: qty 1

## 2014-06-24 NOTE — Progress Notes (Signed)
STROKE TEAM PROGRESS NOTE   HISTORY Brandon Hardy is an 81 y.o. male with a history of thrombocytopenia, GI bleed, stroke in August 2014 and PFO, presenting with recurrent episodes of slurred speech and left-sided weakness over the past 2 days. His son noticed slurred speech dragging of his right leg yesterday and wanted to bring patient to the ER but patient was not agreeable. He developed recurrent slurred speech as well as left facial droop and left sided weakness again sometime earlier today. Onset is unclear. By the time he arrived in the emergency room his speech had improved as had left-sided weakness. He was back to baseline at the time of this evaluation. NIH stroke score was 1, as patient had some difficulty remembering the current month. His been taking aspirin 81 mg per day up until about 3 weeks ago when he stopped it, in anticipation of the dental procedure. CT scan of his head showed old right MCA territory stroke as well as as bilateral small vessel white matter ischemic changes. There were no acute findings. He was LSN: 06/22/2014, early morning, time unclear. Patient was not administered TPA secondary to rapidly improved deficits. He was admitted for further evaluation and treatment.   SUBJECTIVE (INTERVAL HISTORY) His wish is to go home today.  Overall he feels his condition is rapidly improving.    OBJECTIVE Temp:  [98.1 F (36.7 C)-98.4 F (36.9 C)] 98.3 F (36.8 C) (06/22 1143) Pulse Rate:  [29-67] 67 (06/22 1143) Cardiac Rhythm:  [-] Atrial fibrillation (06/22 0839) Resp:  [13-18] 17 (06/22 0220) BP: (92-135)/(42-88) 105/88 mmHg (06/22 1143) SpO2:  [94 %-99 %] 94 % (06/22 0215) Weight:  [121 lb 14.4 oz (55.293 kg)] 121 lb 14.4 oz (55.293 kg) (06/22 0513)   Recent Labs Lab 06/23/14 1130 06/23/14 1710 06/23/14 2340 06/24/14 0554 06/24/14 1138  GLUCAP 99 114* 114* 103* 118*    Recent Labs Lab 06/22/14 2114 06/22/14 2122 06/23/14 0515 06/23/14 1140  06/24/14 0810  NA 135 138 136  --  140  K 4.8 4.8 3.6  --  3.7  CL 105 102 109  --  111  CO2 22  --  23  --  25  GLUCOSE 97 93 93  --  87  BUN --  13  CREATININE 1.14 1.00 0.90  --  0.90  CALCIUM 7.8*  --  7.6*  --  8.0*  MG  --   --   --  1.6*  --     Recent Labs Lab 06/22/14 2114 06/23/14 0515  AST 44* 60*  ALT 12* 11*  ALKPHOS 59 58  BILITOT 1.2 1.2  PROT 5.6* 5.2*  ALBUMIN 2.2* 2.0*    Recent Labs Lab 06/22/14 2114 06/22/14 2122 06/23/14 0515 06/24/14 0810  WBC 7.0  --  7.0 6.3  NEUTROABS 5.7  --  5.5  --   HGB 8.5* 9.2* 8.8* 8.7*  HCT 25.4* 27.0* 26.6* 26.4*  MCV 98.1  --  98.2 98.5  PLT 90*  --  99* 100*    Recent Labs Lab 06/22/14 2302  06/23/14 1140 06/23/14 1734 06/23/14 2307 06/24/14 0810 06/24/14 1100  CKTOTAL 301  --   --   --   --   --   --   TROPONINI 0.93*  < > 4.63* 3.75* 3.80* 2.86* 2.59*  < > = values in this interval not displayed.  Recent Labs  06/22/14 2114 06/23/14 0515  LABPROT 18.5* 19.2*  INR 1.54*  1.62*    Recent Labs  06/23/14 0119  COLORURINE YELLOW  LABSPEC 1.012  PHURINE 5.5  GLUCOSEU NEGATIVE  HGBUR SMALL*  BILIRUBINUR NEGATIVE  KETONESUR NEGATIVE  PROTEINUR NEGATIVE  UROBILINOGEN 1.0  NITRITE NEGATIVE  LEUKOCYTESUR NEGATIVE       Component Value Date/Time   CHOL 76 06/23/2014 0515   TRIG 41 06/23/2014 0515   HDL 26* 06/23/2014 0515   CHOLHDL 2.9 06/23/2014 0515   VLDL 8 06/23/2014 0515   LDLCALC 42 06/23/2014 0515   Lab Results  Component Value Date   HGBA1C 5.0 06/23/2014      Component Value Date/Time   LABOPIA POSITIVE* 06/23/2014 0119   COCAINSCRNUR NONE DETECTED 06/23/2014 0119   LABBENZ NONE DETECTED 06/23/2014 0119   AMPHETMU NONE DETECTED 06/23/2014 0119   THCU NONE DETECTED 06/23/2014 0119   LABBARB NONE DETECTED 06/23/2014 0119     Recent Labs Lab 06/22/14 2114  ETH <5    Dg Chest 1 View 06/22/2014   No acute abnormality noted.     Dg Pelvis 1-2 Views 06/22/2014     Diffuse osteopenia.  No acute bony abnormality is noted.     Ct Head Wo Contrast 06/22/2014   Chronic white matter ischemic change and atrophic change. Prior right middle cerebral artery infarct is noted    Ct Angio Chest Pe W/cm &/or Wo Cm 06/23/2014   No evidence for pulmonary embolism. Evaluation limited within the distal pulmonary arteries particularly within the lower lobes to motion artifact.  Consolidative opacity within subpleural right lobe may represent pneumonia in the appropriate clinical setting. Additionally there are scattered tree-in-bud nodular opacities within the anterior left upper lobe, likely infectious or inflammatory etiology. Recommend continued radiographic followup to ensure resolution.     Ct Cervical Spine Wo Contrast 06/22/2014    Postsurgical changes from C3-C7. Multilevel degenerative changes are noted. No acute abnormality is seen.     MRI HEAD 06/23/2014    No acute intracranial process, specifically no acute ischemia.  Remote large RIGHT middle cerebral artery territory infarct. Multiple small old cerebellar infarcts.  Moderate white matter changes compatible with chronic small vessel ischemic disease .    MRA HEAD  06/23/2014    No large vessel occlusion or hemodynamically significant stenosis.       PHYSICAL EXAM Pleasant frail  elderly caucasian male not in distress. . Afebrile. Head is nontraumatic. Neck is supple without bruit.    Cardiac exam no murmur or gallop. Lungs are clear to auscultation. Distal pulses are well felt. Neurological Exam ;  Awake  Alert oriented x 3. Normal speech and language.eye movements full without nystagmus.fundi were not visualized. Vision acuity and fields appear normal. Hearing is normal. Palatal movements are normal. Face symmetric. Tongue midline. Normal strength, tone, reflexes and coordination. Normal sensation. Gait deferred. ASSESSMENT/PLAN [name] is a 81 y.o. male with history of thrombocytopenia, GI  bleed, stroke in August 2014 and PFO presenting with Recurrent episodes of slurred speech and left-sided weakness. He did not receive IV t-PA due to rapidly improving deficits.   R brain TIA  MRI  No acute stroke  MRA  Unremarkable   Carotid Doppler   Bilateral: 1-39% ICA stenosis. Vertebral artery flow is antegrade  2D Echo Left ventricle: The cavity size was mildly dilated. Systolic function was mildly to moderately reduced. The estimated ejection fraction was in the range of 40% to 45%. There is hypokinesis of the basal-midinferior myocardium    LDL 42  HgbA1c  5.0  SCDs for VTE prophylaxis ordered Diet regular Room service appropriate?: Yes; Fluid consistency:: Thin Diet - low sodium heart healthy  aspirin 81 mg orally every day prior to admission but was on hold at time of admission for planned dental work, now on aspirin 325 mg orally every day. Given bleeding risk, decreased aspirin to 81 mg daily  Ongoing aggressive stroke risk factor management  Therapy recommendations:  HH PT  Disposition:  Home with HH  Other Stroke Risk Factors  Advanced age  Former Cigarette smoker, quit smoking 41 years ago   Hx ETOH use  Hx stroke/TIA  Other Active Problems  Elevated troponin. Cardiology do not suggest anticoagulation given anemia, thrombocytopenia and history of GI bleed.  Community acquired pneumonia  Dysphagia, chronic following prior neck OR  FTT and severe protein calorie malnutrition, Body mass index is 19.68 kg/(m^2).   ETOH cirrholsis with underlying Chronic thrombocytopenia and Chronic anemia  Hospital day # 2  SETHI,PRAMOD  [facility] See Amion for Pager information 06/24/2014 2:41 PM  I have personally examined this patient, reviewed notes, independently viewed imaging studies, participated in medical decision making and plan of care. I have made any additions or clarifications directly to the above note. Agree with note above.  He presented with transient episodes of speech difficulty and left-sided weakness likely right brain TIA is.  Continue aspirin 81 mg daily and aggressive risk factor control. Discharge home later today. Follow-up as an outpatient in the stroke clinic in 2 months or call earlier if necessary. Delia Heady, MD Medical Director [facility] Pager: [phone] 06/24/2014 2:41 PM    To contact Stroke Continuity provider, please refer to [email or web address]. After hours, contact General Neurology

## 2014-06-24 NOTE — Progress Notes (Signed)
Occupational Therapy Evaluation Addendum:    July 09, 2014 1200  OT G-codes **NOT FOR INPATIENT CLASS**  Functional Limitation Self care  Self Care Current Status (J4492) CI  Self Care Goal Status (E1007) CI  Self Care Discharge Status [phone]) CI  Jeani Hawking, OTR/L [phone]

## 2014-06-24 NOTE — Progress Notes (Signed)
Speech Language Pathology Treatment: Dysphagia  Patient Details Name: Brandon Hardy MRN: [id number] DOB: 12/02/1927 Today's Date: 06/24/2014 Time: 8938-1017 SLP Time Calculation (min) (ACUTE ONLY): 30 min  Assessment / Plan / Recommendation Clinical Impression  Pt demonstrates ongoing wet vocal quality following sips. Pt is making effort to follow throat clear, swallow again strategy and seems to understand rationale well. Pt was able to return demonstrate over several trials with am meal. Pt also concerned about solids; states he prefers soft foods but would like a choice of foods. He makes appropriate choices about texture independently. Will upgrade to regular texture diet and thin liquids with ongoing precautions. Hopeful that this strategy will reduce aspiration risk but preserve pts quality of life without significant diet modifications   HPI     Pertinent Vitals    SLP Plan  Continue with current plan of care    Recommendations Diet recommendations: Regular;Thin liquid Liquids provided via: Straw;Cup Medication Administration: Whole meds with liquid Supervision: Patient able to self feed Compensations: Slow rate;Small sips/bites;Multiple dry swallows after each bite/sip;Clear throat intermittently              Plan: Continue with current plan of care    GO    [facility], MA CCC-SLP [phone]  Claudine Mouton 06/24/2014, 9:58 AM

## 2014-06-24 NOTE — Evaluation (Signed)
Occupational Therapy Evaluation Patient Details Name: Brandon Hardy MRN: [id number] DOB: 25-Mar-1927 Today's Date: 06/24/2014    History of Present Illness ADm with slurred speech and Lt weakness; MRI brain negative; elevated trop I (per cardiology likely demand ischemia--not an anticoagulation candidate with h/o falls), negative for PE or DVT  PMHx- Large Rt MCA CVA, bil cerebellar infarcts, neck and back surgery   Clinical Impression   Pt admitted with above.  He presents to OT with generalized weakness, but reports he is back to his baseline.  Currently, he is able to perform ADLs with supervision. No further OT needs identified.  He would like a rollator for home use.     Follow Up Recommendations  Supervision/Assistance - 24 hour;No OT follow up    Equipment Recommendations  Other (comment) (Pt would like a rollator )    Recommendations for Other Services       Precautions / Restrictions Precautions Precautions: Fall Restrictions Weight Bearing Restrictions: No      Mobility Bed Mobility Overal bed mobility: Modified Independent             General bed mobility comments: requires increased time   Transfers Overall transfer level: Needs assistance Equipment used: Rolling walker (2 wheeled) Transfers: Sit to/from [employer] Sit to Stand: Supervision Stand pivot transfers: Supervision       General transfer comment: verbal cues for hand placement.  Pt states "I know what to do.  Doesn't mean I am going to do it"    Balance Overall balance assessment: Needs assistance Sitting-balance support: Feet supported Sitting balance-Leahy Scale: Good     Standing balance support: Single extremity supported Standing balance-Leahy Scale: Poor Standing balance comment: reliant on UE support                             ADL Overall ADL's : Needs assistance/impaired Eating/Feeding: Independent;Sitting   Grooming: Wash/dry hands;Wash/dry  face;Oral care;Supervision/safety;Standing   Upper Body Bathing: Set up;Sitting   Lower Body Bathing: Supervison/ safety;Sit to/from stand   Upper Body Dressing : Set up;Sitting   Lower Body Dressing: Supervision/safety;Sit to/from stand   Toilet Transfer: Supervision/safety;Ambulation;Comfort height toilet;Regular Toilet;RW   Toileting- Clothing Manipulation and Hygiene: Supervision/safety;Sit to/from stand       Functional mobility during ADLs: Supervision/safety;Rolling walker General ADL Comments: Pt very irritable re: amount of testing and desire to return home.   He feels he is back to his baseline and feels he will be able to manage fine at home.        Vision Vision Assessment?: No apparent visual deficits   Perception     Praxis      Pertinent Vitals/Pain Pain Assessment: No/denies pain     Hand Dominance Right   Extremity/Trunk Assessment Upper Extremity Assessment Upper Extremity Assessment: Generalized weakness   Lower Extremity Assessment Lower Extremity Assessment: Defer to PT evaluation   Cervical / Trunk Assessment Cervical / Trunk Assessment: Kyphotic   Communication Communication Communication: HOH   Cognition Arousal/Alertness: Awake/alert Behavior During Therapy: WFL for tasks assessed/performed Overall Cognitive Status: Within Functional Limits for tasks assessed                     General Comments       Exercises       Shoulder Instructions      Home Living Family/patient expects to be discharged to:: Private residence Living Arrangements: Spouse/significant other Available Help at  Discharge: Family;Available 24 hours/day (wife also uses cane) Type of Home: House Home Access: Stairs to enter [employer] of Steps: 1+1 Entrance Stairs-Rails: None Home Layout: One level     Bathroom Shower/Tub: Producer, television/film/video: Handicapped height Bathroom Accessibility: Yes How Accessible: Accessible via  walker Home Equipment: Walker - 2 wheels;Cane - single point          Prior Functioning/Environment Level of Independence: Independent with assistive device(s)        Comments: h/o multiple falls (last 12/2013 fell off ladder hangning lights); uses cane primarily, walker outside home at times    OT Diagnosis: Generalized weakness   OT Problem List: Decreased strength;Decreased activity tolerance;Impaired balance (sitting and/or standing)   OT Treatment/Interventions:      OT Goals(Current goals can be found in the care plan section) Acute Rehab OT Goals Patient Stated Goal: return home today OT Goal Formulation: All assessment and education complete, DC therapy  OT Frequency:     Barriers to D/C:            Co-evaluation              End of Session Equipment Utilized During Treatment: Rolling walker Nurse Communication: Mobility status  Activity Tolerance: Patient tolerated treatment well Patient left: in bed;with call bell/phone within reach;with bed alarm set;Other (comment) (ultrasound tech presetn )   Time: 9147-8295 OT Time Calculation (min): 77 min Charges:  OT General Charges $OT Visit: 1 Procedure OT Evaluation $Initial OT Evaluation Tier I: 1 Procedure OT Treatments $Self Care/Home Management : 23-37 mins $Therapeutic Activity: 23-37 mins G-Codes:    Kimberely Mccannon M 2014-07-10, 12:46 PM

## 2014-06-24 NOTE — Progress Notes (Signed)
VASCULAR LAB PRELIMINARY  PRELIMINARY  PRELIMINARY  PRELIMINARY  Carotid duplex  completed.    Preliminary report:  Bilateral:  1-39% ICA stenosis.  Vertebral artery flow is antegrade.      Karly Pitter, RVT 06/24/2014, 12:56 PM

## 2014-06-24 NOTE — Care Management Note (Signed)
Case Management Note  Patient Details  Name: KENECHUKWU CONROY MRN: [id number] Date of Birth: [birth year]  Subjective/Objective:      Pt admitted for stroke like symptoms. Pt is from home with wife and plans to return home.               Action/Plan: CM did set up [facility] PT services with [facility]. Referral made to [facility] and SOC to begin within 24-48 hrs post d/c. Pt will need Orders for [facility] PT services. No further needs from CM at this time.    Expected Discharge Date:                  Expected Discharge Plan:  Home w Home Health Services  In-House Referral:     Discharge planning Services  CM Consult  Post Acute Care Choice:  Home Health Choice offered to:  Patient  DME Arranged:    DME Agency:     HH Arranged:  PT HH Agency:  [facility]  Status of Service:  Completed, signed off  Medicare Important Message Given:  No Date Medicare IM Given:    Medicare IM give by:    Date Additional Medicare IM Given:    Additional Medicare Important Message give by:     If discussed at Long Length of Stay Meetings, dates discussed:    Additional Comments:  Gala Lewandowsky, RN 06/24/2014, 10:35 AM

## 2014-06-28 LAB — CULTURE, BLOOD (ROUTINE X 2)
CULTURE: NO GROWTH
Culture: NO GROWTH

## 2014-08-09 ENCOUNTER — Encounter (HOSPITAL_COMMUNITY): Payer: Self-pay | Admitting: Emergency Medicine

## 2014-08-09 ENCOUNTER — Emergency Department (HOSPITAL_COMMUNITY): Payer: PPO

## 2014-08-09 ENCOUNTER — Emergency Department (HOSPITAL_COMMUNITY)
Admission: EM | Admit: 2014-08-09 | Discharge: 2014-08-09 | Disposition: A | Discharge status: Home / Self Care | Payer: PPO | Attending: Emergency Medicine | Admitting: Emergency Medicine

## 2014-08-09 DIAGNOSIS — Y9389 Activity, other specified: Secondary | ICD-10-CM | POA: Insufficient documentation

## 2014-08-09 DIAGNOSIS — Z792 Long term (current) use of antibiotics: Secondary | ICD-10-CM | POA: Insufficient documentation

## 2014-08-09 DIAGNOSIS — Y998 Other external cause status: Secondary | ICD-10-CM | POA: Diagnosis not present

## 2014-08-09 DIAGNOSIS — S0101XA Laceration without foreign body of scalp, initial encounter: Secondary | ICD-10-CM | POA: Diagnosis not present

## 2014-08-09 DIAGNOSIS — Z862 Personal history of diseases of the blood and blood-forming organs and certain disorders involving the immune mechanism: Secondary | ICD-10-CM | POA: Diagnosis not present

## 2014-08-09 DIAGNOSIS — S0990XA Unspecified injury of head, initial encounter: Secondary | ICD-10-CM

## 2014-08-09 DIAGNOSIS — Q211 Atrial septal defect: Secondary | ICD-10-CM | POA: Insufficient documentation

## 2014-08-09 DIAGNOSIS — N400 Enlarged prostate without lower urinary tract symptoms: Secondary | ICD-10-CM | POA: Insufficient documentation

## 2014-08-09 DIAGNOSIS — R011 Cardiac murmur, unspecified: Secondary | ICD-10-CM | POA: Insufficient documentation

## 2014-08-09 DIAGNOSIS — K219 Gastro-esophageal reflux disease without esophagitis: Secondary | ICD-10-CM | POA: Insufficient documentation

## 2014-08-09 DIAGNOSIS — Z7982 Long term (current) use of aspirin: Secondary | ICD-10-CM | POA: Insufficient documentation

## 2014-08-09 DIAGNOSIS — Z79899 Other long term (current) drug therapy: Secondary | ICD-10-CM | POA: Diagnosis not present

## 2014-08-09 DIAGNOSIS — G8929 Other chronic pain: Secondary | ICD-10-CM | POA: Diagnosis not present

## 2014-08-09 DIAGNOSIS — K5900 Constipation, unspecified: Secondary | ICD-10-CM | POA: Diagnosis not present

## 2014-08-09 DIAGNOSIS — M1990 Unspecified osteoarthritis, unspecified site: Secondary | ICD-10-CM | POA: Insufficient documentation

## 2014-08-09 DIAGNOSIS — W1839XA Other fall on same level, initial encounter: Secondary | ICD-10-CM | POA: Diagnosis not present

## 2014-08-09 DIAGNOSIS — H269 Unspecified cataract: Secondary | ICD-10-CM | POA: Diagnosis not present

## 2014-08-09 DIAGNOSIS — Z87442 Personal history of urinary calculi: Secondary | ICD-10-CM | POA: Diagnosis not present

## 2014-08-09 DIAGNOSIS — Y92009 Unspecified place in unspecified non-institutional (private) residence as the place of occurrence of the external cause: Secondary | ICD-10-CM | POA: Insufficient documentation

## 2014-08-09 DIAGNOSIS — Z8673 Personal history of transient ischemic attack (TIA), and cerebral infarction without residual deficits: Secondary | ICD-10-CM | POA: Diagnosis not present

## 2014-08-09 DIAGNOSIS — Z86718 Personal history of other venous thrombosis and embolism: Secondary | ICD-10-CM | POA: Diagnosis not present

## 2014-08-09 DIAGNOSIS — Z87891 Personal history of nicotine dependence: Secondary | ICD-10-CM | POA: Diagnosis not present

## 2014-08-09 DIAGNOSIS — Z8619 Personal history of other infectious and parasitic diseases: Secondary | ICD-10-CM | POA: Insufficient documentation

## 2014-08-09 DIAGNOSIS — Z23 Encounter for immunization: Secondary | ICD-10-CM | POA: Insufficient documentation

## 2014-08-09 MED ORDER — TETANUS-DIPHTH-ACELL PERTUSSIS 5-2.5-18.5 LF-MCG/0.5 IM SUSP
0.5000 mL | Freq: Once | INTRAMUSCULAR | Status: AC
  Administered 2014-08-09: 0.5 mL via INTRAMUSCULAR
  Filled 2014-08-09: qty 0.5

## 2014-08-09 MED ORDER — LIDOCAINE-EPINEPHRINE (PF) 2 %-1:200000 IJ SOLN
10.0000 mL | Freq: Once | INTRAMUSCULAR | Status: AC
  Administered 2014-08-09: 10 mL via INTRADERMAL
  Filled 2014-08-09: qty 20

## 2014-08-09 MED ORDER — BACITRACIN 500 UNIT/GM EX OINT
1.0000 "application " | TOPICAL_OINTMENT | Freq: Once | CUTANEOUS | Status: AC
  Administered 2014-08-09: 1 via TOPICAL
  Filled 2014-08-09: qty 0.9

## 2014-08-09 NOTE — ED Notes (Signed)
Pt to CT

## 2014-08-09 NOTE — ED Notes (Signed)
EDP at bedside  

## 2014-08-09 NOTE — ED Notes (Signed)
Pt in EMS from home. Fell approx 0400. Walking to bathroom with walker, walker tipped over and pt hit head. Skin tear to back of head, bleeding controlled. Denies LOC. A/O X4. Denies taking blood thinners

## 2014-08-09 NOTE — ED Provider Notes (Signed)
TIME SEEN: 6:15 AM   CHIEF COMPLAINT: Fall, head injury, scalp laceration  HPI: Pt is a 81 y.o. male with history of DVT who is not on anticoagulation but does take aspirin 81 mg daily he presents to the emergency department after he had a fall at home. Reports he is trying to get up to go the bathroom and lost his footing and using his walker and fell. Hit his head. He has a laceration to the back of the head. Denies headache, neck pain, back pain. No chest or abdominal pain. No numbness, tingling or focal weakness.  ROS: See HPI Constitutional: no fever  Eyes: no drainage  ENT: no runny nose   Cardiovascular:  no chest pain  Resp: no SOB  GI: no vomiting GU: no dysuria Integumentary: no rash  Allergy: no hives  Musculoskeletal: no leg swelling  Neurological: no slurred speech ROS otherwise negative  PAST MEDICAL HISTORY/PAST SURGICAL HISTORY:  Past Medical History  Diagnosis Date  . Arthritis   . Blood transfusion   . Renal disorder   . Thrombocytopenia   . Heart murmur   . Chronic back pain   . Peripheral edema     was on Lasix but has been off for about 3months  . GERD (gastroesophageal reflux disease)     takes Prilosec daily  . GI bleed     hx of   . Constipation     takes Senna daily and eats Metamucil daily  . Urinary frequency   . History of kidney stones   . History of blood transfusion     no abnormal reaction noted  . Cataracts, bilateral     immature  . History of blood clots     left leg  . History of Clostridium difficile infection   . BPH (benign prostatic hyperplasia)   . Stroke 08/22/2012  . Esophageal varices with bleeding approx 20 years ago    per pt history  . Thrombocytopenia, unspecified 08/28/2012  . PFO (patent foramen ovale) 08/28/2012    Per TEE 08/2012    MEDICATIONS:  Prior to Admission medications   Medication Sig Start Date End Date Taking? Authorizing Provider  aspirin EC 81 MG EC tablet Take 1 tablet (81 mg total) by mouth  daily. 09/05/12   Jacquelynn Cree, PA-C  azithromycin (ZITHROMAX) 250 MG tablet Take 1 tablet (250 mg total) by mouth daily. 06/24/14   Christiane Ha, MD  cefUROXime (CEFTIN) 500 MG tablet Take 1 tablet (500 mg total) by mouth 2 (two) times daily with a meal. 06/25/14   Christiane Ha, MD  furosemide (LASIX) 20 MG tablet Take 20 mg by mouth as needed for fluid.     Historical Provider, MD  gabapentin (NEURONTIN) 100 MG capsule Take 100 mg by mouth 2 (two) times daily.     Historical Provider, MD  omeprazole (PRILOSEC) 20 MG capsule Take 20 mg by mouth daily.    Historical Provider, MD  riboflavin (VITAMIN B-2) 100 MG TABS tablet Take 100 mg by mouth daily.    Historical Provider, MD  senna-docusate (SENOKOT-S) 8.6-50 MG per tablet Take 1 tablet by mouth 2 (two) times daily. 09/05/12   Jacquelynn Cree, PA-C  tamsulosin (FLOMAX) 0.4 MG CAPS capsule Take 0.4 mg by mouth daily.    Historical Provider, MD    ALLERGIES:  Allergies  Allergen Reactions  . Chlorhexidine Rash    SOCIAL HISTORY:  History  Substance Use Topics  . Smoking status: Former  Smoker    Types: Cigarettes    Quit date: 02/03/1973  . Smokeless tobacco: Never Used  . Alcohol Use: No    FAMILY HISTORY: No family history on file.  EXAM: BP 117/50 mmHg  Pulse 48  Temp(Src) 97.6 F (36.4 C) (Oral)  Resp 15  Ht 5\' 7"  (1.702 m)  Wt 125 lb (56.7 kg)  BMI 19.57 kg/m2  SpO2 99% CONSTITUTIONAL: Alert and oriented and responds appropriately to questions. Well-appearing; well-nourished; GCS 15 HEAD: Normocephalic; 4 cm superficial laceration to the posterior scalp with no bony deformity EYES: Conjunctivae clear, PERRL, EOMI ENT: normal nose; no rhinorrhea; moist mucous membranes; pharynx without lesions noted; no dental injury; no septal hematoma NECK: Supple, no meningismus, no LAD; no midline spinal tenderness, step-off or deformity CARD: RRR; S1 and S2 appreciated; no murmurs, no clicks, no rubs, no gallops RESP: Normal  chest excursion without splinting or tachypnea; breath sounds clear and equal bilaterally; no wheezes, no rhonchi, no rales; no hypoxia or respiratory distress CHEST:  chest wall stable, no crepitus or ecchymosis or deformity, nontender to palpation ABD/GI: Normal bowel sounds; non-distended; soft, non-tender, no rebound, no guarding PELVIS:  stable, nontender to palpation BACK:  The back appears normal and is non-tender to palpation, there is no CVA tenderness; no midline spinal tenderness, step-off or deformity EXT: Normal ROM in all joints; non-tender to palpation; no edema; normal capillary refill; no cyanosis, no bony tenderness or bony deformity of patient's extremities, no joint effusion, no ecchymosis or lacerations    SKIN: Normal color for age and race; warm NEURO: Moves all extremities equally, sensation to light touch intact diffusely, cranial nerves II through XII intact PSYCH: The patient's mood and manner are appropriate. Grooming and personal hygiene are appropriate.  MEDICAL DECISION MAKING: Patient here with mechanical fall. CT head and cervical spine show no acute injury. Scalp laceration repaired using staples. Discussed head injury return precautions with patient and family. He is at his neurologic baseline without complaints. They verbalize understanding and are comfortable with this plan.       EKG Interpretation  Date/Time:  Sunday August 09 2014 05:32:06 EDT Ventricular Rate:  46 PR Interval:  133 QRS Duration: 92 QT Interval:  525 QTC Calculation: 459 R Axis:   52 Text Interpretation:  Bradycardia with irregular rate Nonspecific T abnormalities, lateral leads No significant change since last tracing Confirmed by Inessa Wardrop,  DO, Nashalie Sallis ([id number]) on 08/09/2014 5:38:17 AM      LACERATION REPAIR Performed by: Raelyn Number Authorized by: Raelyn Number Consent: Verbal consent obtained. Risks and benefits: risks, benefits and alternatives were discussed Consent given  by: patient Patient identity confirmed: provided demographic data Prepped and Draped in normal sterile fashion Wound explored  Laceration Location: Posterior scalp  Laceration Length: 4 cm  No Foreign Bodies seen or palpated  Anesthesia: local infiltration  Local anesthetic: lidocaine 2 % with epinephrine  Anesthetic total: 4 ml  Irrigation method: syringe Amount of cleaning: standard  Skin closure: Simple using 3 staples   Number of staples: 3 staples  Technique: Area anesthetized using lidocaine 2% with epinephrine. Irrigated with saline, cleaned with Betadine and repaired using 3 staples. Bacitracin and sterile dressing applied.   Patient tolerance: Patient tolerated the procedure well with no immediate complications.   Layla Maw Ramadan Couey, DO 08/09/14 [phone]

## 2014-08-09 NOTE — Discharge Instructions (Signed)

## 2014-08-21 ENCOUNTER — Ambulatory Visit: Payer: PPO | Admitting: Neurology

## 2014-09-04 ENCOUNTER — Ambulatory Visit: Payer: PPO | Admitting: Neurology

## 2014-09-08 ENCOUNTER — Encounter: Payer: Self-pay | Admitting: Neurology

## 2014-10-02 ENCOUNTER — Telehealth: Payer: Self-pay | Admitting: Neurology

## 2014-10-02 NOTE — Telephone Encounter (Signed)
I'm sorry for what happened to this patient but I would be willing to see him in the future if he decides to reschedule

## 2014-10-02 NOTE — Telephone Encounter (Signed)
Patient presented today for an 11am appt that was not on our schedule.  He stated on 9/2 he arrived late and Seth Bake rescheduled him for 9/30 at 11am.  I apologized for any mix-up and advised that Dr. Pearlean Brownie could see him at noon.  The patient became very upset and stated his wife was at home very sick and he couldn't stay because he had to go tend to her.  I walked the patient to his car and he advised that he would not be coming back as he didn't have a Stroke and he doesn't understand why he was even coming to begin with.

## 2014-10-14 ENCOUNTER — Other Ambulatory Visit: Payer: Self-pay | Admitting: Orthopaedic Surgery

## 2014-10-14 DIAGNOSIS — R1310 Dysphagia, unspecified: Secondary | ICD-10-CM

## 2014-10-19 ENCOUNTER — Other Ambulatory Visit: Payer: PPO

## 2014-10-28 ENCOUNTER — Other Ambulatory Visit: Payer: PPO

## 2014-11-03 ENCOUNTER — Other Ambulatory Visit: Payer: PPO

## 2014-11-11 ENCOUNTER — Ambulatory Visit
Admission: RE | Admit: 2014-11-11 | Discharge: 2014-11-11 | Disposition: A | Discharge status: Home / Self Care | Payer: Medicare Other | Source: Ambulatory Visit | Attending: Orthopaedic Surgery | Admitting: Orthopaedic Surgery

## 2014-11-11 ENCOUNTER — Other Ambulatory Visit: Payer: PPO

## 2014-11-11 DIAGNOSIS — R1310 Dysphagia, unspecified: Secondary | ICD-10-CM

## 2015-01-19 ENCOUNTER — Inpatient Hospital Stay (HOSPITAL_COMMUNITY)
Admission: EM | Admit: 2015-01-19 | Discharge: 2015-01-22 | DRG: 183 | Disposition: A | Discharge status: Skilled Nursing Facility | Payer: PPO | Attending: Surgery | Admitting: Surgery

## 2015-01-19 ENCOUNTER — Emergency Department (HOSPITAL_COMMUNITY): Payer: PPO

## 2015-01-19 ENCOUNTER — Encounter (HOSPITAL_COMMUNITY): Payer: Self-pay | Admitting: Emergency Medicine

## 2015-01-19 DIAGNOSIS — S36039A Unspecified laceration of spleen, initial encounter: Secondary | ICD-10-CM | POA: Diagnosis present

## 2015-01-19 DIAGNOSIS — M479 Spondylosis, unspecified: Secondary | ICD-10-CM | POA: Diagnosis present

## 2015-01-19 DIAGNOSIS — K5900 Constipation, unspecified: Secondary | ICD-10-CM | POA: Diagnosis present

## 2015-01-19 DIAGNOSIS — S36031A Moderate laceration of spleen, initial encounter: Secondary | ICD-10-CM | POA: Diagnosis present

## 2015-01-19 DIAGNOSIS — R188 Other ascites: Secondary | ICD-10-CM | POA: Diagnosis present

## 2015-01-19 DIAGNOSIS — S2242XA Multiple fractures of ribs, left side, initial encounter for closed fracture: Secondary | ICD-10-CM | POA: Diagnosis not present

## 2015-01-19 DIAGNOSIS — S0101XA Laceration without foreign body of scalp, initial encounter: Secondary | ICD-10-CM | POA: Diagnosis present

## 2015-01-19 DIAGNOSIS — Z87891 Personal history of nicotine dependence: Secondary | ICD-10-CM

## 2015-01-19 DIAGNOSIS — Z7982 Long term (current) use of aspirin: Secondary | ICD-10-CM | POA: Diagnosis not present

## 2015-01-19 DIAGNOSIS — K219 Gastro-esophageal reflux disease without esophagitis: Secondary | ICD-10-CM | POA: Diagnosis present

## 2015-01-19 DIAGNOSIS — D649 Anemia, unspecified: Secondary | ICD-10-CM | POA: Diagnosis present

## 2015-01-19 DIAGNOSIS — R1310 Dysphagia, unspecified: Secondary | ICD-10-CM | POA: Diagnosis present

## 2015-01-19 DIAGNOSIS — I429 Cardiomyopathy, unspecified: Secondary | ICD-10-CM | POA: Diagnosis present

## 2015-01-19 DIAGNOSIS — S2220XA Unspecified fracture of sternum, initial encounter for closed fracture: Secondary | ICD-10-CM | POA: Diagnosis present

## 2015-01-19 DIAGNOSIS — K7460 Unspecified cirrhosis of liver: Secondary | ICD-10-CM | POA: Diagnosis present

## 2015-01-19 DIAGNOSIS — D696 Thrombocytopenia, unspecified: Secondary | ICD-10-CM | POA: Diagnosis present

## 2015-01-19 DIAGNOSIS — S61411A Laceration without foreign body of right hand, initial encounter: Secondary | ICD-10-CM | POA: Diagnosis present

## 2015-01-19 DIAGNOSIS — J189 Pneumonia, unspecified organism: Secondary | ICD-10-CM | POA: Diagnosis present

## 2015-01-19 DIAGNOSIS — D539 Nutritional anemia, unspecified: Secondary | ICD-10-CM | POA: Diagnosis present

## 2015-01-19 DIAGNOSIS — E876 Hypokalemia: Secondary | ICD-10-CM | POA: Diagnosis present

## 2015-01-19 DIAGNOSIS — Z8673 Personal history of transient ischemic attack (TIA), and cerebral infarction without residual deficits: Secondary | ICD-10-CM | POA: Diagnosis not present

## 2015-01-19 DIAGNOSIS — Z883 Allergy status to other anti-infective agents status: Secondary | ICD-10-CM

## 2015-01-19 DIAGNOSIS — S2249XA Multiple fractures of ribs, unspecified side, initial encounter for closed fracture: Secondary | ICD-10-CM | POA: Diagnosis present

## 2015-01-19 DIAGNOSIS — N400 Enlarged prostate without lower urinary tract symptoms: Secondary | ICD-10-CM | POA: Diagnosis present

## 2015-01-19 DIAGNOSIS — M542 Cervicalgia: Secondary | ICD-10-CM | POA: Diagnosis present

## 2015-01-19 LAB — I-STAT CHEM 8, ED
BUN: 11 mg/dL (ref 6–20)
CHLORIDE: 103 mmol/L (ref 101–111)
CREATININE: 0.9 mg/dL (ref 0.61–1.24)
Calcium, Ion: 1.15 mmol/L (ref 1.13–1.30)
GLUCOSE: 82 mg/dL (ref 65–99)
HEMATOCRIT: 28 % — AB (ref 39.0–52.0)
Hemoglobin: 9.5 g/dL — ABNORMAL LOW (ref 13.0–17.0)
Potassium: 3.3 mmol/L — ABNORMAL LOW (ref 3.5–5.1)
SODIUM: 143 mmol/L (ref 135–145)
TCO2: 28 mmol/L (ref 0–100)

## 2015-01-19 LAB — I-STAT TROPONIN, ED: Troponin i, poc: 0.03 ng/mL (ref 0.00–0.08)

## 2015-01-19 LAB — CBC WITH DIFFERENTIAL/PLATELET
BASOS ABS: 0 10*3/uL (ref 0.0–0.1)
Basophils Relative: 0 %
EOS ABS: 0.2 10*3/uL (ref 0.0–0.7)
EOS PCT: 3 %
HCT: 28.7 % — ABNORMAL LOW (ref 39.0–52.0)
Hemoglobin: 9 g/dL — ABNORMAL LOW (ref 13.0–17.0)
Lymphocytes Relative: 14 %
Lymphs Abs: 1 10*3/uL (ref 0.7–4.0)
MCH: 31.9 pg (ref 26.0–34.0)
MCHC: 31.4 g/dL (ref 30.0–36.0)
MCV: 101.8 fL — AB (ref 78.0–100.0)
MONO ABS: 0.5 10*3/uL (ref 0.1–1.0)
Monocytes Relative: 7 %
Neutro Abs: 5.3 10*3/uL (ref 1.7–7.7)
Neutrophils Relative %: 76 %
Platelets: 105 10*3/uL — ABNORMAL LOW (ref 150–400)
RBC: 2.82 MIL/uL — AB (ref 4.22–5.81)
RDW: 13.7 % (ref 11.5–15.5)
WBC: 7 10*3/uL (ref 4.0–10.5)

## 2015-01-19 LAB — COMPREHENSIVE METABOLIC PANEL
ALT: 10 U/L — AB (ref 17–63)
AST: 26 U/L (ref 15–41)
Albumin: 2 g/dL — ABNORMAL LOW (ref 3.5–5.0)
Alkaline Phosphatase: 90 U/L (ref 38–126)
Anion gap: 8 (ref 5–15)
BUN: 11 mg/dL (ref 6–20)
CO2: 27 mmol/L (ref 22–32)
CREATININE: 0.96 mg/dL (ref 0.61–1.24)
Calcium: 8.4 mg/dL — ABNORMAL LOW (ref 8.9–10.3)
Chloride: 106 mmol/L (ref 101–111)
Glucose, Bld: 83 mg/dL (ref 65–99)
POTASSIUM: 3.5 mmol/L (ref 3.5–5.1)
SODIUM: 141 mmol/L (ref 135–145)
Total Bilirubin: 0.8 mg/dL (ref 0.3–1.2)
Total Protein: 6.1 g/dL — ABNORMAL LOW (ref 6.5–8.1)

## 2015-01-19 MED ORDER — GABAPENTIN 100 MG PO CAPS
100.0000 mg | ORAL_CAPSULE | Freq: Three times a day (TID) | ORAL | Status: DC
  Administered 2015-01-20 – 2015-01-22 (×8): 100 mg via ORAL
  Filled 2015-01-19 (×9): qty 1

## 2015-01-19 MED ORDER — ONDANSETRON HCL 4 MG/2ML IJ SOLN: 4.0000 mg | Freq: Four times a day (QID) | INTRAMUSCULAR | Status: DC | PRN

## 2015-01-19 MED ORDER — ACETAMINOPHEN-CODEINE #3 300-30 MG PO TABS
1.0000 | ORAL_TABLET | Freq: Once | ORAL | Status: AC
  Administered 2015-01-19: 1 via ORAL
  Filled 2015-01-19: qty 1

## 2015-01-19 MED ORDER — ENOXAPARIN SODIUM 30 MG/0.3ML ~~LOC~~ SOLN
30.0000 mg | SUBCUTANEOUS | Status: DC
  Filled 2015-01-19: qty 0.3

## 2015-01-19 MED ORDER — IOHEXOL 300 MG/ML  SOLN
80.0000 mL | Freq: Once | INTRAMUSCULAR | Status: AC | PRN
  Administered 2015-01-19: 75 mL via INTRAVENOUS

## 2015-01-19 MED ORDER — IOHEXOL 300 MG/ML  SOLN
80.0000 mL | Freq: Once | INTRAMUSCULAR | Status: AC | PRN
  Administered 2015-01-19: 80 mL via INTRAVENOUS

## 2015-01-19 MED ORDER — FENTANYL CITRATE (PF) 100 MCG/2ML IJ SOLN
50.0000 ug | Freq: Once | INTRAMUSCULAR | Status: AC
  Administered 2015-01-19: 50 ug via INTRAVENOUS
  Filled 2015-01-19: qty 2

## 2015-01-19 MED ORDER — DEXTROSE 5 % IV SOLN
1.0000 g | Freq: Once | INTRAVENOUS | Status: AC
  Administered 2015-01-19: 1 g via INTRAVENOUS
  Filled 2015-01-19: qty 10

## 2015-01-19 MED ORDER — ONDANSETRON HCL 4 MG PO TABS: 4.0000 mg | ORAL_TABLET | Freq: Four times a day (QID) | ORAL | Status: DC | PRN

## 2015-01-19 MED ORDER — MORPHINE SULFATE (PF) 4 MG/ML IV SOLN: 4.0000 mg | INTRAVENOUS | Status: DC | PRN

## 2015-01-19 MED ORDER — HYDROCODONE-ACETAMINOPHEN 5-325 MG PO TABS: 0.5000 | ORAL_TABLET | ORAL | Status: DC | PRN

## 2015-01-19 MED ORDER — MORPHINE SULFATE (PF) 2 MG/ML IV SOLN: 1.0000 mg | INTRAVENOUS | Status: DC | PRN

## 2015-01-19 MED ORDER — PANTOPRAZOLE SODIUM 40 MG PO TBEC
40.0000 mg | DELAYED_RELEASE_TABLET | Freq: Every day | ORAL | Status: DC
  Administered 2015-01-20 – 2015-01-22 (×3): 40 mg via ORAL
  Filled 2015-01-19 (×3): qty 1

## 2015-01-19 MED ORDER — SODIUM CHLORIDE 0.9 % IV BOLUS (SEPSIS)
500.0000 mL | Freq: Once | INTRAVENOUS | Status: AC
  Administered 2015-01-19: 500 mL via INTRAVENOUS

## 2015-01-19 MED ORDER — DEXTROSE 5 % IV SOLN
500.0000 mg | Freq: Once | INTRAVENOUS | Status: DC
  Administered 2015-01-19: 500 mg via INTRAVENOUS
  Filled 2015-01-19: qty 500

## 2015-01-19 MED ORDER — HYDROCODONE-ACETAMINOPHEN 5-325 MG PO TABS: 1.0000 | ORAL_TABLET | ORAL | Status: DC | PRN

## 2015-01-19 MED ORDER — SODIUM CHLORIDE 0.9 % IV SOLN
INTRAVENOUS | Status: DC
  Administered 2015-01-20: 01:00:00 via INTRAVENOUS

## 2015-01-19 MED ORDER — TAMSULOSIN HCL 0.4 MG PO CAPS
0.4000 mg | ORAL_CAPSULE | Freq: Every day | ORAL | Status: DC
  Administered 2015-01-20 – 2015-01-22 (×3): 0.4 mg via ORAL
  Filled 2015-01-19 (×3): qty 1

## 2015-01-19 MED ORDER — HYDROCODONE-ACETAMINOPHEN 5-325 MG PO TABS
2.0000 | ORAL_TABLET | ORAL | Status: DC | PRN
Start: 2015-01-19 — End: 2015-01-20
  Administered 2015-01-19 – 2015-01-20 (×2): 2 via ORAL
  Filled 2015-01-19 (×2): qty 2

## 2015-01-19 NOTE — ED Notes (Signed)
Doctor at bedside.
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
## 2015-01-19 NOTE — ED Notes (Signed)
Patient transported to CT 

## 2015-01-19 NOTE — ED Notes (Signed)
C-collar applied prior to arrival.
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
## 2015-01-19 NOTE — ED Notes (Signed)
Spoke with EDP no blood cultures needed for patient
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
## 2015-01-19 NOTE — Progress Notes (Signed)
Patient ID: Brandon Hardy, male   DOB: 05-May-1927, 82 y.o.   MRN: [id number]  CT shows significant changes of cirrhosis as well as a possible small splenic laceration.

## 2015-01-20 ENCOUNTER — Inpatient Hospital Stay (HOSPITAL_COMMUNITY): Payer: PPO

## 2015-01-20 DIAGNOSIS — S2220XA Unspecified fracture of sternum, initial encounter for closed fracture: Secondary | ICD-10-CM | POA: Diagnosis present

## 2015-01-20 DIAGNOSIS — S36039A Unspecified laceration of spleen, initial encounter: Secondary | ICD-10-CM | POA: Diagnosis present

## 2015-01-20 DIAGNOSIS — K7460 Unspecified cirrhosis of liver: Secondary | ICD-10-CM | POA: Diagnosis present

## 2015-01-20 DIAGNOSIS — S2242XA Multiple fractures of ribs, left side, initial encounter for closed fracture: Secondary | ICD-10-CM | POA: Diagnosis present

## 2015-01-20 DIAGNOSIS — D649 Anemia, unspecified: Secondary | ICD-10-CM | POA: Diagnosis present

## 2015-01-20 LAB — BASIC METABOLIC PANEL
Anion gap: 4 — ABNORMAL LOW (ref 5–15)
BUN: 9 mg/dL (ref 6–20)
CALCIUM: 7.8 mg/dL — AB (ref 8.9–10.3)
CO2: 28 mmol/L (ref 22–32)
CREATININE: 0.95 mg/dL (ref 0.61–1.24)
Chloride: 107 mmol/L (ref 101–111)
GFR calc Af Amer: >60 mL/min (ref >60)
GFR calc non Af Amer: >60 mL/min (ref >60)
GLUCOSE: 89 mg/dL (ref 65–99)
Potassium: 3.3 mmol/L — ABNORMAL LOW (ref 3.5–5.1)
Sodium: 139 mmol/L (ref 135–145)

## 2015-01-20 LAB — CBC
HEMATOCRIT: 27.4 % — AB (ref 39.0–52.0)
Hemoglobin: 8.9 g/dL — ABNORMAL LOW (ref 13.0–17.0)
MCH: 33.2 pg (ref 26.0–34.0)
MCHC: 32.5 g/dL (ref 30.0–36.0)
MCV: 102.2 fL — AB (ref 78.0–100.0)
Platelets: 91 10*3/uL — ABNORMAL LOW (ref 150–400)
RBC: 2.68 MIL/uL — ABNORMAL LOW (ref 4.22–5.81)
RDW: 14 % (ref 11.5–15.5)
WBC: 5 10*3/uL (ref 4.0–10.5)

## 2015-01-20 LAB — MRSA PCR SCREENING: MRSA BY PCR: NEGATIVE

## 2015-01-20 MED ORDER — TRAMADOL HCL 50 MG PO TABS
50.0000 mg | ORAL_TABLET | Freq: Four times a day (QID) | ORAL | Status: DC | PRN
  Administered 2015-01-20 – 2015-01-21 (×2): 100 mg via ORAL
  Filled 2015-01-20 (×2): qty 2

## 2015-01-20 MED ORDER — POTASSIUM CHLORIDE CRYS ER 20 MEQ PO TBCR
20.0000 meq | EXTENDED_RELEASE_TABLET | Freq: Two times a day (BID) | ORAL | Status: DC
  Administered 2015-01-20 (×2): 20 meq via ORAL
  Filled 2015-01-20 (×2): qty 1

## 2015-01-20 MED ORDER — NAPROXEN 250 MG PO TABS
500.0000 mg | ORAL_TABLET | Freq: Two times a day (BID) | ORAL | Status: DC
  Administered 2015-01-20 – 2015-01-22 (×5): 500 mg via ORAL
  Filled 2015-01-20 (×5): qty 2

## 2015-01-20 MED ORDER — ENOXAPARIN SODIUM 40 MG/0.4ML ~~LOC~~ SOLN
40.0000 mg | SUBCUTANEOUS | Status: DC
  Administered 2015-01-21 – 2015-01-22 (×2): 40 mg via SUBCUTANEOUS
  Filled 2015-01-20 (×2): qty 0.4

## 2015-01-20 MED ORDER — MORPHINE SULFATE (PF) 2 MG/ML IV SOLN
2.0000 mg | INTRAVENOUS | Status: DC | PRN
  Administered 2015-01-20: 2 mg via INTRAVENOUS
  Filled 2015-01-20: qty 1

## 2015-01-20 NOTE — ED Notes (Signed)
Admitting MD at the bedside.  

## 2015-01-20 NOTE — Care Management (Addendum)
ED CM notified that patient is requesting to speak with CM. CM met with patient at bedside patient reports he is wanting to be placed at [facility] upon discharge. CM explained to patient that a CSW consult has been placed, and CSW department is currently gone for the day, but CSW will meet with him in the am. Patient was amendable.

## 2015-01-20 NOTE — ED Notes (Signed)
MD reported to allow patient to stay on Clear Liquid diet even with SLP ordered.
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
## 2015-01-21 ENCOUNTER — Inpatient Hospital Stay (HOSPITAL_COMMUNITY): Payer: PPO

## 2015-01-21 LAB — BASIC METABOLIC PANEL
ANION GAP: 6 (ref 5–15)
BUN: 9 mg/dL (ref 6–20)
CHLORIDE: 107 mmol/L (ref 101–111)
CO2: 27 mmol/L (ref 22–32)
Calcium: 8 mg/dL — ABNORMAL LOW (ref 8.9–10.3)
Creatinine, Ser: 0.86 mg/dL (ref 0.61–1.24)
GFR calc Af Amer: >60 mL/min (ref >60)
GLUCOSE: 81 mg/dL (ref 65–99)
POTASSIUM: 4.6 mmol/L (ref 3.5–5.1)
Sodium: 140 mmol/L (ref 135–145)

## 2015-01-21 LAB — CBC
HEMATOCRIT: 27 % — AB (ref 39.0–52.0)
HEMOGLOBIN: 8.8 g/dL — AB (ref 13.0–17.0)
MCH: 32.6 pg (ref 26.0–34.0)
MCHC: 32.6 g/dL (ref 30.0–36.0)
MCV: 100 fL (ref 78.0–100.0)
PLATELETS: 103 10*3/uL — AB (ref 150–400)
RBC: 2.7 MIL/uL — AB (ref 4.22–5.81)
RDW: 13.8 % (ref 11.5–15.5)
WBC: 5.5 10*3/uL (ref 4.0–10.5)

## 2015-01-21 MED ORDER — ASPIRIN EC 81 MG PO TBEC
81.0000 mg | DELAYED_RELEASE_TABLET | Freq: Every day | ORAL | Status: DC
  Administered 2015-01-21 – 2015-01-22 (×2): 81 mg via ORAL
  Filled 2015-01-21 (×2): qty 1

## 2015-01-21 MED ORDER — POTASSIUM CHLORIDE CRYS ER 20 MEQ PO TBCR
20.0000 meq | EXTENDED_RELEASE_TABLET | Freq: Every day | ORAL | Status: DC
  Administered 2015-01-21 – 2015-01-22 (×2): 20 meq via ORAL
  Filled 2015-01-21 (×2): qty 1

## 2015-01-21 MED ORDER — RESOURCE THICKENUP CLEAR PO POWD
ORAL | Status: DC | PRN
  Filled 2015-01-21: qty 125

## 2015-01-21 MED ORDER — ACETAMINOPHEN-CODEINE #3 300-30 MG PO TABS
1.0000 | ORAL_TABLET | ORAL | Status: DC | PRN
  Administered 2015-01-21 – 2015-01-22 (×6): 2 via ORAL
  Filled 2015-01-21 (×7): qty 2
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
## 2015-01-21 NOTE — Clinical Social Work Note (Addendum)
Clinical Social Work Assessment  Patient Details  Name: Brandon Hardy MRN: [id number] Date of Birth: [birth year]  Date of referral:  01/21/15               Reason for consult:  Trauma                Permission sought to share information with:  Family Supports Permission granted to share information::  Yes, Verbal Permission Granted  Name::     Lieutenant Abarca  Relationship::  Son  Contact Information:  [phone]  Housing/Transportation Living arrangements for the past 2 months:  [place] of Information:  Patient, Adult Children Patient Interpreter Needed:  None Criminal Activity/Legal Involvement Pertinent to Current Situation/Hospitalization:  No - Comment as needed Significant Relationships:  Adult Children Lives with:  Spouse Do you feel safe going back to the place where you live?  No (Patient and patient wife looking for long term placement) Need for family participation in patient care:  Yes (Comment)  Care giving concerns:  Patient is the primary caregiver for his wife.  Patient son lives out of state and is only here temporarily to assist with patient wife.   Social Worker assessment / plan:  Holiday representative met with patient at bedside to offer support and to discuss patient needs at discharge.  Patient lives at home with his wife and is the primary caregiver.  Patient is hopeful for long term SNF placement at [facility] for him and his wife.  CSW explained that patient wife will need authorization from primary MD office, unable to place due to her not being a patient at this time.  CSW also provided insight that [facility] will not work with Nature conservation officer and does not have a bed available.  Patient is open to additional options for placement.  Patient requested contact be made with his son to further discuss current discharge options.  CSW to contact patient son to update on current status.  CSW remains available for support to facilitate patient  discharge needs once medically stable.  Employment status:  Retired Nurse, adult PT Recommendations:  [place] / Referral to community resources:  [place]  Patient/Family's Response to care:  Patient is agreeable with SNF placement and requesting placement at the same facility with his wife.  Patient requesting assistance from family to determine appropriate discharge venue.  Patient/Family's Understanding of and Emotional Response to Diagnosis, Current Treatment, and Prognosis:  Patient realistic regarding his need for long term placement for him and his wife.  Patient needs support from family in order to determine most appropriate discharge options.  Emotional Assessment Appearance:  Appears older than stated age, Disheveled Attitude/Demeanor/Rapport:   (Cooperative and Engaged) Affect (typically observed):  Appropriate, Frustrated, Overwhelmed Orientation:  Oriented to Self, Oriented to Place, Oriented to  Time, Oriented to Situation Alcohol / Substance use:  Never Used Psych involvement (Current and /or in the community):  No (Comment)  Discharge Needs  Concerns to be addressed:  Discharge Planning Concerns Readmission within the last 30 days:  No Current discharge risk:  Lack of support system, Dependent with Mobility Barriers to Discharge:  Continued Medical Work up  [facility] [employer], [place]

## 2015-01-21 NOTE — Plan of Care (Signed)
Problem: Education: Goal: Knowledge of [facility] Education information/materials will improve Outcome: Progressing Educated on diet in relation to swallowing and about follow up modified barium swallow test later today  Comments:  VSS remain stable, pain is controlled, will continue to monitor.

## 2015-01-21 NOTE — Care Management Note (Signed)
Case Management Note  Patient Details  Name: Brandon Hardy MRN: [id number] Date of Birth: [birth year]  Subjective/Objective:    Patient is interested in talking to CSW about going to [facility], NCM contacted CSW and made aware.                  Action/Plan:   Expected Discharge Date:                  Expected Discharge Plan:  Skilled Nursing Facility  In-House Referral:  Clinical Social Work  Discharge planning Services  CM Consult  Post Acute Care Choice:    Choice offered to:     DME Arranged:    DME Agency:     HH Arranged:    HH Agency:     Status of Service:  In process, will continue to follow  Medicare Important Message Given:    Date Medicare IM Given:    Medicare IM give by:    Date Additional Medicare IM Given:    Additional Medicare Important Message give by:     If discussed at Long Length of Stay Meetings, dates discussed:    Additional Comments:  Leone Haven, RN 01/21/2015, 11:59 AM

## 2015-01-21 NOTE — Progress Notes (Signed)
PT Cancellation Note  Patient Details Name: NALIN MAZZOCCO MRN: [id number] DOB: 03-Mar-1927   Cancelled Treatment:    Reason Eval/Treat Not Completed: Patient at procedure or test/unavailable.  Attempted to see pt but pt transferring for swallow study.  PT will continue to follow acutely and will attempt to see pt again later today, time permitting.   [name] PT, DPT [phone] Pager: [phone] 01/21/2015, 1:46 PM
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
## 2015-01-21 NOTE — Clinical Social Work Placement (Signed)
   CLINICAL SOCIAL WORK PLACEMENT  NOTE  Date:  01/21/2015  Patient Details  Name: Brandon Hardy MRN: [id number] Date of Birth: 1927/03/31  Clinical Social Work is seeking post-discharge placement for this patient at the Skilled  Nursing Facility level of care (*CSW will initial, date and re-position this form in  chart as items are completed):  Yes   Patient/family provided with [place] Clinical Social Work Department's list of facilities offering this level of care within the geographic area requested by the patient (or if unable, by the patient's family).  Yes   Patient/family informed of their freedom to choose among providers that offer the needed level of care, that participate in Medicare, Medicaid or managed care program needed by the patient, have an available bed and are willing to accept the patient.  Yes   Patient/family informed of [facility]'s ownership interest in [facility] and [facility], as well as of the fact that they are under no obligation to receive care at these facilities.  PASRR submitted to EDS on       PASRR number received on       Existing PASRR number confirmed on 01/21/15     FL2 transmitted to all facilities in geographic area requested by pt/family on 01/21/15     FL2 transmitted to all facilities within larger geographic area on       Patient informed that his/her managed care company has contracts with or will negotiate with certain facilities, including the following:            Patient/family informed of bed offers received.  Patient chooses bed at       Physician recommends and patient chooses bed at      Patient to be transferred to   on  .  Patient to be transferred to facility by       Patient family notified on   of transfer.  Name of family member notified:        PHYSICIAN Please sign FL2     Additional Comment:    [name], LCSW [phone]

## 2015-01-21 NOTE — Progress Notes (Signed)
MBSS complete. Full report located under chart review in imaging section. Benz Vandenberghe, MA CCC-SLP [phone]  
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
## 2015-01-22 LAB — BASIC METABOLIC PANEL
Anion gap: 3 — ABNORMAL LOW (ref 5–15)
BUN: 11 mg/dL (ref 6–20)
CHLORIDE: 108 mmol/L (ref 101–111)
CO2: 28 mmol/L (ref 22–32)
Calcium: 7.8 mg/dL — ABNORMAL LOW (ref 8.9–10.3)
Creatinine, Ser: 1.07 mg/dL (ref 0.61–1.24)
GFR calc Af Amer: >60 mL/min (ref >60)
GFR calc non Af Amer: >60 mL/min (ref >60)
GLUCOSE: 81 mg/dL (ref 65–99)
POTASSIUM: 4.7 mmol/L (ref 3.5–5.1)
Sodium: 139 mmol/L (ref 135–145)

## 2015-01-22 LAB — CBC
HEMATOCRIT: 26.6 % — AB (ref 39.0–52.0)
Hemoglobin: 8.2 g/dL — ABNORMAL LOW (ref 13.0–17.0)
MCH: 31.8 pg (ref 26.0–34.0)
MCHC: 30.8 g/dL (ref 30.0–36.0)
MCV: 103.1 fL — AB (ref 78.0–100.0)
PLATELETS: 97 10*3/uL — AB (ref 150–400)
RBC: 2.58 MIL/uL — AB (ref 4.22–5.81)
RDW: 13.9 % (ref 11.5–15.5)
WBC: 4.7 10*3/uL (ref 4.0–10.5)

## 2015-01-22 MED ORDER — BESIFLOXACIN HCL 0.6 % OP SUSP
1.0000 [drp] | Freq: Three times a day (TID) | OPHTHALMIC | Status: DC
  Administered 2015-01-22: 1 [drp] via OPHTHALMIC
  Filled 2015-01-22: qty 5

## 2015-01-22 MED ORDER — CARBOXYMETHYLCELLUL-GLYCERIN 0.5-0.9 % OP SOLN
1.0000 [drp] | OPHTHALMIC | Status: DC
  Administered 2015-01-22 (×2): 1 [drp] via OPHTHALMIC
  Filled 2015-01-22: qty 1

## 2015-01-22 MED ORDER — ACETAMINOPHEN-CODEINE #3 300-30 MG PO TABS: 1.0000 | ORAL_TABLET | ORAL | Status: AC | PRN

## 2015-01-22 MED ORDER — NAPROXEN 500 MG PO TABS: 500.0000 mg | ORAL_TABLET | Freq: Two times a day (BID) | ORAL | Status: AC

## 2015-01-22 NOTE — Care Management Note (Signed)
Case Management Note  Patient Details  Name: Brandon Hardy MRN: [id number] Date of Birth: 1927/02/23  Subjective/Objective:  Pt medically stable for dc today.                    Action/Plan: Pt discharging to SNF today, per CSW arrangments.    Expected Discharge Date:     01/22/2015             Expected Discharge Plan:  Skilled Nursing Facility  In-House Referral:  Clinical Social Work  Discharge planning Services  CM Consult  Post Acute Care Choice:    Choice offered to:     DME Arranged:    DME Agency:     HH Arranged:    HH Agency:     Status of Service:  Completed, signed off  Medicare Important Message Given:  Yes Date Medicare IM Given:    Medicare IM give by:    Date Additional Medicare IM Given:    Additional Medicare Important Message give by:     If discussed at Long Length of Stay Meetings, dates discussed:    Additional Comments:  Quintella Baton, RN, BSN  Trauma/Neuro ICU Case Manager [phone]

## 2015-01-22 NOTE — Clinical Social Work Placement (Signed)
   CLINICAL SOCIAL WORK PLACEMENT  NOTE  Date:  01/22/2015  Patient Details  Name: Brandon Hardy MRN: [id number] Date of Birth: [birth year]  Clinical Social Work is seeking post-discharge placement for this patient at the Skilled  Nursing Facility level of care (*CSW will initial, date and re-position this form in  chart as items are completed):  Yes   Patient/family provided with [place] Clinical Social Work Department's list of facilities offering this level of care within the geographic area requested by the patient (or if unable, by the patient's family).  Yes   Patient/family informed of their freedom to choose among providers that offer the needed level of care, that participate in Medicare, Medicaid or managed care program needed by the patient, have an available bed and are willing to accept the patient.  Yes   Patient/family informed of [facility]'s ownership interest in [facility] and [facility], as well as of the fact that they are under no obligation to receive care at these facilities.  PASRR submitted to EDS on       PASRR number received on       Existing PASRR number confirmed on 01/21/15     FL2 transmitted to all facilities in geographic area requested by pt/family on 01/21/15     FL2 transmitted to all facilities within larger geographic area on       Patient informed that his/her managed care company has contracts with or will negotiate with certain facilities, including the following:        Yes   Patient/family informed of bed offers received.  Patient chooses bed at [facility]     Physician recommends and patient chooses bed at      Patient to be transferred to [facility] on 01/22/15.  Patient to be transferred to facility by Ambulance     Patient family notified on 01/22/15 of transfer.  Name of family member notified:  Patient son Oda Lansdowne -over the phone)     PHYSICIAN Please sign FL2,  Please prepare priority discharge summary, including medications     Additional Comment:    [name], LCSW [phone]

## 2015-01-22 NOTE — Progress Notes (Signed)
Report called to [facility].  °

## 2015-01-22 NOTE — Care Management Important Message (Signed)
Important Message  Patient Details  Name: Brandon Hardy MRN: [id number] Date of Birth: Jun 18, 1927   Medicare Important Message Given:  Yes    Doshia Dalia P Shad Ledvina 01/22/2015, 11:54 AM

## 2015-01-22 NOTE — Clinical Social Work Note (Signed)
Clinical Social Worker facilitated patient discharge including contacting patient family and facility to confirm patient discharge plans.  Clinical information faxed to facility and family agreeable with plan.  CSW arranged ambulance transport via [facility] to [facility].  RN to call report prior to discharge.  Clinical Social Worker will sign off for now as social work intervention is no longer needed. Please consult us again if new need arises.  Jesse Jaira Canady, LCSW [phone] 
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
## 2015-01-22 NOTE — Progress Notes (Signed)
Patient recently discharged to [facility] via [facility].  Discharged packet, belongings and meds inside plastic bag sent with patient.
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
## 2015-03-03 DEATH — deceased
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
## 2016-06-12 IMAGING — RF DG SWALLOWING FUNCTION - NRPT [facility]
1 series · 18 of 24 positions shown · non-contrast
Comparison: none

[Series 1: run · 21 acquisitions, 18 frames shown]
[im 1/21]
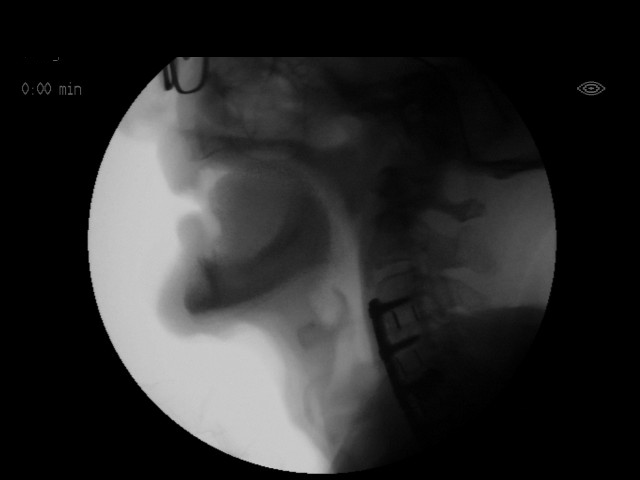
[im 2/21]
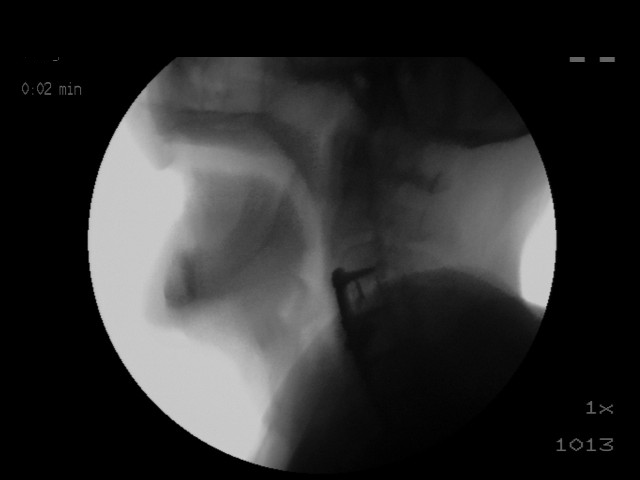
[im 3/21]
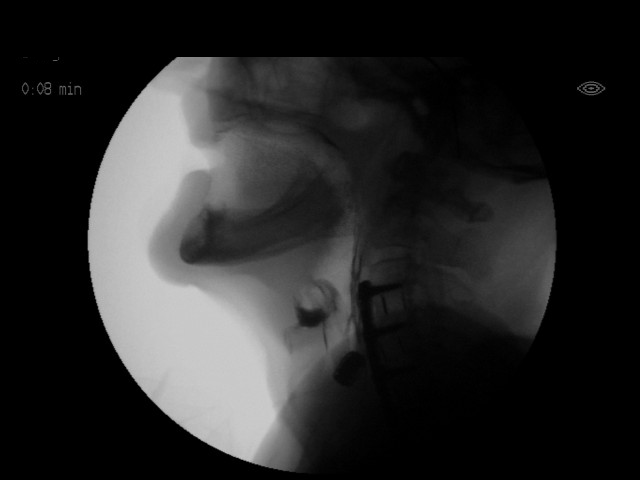
[im 4/21]
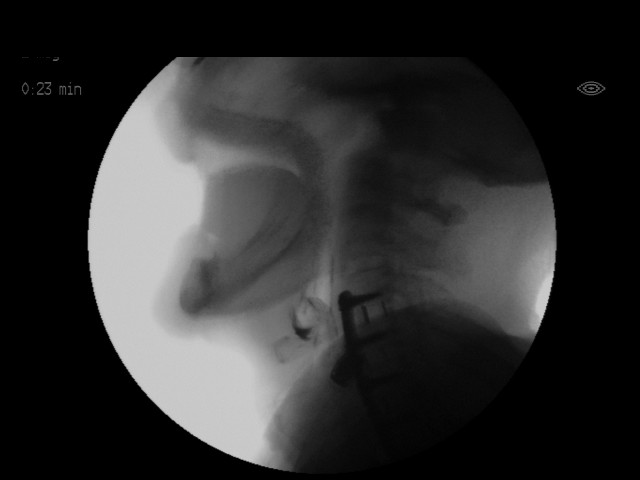
[im 6/21]
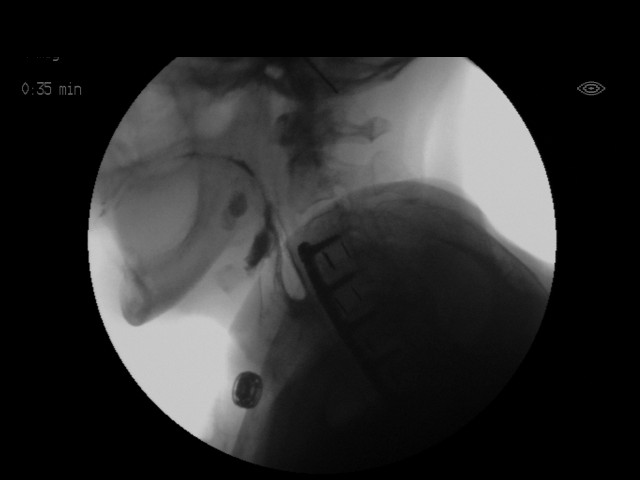
[im 7/21]
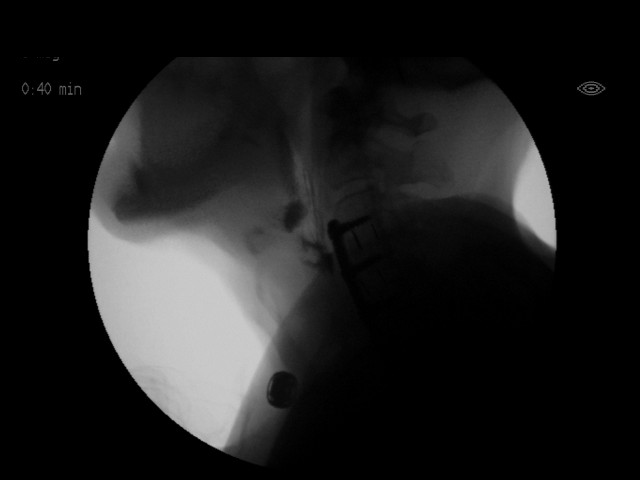
[im 8/21]
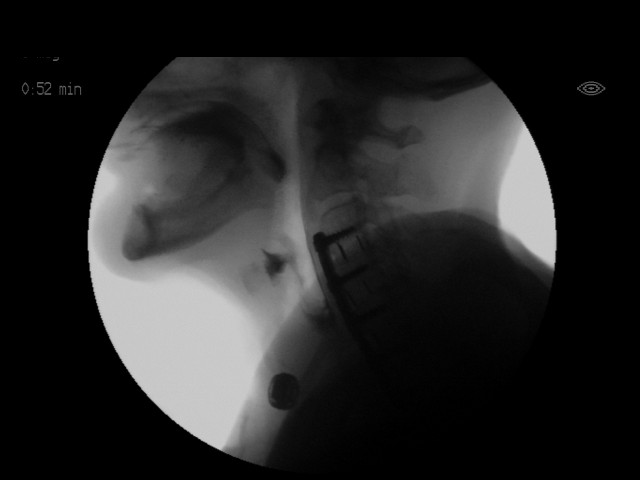
[im 10/21]
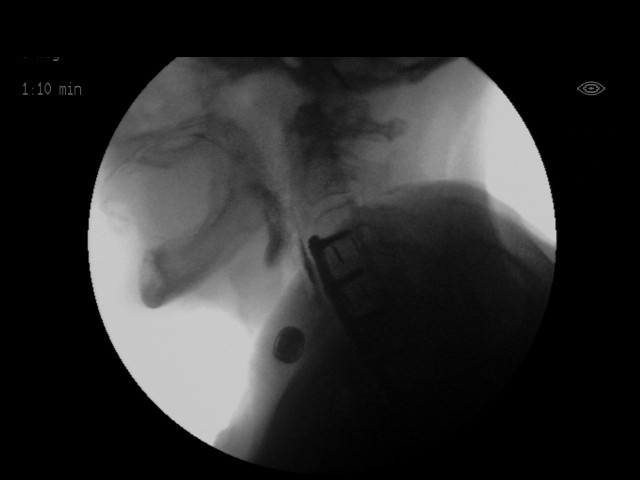
[im 11/21]
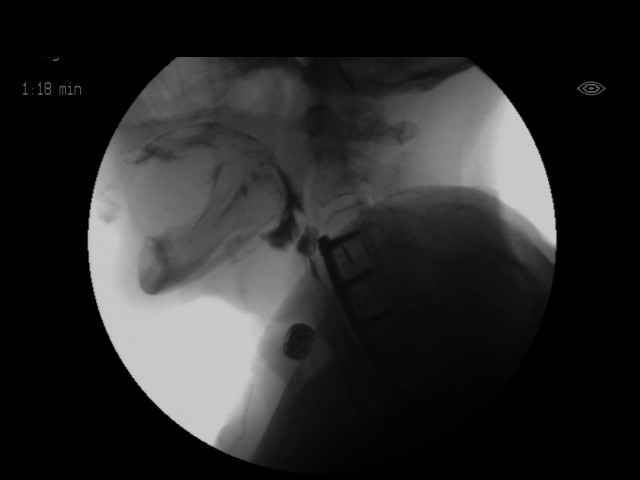
[im 11/21]
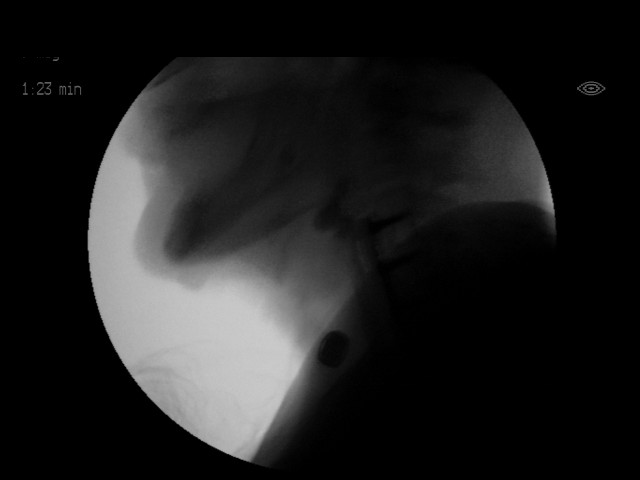
[im 13/21]
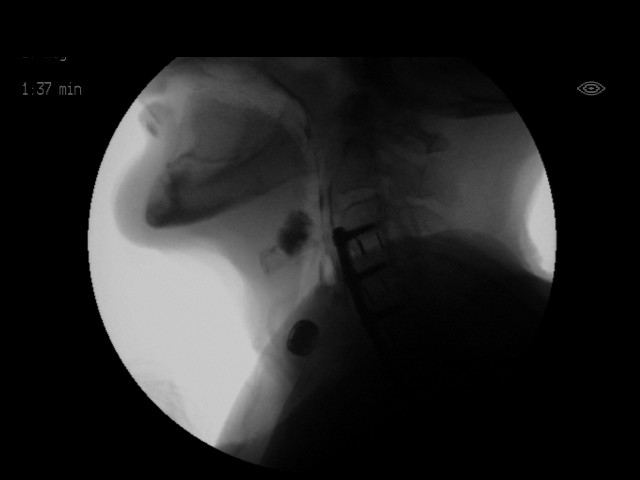
[im 14/21]
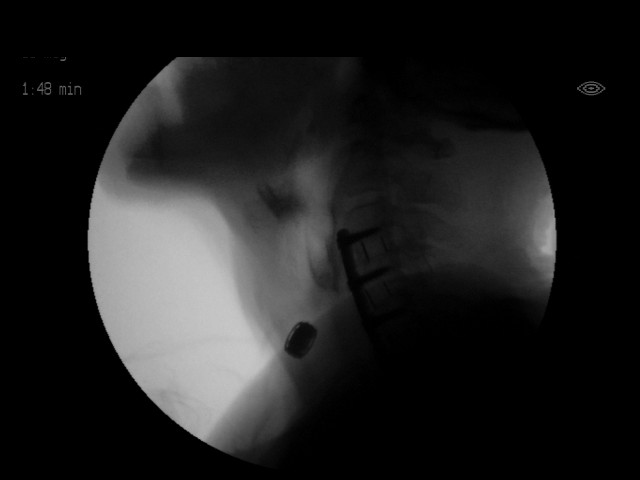
[im 15/21]
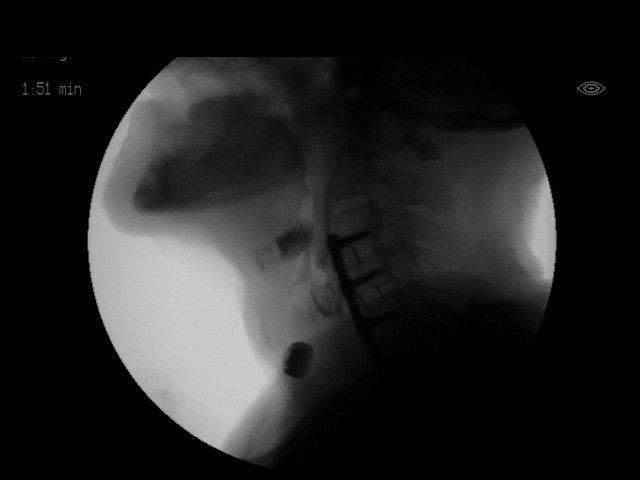
[im 17/21]
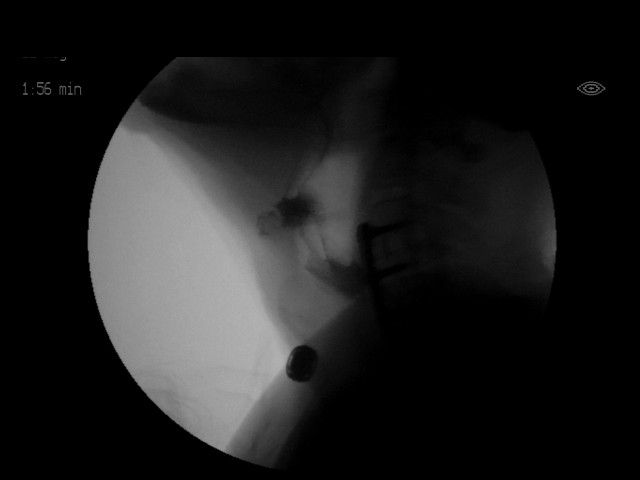
[im 18/21]
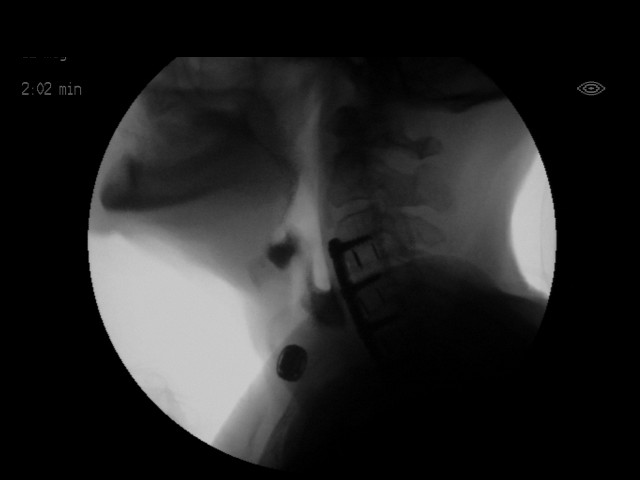
[im 19/21]
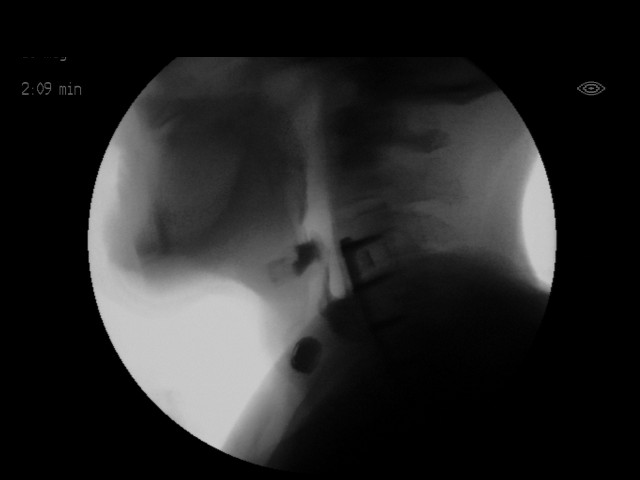
[im 20/21]
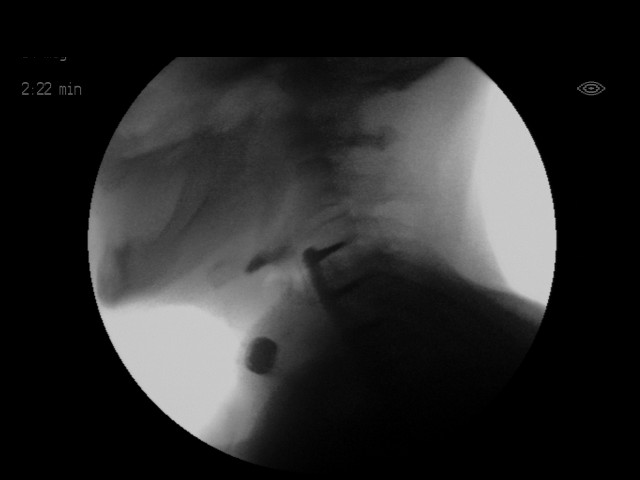
[im 21/21]
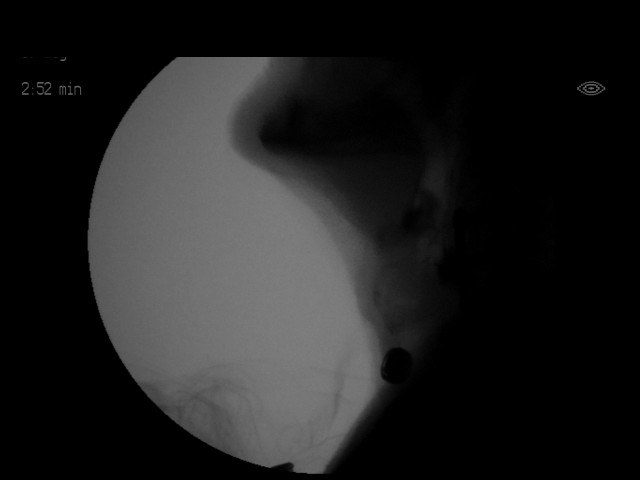

[18 of 24 positions shown; findings below may reference images not displayed]

FLUOROSCOPY FOR SWALLOWING FUNCTION STUDY:
Fluoroscopy was provided for swallowing function study, which was administered by a speech pathologist.  Final results and recommendations from this study are contained within the speech pathology report.
